# Patient Record
Sex: Female | Born: 1985 | Race: Black or African American | Hispanic: No | Smoking: Current every day smoker
Health system: Southern US, Community
[De-identification: ages and names within clinical notes are randomized; demographics above are authoritative.]

## PROBLEM LIST (undated history)

## (undated) ENCOUNTER — Inpatient Hospital Stay (HOSPITAL_COMMUNITY): Payer: Self-pay

## (undated) DIAGNOSIS — E282 Polycystic ovarian syndrome: Secondary | ICD-10-CM

## (undated) DIAGNOSIS — F32A Depression, unspecified: Secondary | ICD-10-CM

## (undated) DIAGNOSIS — G43909 Migraine, unspecified, not intractable, without status migrainosus: Secondary | ICD-10-CM

## (undated) DIAGNOSIS — F329 Major depressive disorder, single episode, unspecified: Secondary | ICD-10-CM

## (undated) DIAGNOSIS — A749 Chlamydial infection, unspecified: Secondary | ICD-10-CM

## (undated) DIAGNOSIS — R51 Headache: Secondary | ICD-10-CM

## (undated) DIAGNOSIS — Z720 Tobacco use: Secondary | ICD-10-CM

## (undated) DIAGNOSIS — N200 Calculus of kidney: Secondary | ICD-10-CM

## (undated) DIAGNOSIS — B999 Unspecified infectious disease: Secondary | ICD-10-CM

## (undated) DIAGNOSIS — G35 Multiple sclerosis: Secondary | ICD-10-CM

## (undated) DIAGNOSIS — M1990 Unspecified osteoarthritis, unspecified site: Secondary | ICD-10-CM

## (undated) DIAGNOSIS — B019 Varicella without complication: Secondary | ICD-10-CM

## (undated) DIAGNOSIS — R519 Headache, unspecified: Secondary | ICD-10-CM

## (undated) DIAGNOSIS — G629 Polyneuropathy, unspecified: Secondary | ICD-10-CM

## (undated) DIAGNOSIS — S8292XA Unspecified fracture of left lower leg, initial encounter for closed fracture: Secondary | ICD-10-CM

## (undated) HISTORY — DX: Polyneuropathy, unspecified: G62.9

## (undated) HISTORY — DX: Major depressive disorder, single episode, unspecified: F32.9

## (undated) HISTORY — DX: Migraine, unspecified, not intractable, without status migrainosus: G43.909

## (undated) HISTORY — PX: TIBIA FRACTURE SURGERY: SHX806

## (undated) HISTORY — DX: Chlamydial infection, unspecified: A74.9

## (undated) HISTORY — DX: Unspecified osteoarthritis, unspecified site: M19.90

## (undated) HISTORY — DX: Polycystic ovarian syndrome: E28.2

## (undated) HISTORY — DX: Multiple sclerosis: G35

## (undated) HISTORY — DX: Depression, unspecified: F32.A

## (undated) HISTORY — DX: Headache, unspecified: R51.9

## (undated) HISTORY — DX: Varicella without complication: B01.9

## (undated) HISTORY — DX: Headache: R51

---
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
## 2006-12-01 DIAGNOSIS — R519 Headache, unspecified: Secondary | ICD-10-CM | POA: Insufficient documentation

## 2006-12-01 DIAGNOSIS — J309 Allergic rhinitis, unspecified: Secondary | ICD-10-CM | POA: Insufficient documentation

## 2006-12-01 DIAGNOSIS — R51 Headache: Secondary | ICD-10-CM

## 2007-06-14 ENCOUNTER — Emergency Department (HOSPITAL_COMMUNITY): Admission: EM | Admit: 2007-06-14 | Discharge: 2007-06-15 | Payer: Self-pay | Admitting: Emergency Medicine

## 2007-10-02 ENCOUNTER — Emergency Department (HOSPITAL_COMMUNITY): Admission: EM | Admit: 2007-10-02 | Discharge: 2007-10-02 | Payer: Self-pay | Admitting: Emergency Medicine

## 2007-10-05 DIAGNOSIS — S8292XA Unspecified fracture of left lower leg, initial encounter for closed fracture: Secondary | ICD-10-CM

## 2007-10-05 HISTORY — DX: Unspecified fracture of left lower leg, initial encounter for closed fracture: S82.92XA

## 2008-06-11 ENCOUNTER — Emergency Department (HOSPITAL_COMMUNITY): Admission: EM | Admit: 2008-06-11 | Discharge: 2008-06-11 | Payer: Self-pay | Admitting: Emergency Medicine

## 2008-07-25 ENCOUNTER — Encounter: Payer: Self-pay | Admitting: Obstetrics and Gynecology

## 2008-07-25 ENCOUNTER — Ambulatory Visit: Payer: Self-pay | Admitting: Obstetrics and Gynecology

## 2008-07-25 LAB — CONVERTED CEMR LAB
Hgb A1c MFr Bld: 6.5 % — ABNORMAL HIGH (ref 4.6–6.1)
Prolactin: 3.9 ng/mL
Sex Hormone Binding: 16 nmol/L — ABNORMAL LOW (ref 18–114)
Testosterone Free: 18.4 pg/mL — ABNORMAL HIGH (ref 0.6–6.8)
WBC, Wet Prep HPF POC: NONE SEEN
hCG, Beta Chain, Quant, S: <2 milliintl units/mL

## 2008-07-29 ENCOUNTER — Ambulatory Visit (HOSPITAL_COMMUNITY): Admission: RE | Admit: 2008-07-29 | Discharge: 2008-07-29 | Payer: Self-pay | Admitting: Obstetrics & Gynecology

## 2008-08-05 ENCOUNTER — Emergency Department (HOSPITAL_COMMUNITY): Admission: EM | Admit: 2008-08-05 | Discharge: 2008-08-05 | Payer: Self-pay | Admitting: Family Medicine

## 2008-08-15 ENCOUNTER — Ambulatory Visit: Payer: Self-pay | Admitting: Obstetrics and Gynecology

## 2008-08-15 ENCOUNTER — Other Ambulatory Visit: Admission: RE | Admit: 2008-08-15 | Discharge: 2008-08-15 | Payer: Self-pay | Admitting: Obstetrics and Gynecology

## 2008-08-16 ENCOUNTER — Inpatient Hospital Stay (HOSPITAL_COMMUNITY): Admission: EM | Admit: 2008-08-16 | Discharge: 2008-08-19 | Payer: Self-pay | Admitting: Emergency Medicine

## 2008-08-28 ENCOUNTER — Ambulatory Visit: Payer: Self-pay | Admitting: Obstetrics & Gynecology

## 2008-09-24 ENCOUNTER — Encounter: Admission: RE | Admit: 2008-09-24 | Discharge: 2008-10-03 | Payer: Self-pay | Admitting: Orthopedic Surgery

## 2008-10-08 ENCOUNTER — Encounter (HOSPITAL_BASED_OUTPATIENT_CLINIC_OR_DEPARTMENT_OTHER): Admission: RE | Admit: 2008-10-08 | Discharge: 2008-12-05 | Payer: Self-pay | Admitting: Internal Medicine

## 2008-10-08 ENCOUNTER — Encounter: Admission: RE | Admit: 2008-10-08 | Discharge: 2009-01-06 | Payer: Self-pay | Admitting: Orthopedic Surgery

## 2009-01-03 ENCOUNTER — Emergency Department (HOSPITAL_COMMUNITY): Admission: EM | Admit: 2009-01-03 | Discharge: 2009-01-03 | Payer: Self-pay | Admitting: *Deleted

## 2009-03-05 ENCOUNTER — Ambulatory Visit: Payer: Self-pay | Admitting: Obstetrics and Gynecology

## 2009-03-30 ENCOUNTER — Emergency Department (HOSPITAL_COMMUNITY): Admission: EM | Admit: 2009-03-30 | Discharge: 2009-03-31 | Payer: Self-pay | Admitting: Emergency Medicine

## 2009-06-18 ENCOUNTER — Ambulatory Visit: Payer: Self-pay | Admitting: Obstetrics and Gynecology

## 2009-07-24 ENCOUNTER — Ambulatory Visit: Payer: Self-pay | Admitting: Obstetrics and Gynecology

## 2009-10-04 ENCOUNTER — Emergency Department (HOSPITAL_COMMUNITY): Admission: EM | Admit: 2009-10-04 | Discharge: 2009-10-04 | Payer: Self-pay | Admitting: Emergency Medicine

## 2010-11-03 ENCOUNTER — Emergency Department (HOSPITAL_COMMUNITY)
Admission: EM | Admit: 2010-11-03 | Discharge: 2010-11-03 | Payer: Self-pay | Source: Home / Self Care | Admitting: Emergency Medicine

## 2010-11-04 DIAGNOSIS — G44209 Tension-type headache, unspecified, not intractable: Secondary | ICD-10-CM

## 2010-12-19 LAB — GLUCOSE, CAPILLARY: Glucose-Capillary: 117 mg/dL — ABNORMAL HIGH (ref 70–99)

## 2011-01-12 ENCOUNTER — Emergency Department (HOSPITAL_COMMUNITY)
Admission: EM | Admit: 2011-01-12 | Discharge: 2011-01-12 | Disposition: A | Discharge status: Home / Self Care | Payer: Self-pay | Attending: Emergency Medicine | Admitting: Emergency Medicine

## 2011-01-12 DIAGNOSIS — J029 Acute pharyngitis, unspecified: Secondary | ICD-10-CM | POA: Insufficient documentation

## 2011-01-12 DIAGNOSIS — R079 Chest pain, unspecified: Secondary | ICD-10-CM | POA: Insufficient documentation

## 2011-01-12 DIAGNOSIS — E669 Obesity, unspecified: Secondary | ICD-10-CM | POA: Insufficient documentation

## 2011-01-12 DIAGNOSIS — J309 Allergic rhinitis, unspecified: Secondary | ICD-10-CM | POA: Insufficient documentation

## 2011-01-12 DIAGNOSIS — J3489 Other specified disorders of nose and nasal sinuses: Secondary | ICD-10-CM | POA: Insufficient documentation

## 2011-01-12 LAB — RAPID STREP SCREEN (MED CTR MEBANE ONLY): Streptococcus, Group A Screen (Direct): NEGATIVE

## 2011-02-16 NOTE — H&P (Signed)
Samantha [name], Samantha [name]             ACCOUNT NO.:  [id number]   MEDICAL RECORD NO.:  [id number]          PATIENT TYPE:  INP   LOCATION:  5029                         FACILITY:  [facility]   PHYSICIAN:  Alvy Beal, MD    DATE OF BIRTH:  07-Mar-1986   DATE OF ADMISSION:  08/16/2008  DATE OF DISCHARGE:                              HISTORY & PHYSICAL   ADMISSION DIAGNOSIS:  Left ankle fracture.   HISTORY:  This is a very pleasant 25 year old obese African American  woman who lost her footing and fell and presents with severe left ankle  pain.  The patient denied any loss of consciousness, blurry vision, or  headaches prior to or following the fall.  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  She now has pain at 7/10 and is quite  severe.  There is no obvious laceration, abrasion, or contusion about  the ankle region.  She states that about a week and half earlier she was  complaining of left knee pain and has not significantly changed.   PAST MEDICAL, SURGICAL, FAMILY, AND SOCIAL HISTORY:  No known drug  allergies.  She is currently taking metformin 100 mg p.o. 3 times a day.  She has a history of hypertension and diabetes.  She is a nondrinker and  nondrug abuser, but she smokes a few cigarettes daily.   She is not pregnant.  She indicates there is no way that she could be  pregnant.   CLINICAL PHYSICAL EXAM:  GENERAL:  She is a pleasant woman who appears  her stated age in no acute distress.  She is alert and oriented x3.  No  shortness of breath or chest pain.  LUNGS:  Fields are clear.  HEART:  Regular rate and rhythm.  ABDOMEN:  Soft and nontender.  No rebound or tenderness.  RIGHT LOWER EXTREMITY:  Asymptomatic.  Full range of motion.  No gross  crepitus, deformity, or pain.  No hip or knee pain with palpation or  range of motion.  She has significant tenderness and swelling about the  medial malleolus and midportion of the fibula.  NEUROVASCULAR:  Intact with  palpable and intact pulses bilaterally.  Cap  refill less than 2 seconds.  Moving all toes.   At this point in time, the patient's left lower extremity is placed into  a posterior splint, iced and elevated.  X-rays clearly show a medial  malleolar transverse fracture with displacement and oblique fracture the  medial malleolus displacement and mid to distal third fibula fracture.  There is no gross widening of the syndesmosis.   At this point in time, I discussed treatment options with the patient,  which include cast immobilization versus surgery.  I went over the  risks, benefits, and alternatives to both.  After discussing treatment  options, she elected to proceed with surgery.  As I explained to her and  her husband, the risks include infection, bleeding, nerve damage,  death, stroke, paralysis, failure to heal, need for further surgery,  ongoing or worse pain, and nonunion.  All their questions were  addressed.  We will plan on admitting the patient and then at 7:30  tomorrow morning doing an ORIF of the ankle.      Alvy Beal, MD  Electronically Signed     DDB/MEDQ  D:  08/16/2008  T:  08/17/2008  Job:  [id number]

## 2011-02-16 NOTE — Op Note (Signed)
NAMECOREY, CAULFIELD             ACCOUNT NO.:  [id number]   MEDICAL RECORD NO.:  [id number]          PATIENT TYPE:  INP   LOCATION:  5029                         FACILITY:  [facility]   PHYSICIAN:  Alvy Beal, MD    DATE OF BIRTH:  10/24/1985   DATE OF PROCEDURE:  08/17/2008  DATE OF DISCHARGE:                               OPERATIVE REPORT   PREOPERATIVE DIAGNOSIS:  Left bimalleolar ankle fracture.   POSTOPERATIVE DIAGNOSIS:  Left bimalleolar ankle fracture.   OPERATIVE PROCEDURE:  Open reduction and internal fixation of the ankle.   INSTRUMENTATION USED:  Two medial cannulated screws with a washer 48 and  42 mm in length and a 7-hole one-third tubular plate on the lateral side  with 6 cortical screws, and the most inferior screw was used as a  syndesmotic screw, 38 mm in length as there was disruption of  syndesmosis.   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  She was diagnosed with a bimalleolar ankle  fracture, Weber type C.  After discussing treatment options, she elected  to proceed with surgery.  All appropriate risks, benefits, and  alternatives were discussed, and consent was obtained.   OPERATIVE NOTE:  The patient was brought to the operating theater and  placed supine on the operating table.  After successful induction of  general anesthesia and endotracheal intubation, the tourniquet was  placed on the left proximal thigh and a bump was placed underneath the  left buttock.  The leg was appropriately identified.  Preoperative time-  out was done, and the leg was then prepped and draped in a standard  fashion.  A lateral incision was made in line with the fibula, and sharp  dissection was carried out down to the lateral aspect of the fibula.  The fracture was identified.  The enfolded periosteum was removed with a  15 blade, and the hematoma was evacuated with lavage.  I then reduced  the fracture and attempted to use  a bulky plate.  However, I could not  maintain the reduction of the fracture clear and placed a bulky locking  plate.  I then removed this and placed an one-third tubular plate, and I  was able to maintain the reduction and get the plate properly fixed.  I  then secured it to the fibula with 12 and 14-mm cortical screws, which  were all satisfactory.  I had excellent fixation of the lateral side.  However, under direct visualization and under live fluoro, I could move  the fibula superiorly, anteriorly, and posteriorly (in the sagittal  plane), and I could pull it, and she had a positive hook test.  This  collectively let me know that more than likely I would need to repair  the syndesmosis.  However, before doing this, I wanted to repair the  medial malleolus.   I then turned my attention to the medial side.   A reverse hockey stick incision was made, and sharp dissection was  carried out down to the medial malleolus.  I protected the neurovascular  structures and  then identified the fracture site.  I cleaned the  hematoma with a curette, resected the enfolded periosteum with a 15-  blade scalpel.  I then reduced the medial malleolar fragment with a  towel clip and placed 2 guide pins across the fracture site.  I was able  to visualize the anterior joint line and axial, and it had an anatomical  reduction.  I then drilled and placed a 40-mm lag screw with a washer  anteriorly and then a 42-mm screw with a washer posteriorly.  I removed  the clamp, and I had well maintained fixation.  The medial joint line  was anatomically reduced.  X-rays confirmed that the 2 screws on the  medial side were adequately positioned, and they were not  intraarticular.   With the medial fixation complete, I then retested the fibula, and  again, there was still some instability in the sagittal plane and a  positive hook test.  I then removed the most distal screw.  I then  measured from the joint line  proximally 2.5 cm and then placed a  syndesmotic screw across the fibula and into the tibia.  This was 3  cortices.  The port was placed into neutral, slight dorsiflexion during  the insertion, and I did not use a lag screw to prevent overtightening.  Once placed the syndesmotic screw, the fibula was stable.  At this  point, I irrigated both wounds, closed the deep fascia with interrupted  2-0 Vicryl sutures, and superficial with a running 3-0 vertical  mattress.  A dry dressing and bulky splint were applied.  She was  extubated and transferred to the PACU without incident.  At the end of  the case, all needle and sponge counts were correct.      Alvy Beal, MD  Electronically Signed     DDB/MEDQ  D:  08/17/2008  T:  08/17/2008  Job:  [id number]

## 2011-02-16 NOTE — Group Therapy Note (Signed)
Samantha [name], Samantha [name] NO.:  [id number]   MEDICAL RECORD NO.:  [id number]          PATIENT TYPE:  WOC   LOCATION:  [facility]                   FACILITY:  [facility]   PHYSICIAN:  Argentina Donovan, MD        DATE OF BIRTH:  22-Aug-1986   DATE OF SERVICE:                                  CLINIC NOTE   The patient is a 25 year old African American female, nulligravida who  has been attempting for more than a year to get pregnancy.  She weighs  254 pounds.  She is 5 feet 5 inches tall.  Recently, we did an  ultrasound, which revealed a thickened endometrial 1.4 cm.  The patient  had been amenorrheic for some time.  Both ovaries were normal with  exception of many small follicles indicating the possibility of  polycystic ovarian syndrome, so we did an endometrial biopsy sounded the  uterus to 6 cm anteriorly and biopsy was done without incident.  In  addition to this, I have explained the temperature chart to the patient.  She is going to start in about a month with that.  We are placing her on  metformin 500 t.i.d. with meals and after a couple of months on that if  she is not spontaneously ovulating, we will start her on Clomid.  We  have told her that following a pregnancy at the end when and if she gets  pregnant, we would want her to be on oral contraceptives probably for  life.  She smokes irregularly.  I have told her that is going to stop  and she is willing to try and stop smoking.   IMPRESSION:  Polycystic ovarian syndrome, i.e. amenorrhea, polycystic  ovaries and a free testosterone of 18.4, a testosterone of 70.87, a sex  hormone-binding globulin of 16, and hemoglobin A1c of 6.5.  All other  tests were normal.  Glucose intolerance.           ______________________________  Argentina Donovan, MD     PR/MEDQ  D:  08/15/2008  T:  08/16/2008  Job:  [id number]

## 2011-02-16 NOTE — Group Therapy Note (Signed)
NAMEEVONDA, ENGE NO.:  [id number]   MEDICAL RECORD NO.:  [id number]          PATIENT TYPE:  WOC   LOCATION:  [facility]                   FACILITY:  [facility]   PHYSICIAN:  Argentina Donovan, MD        DATE OF BIRTH:  Mar 16, 1986   DATE OF SERVICE:                                  CLINIC NOTE   HISTORY:  The patient is a 25 year old African American female  nulligravida with the same partner 3 years, been married 1 year and has  been attempting more than a year to get pregnant.  She weighs 254  pounds.  She is 5 feet 5 inches tall.  She has a normal blood pressure  of 126/87.  She had an abdominal hirsutism to some degree and she has  been amenorrheic she says for at least 3 years, although she has had  some intermittent spotting.   Today on examination, the abdomen was soft, nontender.  No masses or  organomegaly.  External genitalia was normal.  BUS within normal limits.  Vagina is clean and well rugated.  Cervix is clean, nulliparous and  posterior.  The uterus and adnexa could not be palpated because of the  habitus of the patient.   I have talked to this patient about her condition.  She probably has  polycystic ovarian syndrome.  We are going to get some labs on her and  an ultrasound of the pelvis to look at her endometrium as well as the  ovaries and have her come back in 2 weeks.  Meanwhile, we are going to  give her a progesterone challenge test.  When she comes back, I told her  we would get a temperature chart.  We discussed that after we went over  her laboratories.  Probably get her started on clomiphene citrate which  I described how that works and side effects that could be possible with  that.   IMPRESSION:  Polycystic ovarian syndrome with 3 years of amenorrhea and  hirsutism.           ______________________________  Argentina Donovan, MD     PR/MEDQ  D:  07/25/2008  T:  07/25/2008  Job:  [id number]

## 2011-02-16 NOTE — Assessment & Plan Note (Signed)
[facility]   NAME:  Samantha [name], Samantha [name]             ACCOUNT NO.:  [id number]   MEDICAL RECORD NO.:  [id number]      DATE OF BIRTH:  1986-07-09   PHYSICIAN:  Maxwell Caul, M.D.      VISIT DATE:                                   OFFICE VISIT   IDENTIFICATION:  [name] is a 25 year old woman who fell while doing  housework at home.  Unfortunately, she developed a left bimalleolar  fracture for which she underwent ORIF by Dr. Shon Baton on August 17, 2008.  She tells me that the surgical wound on the lateral aspect of her  ankle healed quite well.  However, the wound on the medial aspect of the  left ankle initially did well, but she has been left with a small  crescent-shaped open area on the medial aspect of the ankle.  This has  not closed.  She did have a course of antibiotics in late November early  December but has had none since.  She is not aware of any cultures.  The  x-rays of the area by Dr. Shon Baton on September 10, 2008, showed good  placement of the hardware, this has not moved or shifted.  At one point,  she was using Silvadene cream on these wounds.  I think more recently  she has simply been using saline gauze.   She does describe some greenish drainage that is episodically coming out  of this wound when she changes it.  She has not been running a fever.  She is not systemically unwell.  She is a diabetic, on metformin but has  no other medical issues that should preclude healing.   PAST MEDICAL HISTORY:  Type 2 diabetes mellitus, on metformin.  No other  issues.   SOCIAL HISTORY:  Lives at home with her husband.   On examination, there is a small crescent-shaped linear wound in the  surgical line on the medial aspect of her ankle.  She measures 1.1 x  0.3, the depth of this would be roughly 0.2.  I did do a light  debridement of the surface eschar of this wound.  I must say I did not  see any evidence of infection.  There was nothing that  looked like it  needed culturing, although I did do a blind culture of the base of this  wound.  Surrounding tissue is somewhat discolored, but I think this is  where the wound was initially.  There was no overt evidence of  infection.  The base of this wound looks fairly healthy to me.   IMPRESSION:  1. Surgical wound, nonhealing in a type 2 diabetic without evidence of      ischemia.  I did a culture of this wound and the debridement as      noted above.  As she is a young woman with really good healing      potential, I simply advised antibacterial soap washes, topical      bacitracin that she will change b.i.d..  We put a dry dressing over      this and an Ace wrap.  She will continue in a Cam walker for      protection of the wound and her ankle healing.  If this is      insufficient, I would consider a collagen-based silver dressing      and/or Oasis.  I am hopeful that this will not be a difficult wound      to close over.           ______________________________  Maxwell Caul, M.D.     MGR/MEDQ  D:  10/11/2008  T:  10/11/2008  Job:  [id number]

## 2011-02-16 NOTE — Assessment & Plan Note (Signed)
[facility]   NAMECALINDA, [name]             ACCOUNT NO.:  [id number]   MEDICAL RECORD NO.:  [id number]      DATE OF BIRTH:  [birth year]   PHYSICIAN:  Lenon Curt. Chilton Si, M.D.   VISIT DATE:  10/18/2008                                   OFFICE VISIT   HISTORY:  A 25 year old female has been [facility] for ulcer that was nonhealing following surgery on the  left malleolar fracture.  The patient has been applying bacitracin and  Ace wrap twice daily.  Wound is doing very well, and she has no  complaints other than mild residual tenderness.  No signs of infection.   PHYSICAL EXAMINATION:  Temp 98.0, pulse 75, respirations 20, blood  pressure 129/81.  Wound measurements are now 0.6 x 0.2 x 0.2 at the left  medial ankle.  Wound is shrinking and is closing up.  No surrounding  erythema.  Minimal tenderness with palpation of the wound.   TREATMENT:  Applied bacitracin and Ace wrap.  The patient will continue  do this daily until full wound healing occurs.  She is discharged from  clinic.  She is advised to continue current dressing changes daily until  fully healed with an estimation this will take about 7-10 days.   ICD-9 CODE:  990.83, nonhealing surgical wound.   CPT CODE:  78295.      Lenon Curt Chilton Si, M.D.  Electronically Signed     AGG/MEDQ  D:  10/18/2008  T:  10/18/2008  Job:  [id number]

## 2011-02-16 NOTE — Group Therapy Note (Signed)
Samantha [name], WOLLENBERG NO.:  [id number]   MEDICAL RECORD NO.:  [id number]          PATIENT TYPE:  WOC   LOCATION:  [facility]                   FACILITY:  [facility]   PHYSICIAN:  Argentina Donovan, MD        DATE OF BIRTH:  1985-12-09   DATE OF SERVICE:  03/05/2009                                  CLINIC NOTE   The patient is a 25 year old nulligravida African American female with  polycystic ovarian syndrome who has been on metformin for 6 months  without having a period.  She had an endometrial biopsy last November  which showed no sign of hyperplasia.  The ultrasound she had was normal.  She weighs 237, is 5 feet 5 inches tall.  I am going to start her on  Clomid and give a prescription for Provera 10 mg b.i.d. for 5 days  followed by 15 mg of Clomid for 5 days, day five through nine of cycle.  She is on a temperature chart which she will be starting up with the  onset of her period.  She will come in if she has not had a period 30  days after taking the Clomid and if her temperature stays up for more  than 20 days above 98 she will get a pregnancy test.   IMPRESSION:  Polycystic ovarian syndrome with amenorrhea, attempting  pregnancy.           ______________________________  Argentina Donovan, MD     PR/MEDQ  D:  03/05/2009  T:  03/05/2009  Job:  [id number]

## 2011-02-16 NOTE — Discharge Summary (Signed)
Samantha [name], Samantha [name]             ACCOUNT NO.:  [id number]   MEDICAL RECORD NO.:  [id number]           PATIENT TYPE:   LOCATION:                                 FACILITY:   PHYSICIAN:  Alvy Beal, MD    DATE OF BIRTH:  03-02-1986   DATE OF ADMISSION:  DATE OF DISCHARGE:                               DISCHARGE SUMMARY   ADMISSION DIAGNOSIS:  Bimalleolar fracture.   DISCHARGE DIAGNOSIS:  Bimalleolar fracture.   PROCEDURE:  Open reduction internal fixation of the tibia and fibula and  open reduction of the ankle dislocation.   BRIEF HISTORY:  Samantha [name] is a very pleasant 25 year old obese African  American female who lost her footing and fell on August 16, 2008, and  presented to the emergency room with a severe left ankle pain.  At the  time of the injury, the patient denied any loss of consciousness,  blurred vision, or headaches prior to or following the fall.  As she was  mopping at work, slipped on the floor, and fell.  She now has pain at  approximately 7/10 and it was quite severe.  There was no obvious  laceration, abrasion, or contusion about the ankle region.  In the  emergency room, radiographs were obtained that demonstrated a  bimalleolar fracture, and therefore, Dr. Shon Baton was consulted for  further orthopedic management.  After a long discussion with the patient  and the husband that they elected to proceed with surgery.   HOSPITAL COURSE:  The patient's hospital course was approximately 3 days  in length.  The patient was brought back to the operating room on  August 17, 2008, to undergo the left bimalleolar open reduction and  internal fixation of her ankle.  The patient tolerated the procedure  very well and was transferred from the OR to the PACU without incident  and furthermore up to the orthopedic floor.  The patient postoperatively  on day #1 was working well with physical therapy.  She was ambulating on  crutches.  Her pain was well controlled  throughout her stay.  She had no  calf tenderness throughout her stay.  She had no shortness of breath and  no chest pain throughout her stay.  Postoperatively on day #2, hospital  stay #3, the patient was doing very well and deemed stable to go home.  She is tolerating a regular diet and at that time was fit for a Cam  walker boot and instructed on nonweightbearing ambulation with the use  of crutches.   The patient's discharge condition is stable.   DISPOSITION:  The patient is discharged to home with a home health aide,  however, I felt from poor financial reasons, the patient was unable to  afford the home health aide.  The patient was instructed on the use of  crutches without putting weight on her left leg.  The patient's  discharge medications included Percocet 5/325 one tablet p.o. q.6 h. as  needed for pain, Robaxin 500 mg 1 tablet p.o. q.6-8 h. p.r.n. muscle  spasm, and the patient is to restart her home medications,  which  included metformin 500 mg t.i.d. and the patient was instructed not to  take her over-the-counter Motrin as needed until she follows up with Dr.  Shon Baton.   DISCHARGE INSTRUCTIONS:  Again, the patient is to remain  nonweightbearing on her left side.  She is to ambulate with the use of  crutches or a walker.  The patient is to maintain on regular diet, and  the patient is to call the office if her dressing gets wet at all.   FOLLOWUP:  The patient is to follow up with Dr. Shon Baton in the office in  approximately 2 weeks.  The patient is to call and schedule her  appointment at [phone].      Crissie Reese, PA      Alvy Beal, MD  Electronically Signed    AC/MEDQ  D:  10/08/2008  T:  10/09/2008  Job:  [id number]

## 2011-04-06 ENCOUNTER — Emergency Department (HOSPITAL_COMMUNITY)
Admission: EM | Admit: 2011-04-06 | Discharge: 2011-04-06 | Disposition: A | Discharge status: Home / Self Care | Payer: No Typology Code available for payment source | Attending: Emergency Medicine | Admitting: Emergency Medicine

## 2011-04-06 ENCOUNTER — Emergency Department (HOSPITAL_COMMUNITY): Payer: No Typology Code available for payment source

## 2011-04-06 DIAGNOSIS — F29 Unspecified psychosis not due to a substance or known physiological condition: Secondary | ICD-10-CM | POA: Insufficient documentation

## 2011-04-06 DIAGNOSIS — R112 Nausea with vomiting, unspecified: Secondary | ICD-10-CM | POA: Insufficient documentation

## 2011-04-06 DIAGNOSIS — N200 Calculus of kidney: Secondary | ICD-10-CM | POA: Insufficient documentation

## 2011-04-06 DIAGNOSIS — Z79899 Other long term (current) drug therapy: Secondary | ICD-10-CM | POA: Insufficient documentation

## 2011-04-06 DIAGNOSIS — R109 Unspecified abdominal pain: Secondary | ICD-10-CM | POA: Insufficient documentation

## 2011-04-06 LAB — URINALYSIS, ROUTINE W REFLEX MICROSCOPIC
Glucose, UA: NEGATIVE mg/dL
Leukocytes, UA: NEGATIVE
Specific Gravity, Urine: 1.031 — ABNORMAL HIGH (ref 1.005–1.030)
pH: 5 (ref 5.0–8.0)

## 2011-04-06 LAB — CBC
HCT: 37.4 % (ref 36.0–46.0)
Hemoglobin: 12.8 g/dL (ref 12.0–15.0)
MCHC: 34.2 g/dL (ref 30.0–36.0)
RBC: 4.58 MIL/uL (ref 3.87–5.11)
WBC: 9.9 10*3/uL (ref 4.0–10.5)

## 2011-04-06 LAB — BASIC METABOLIC PANEL
CO2: 25 mEq/L (ref 19–32)
Chloride: 101 mEq/L (ref 96–112)
Glucose, Bld: 145 mg/dL — ABNORMAL HIGH (ref 70–99)
Potassium: 3.6 mEq/L (ref 3.5–5.1)
Sodium: 137 mEq/L (ref 135–145)

## 2011-04-06 LAB — HEPATIC FUNCTION PANEL
Bilirubin, Direct: <0.1 mg/dL (ref 0.0–0.3)
Total Protein: 8.4 g/dL — ABNORMAL HIGH (ref 6.0–8.3)

## 2011-04-06 LAB — URINE MICROSCOPIC-ADD ON

## 2011-04-06 LAB — LIPASE, BLOOD: Lipase: 19 U/L (ref 11–59)

## 2011-04-06 LAB — DIFFERENTIAL
Basophils Absolute: 0.1 10*3/uL (ref 0.0–0.1)
Lymphocytes Relative: 18 % (ref 12–46)
Monocytes Absolute: 0.5 10*3/uL (ref 0.1–1.0)
Neutro Abs: 7.4 10*3/uL (ref 1.7–7.7)

## 2011-05-08 ENCOUNTER — Inpatient Hospital Stay (INDEPENDENT_AMBULATORY_CARE_PROVIDER_SITE_OTHER)
Admission: RE | Admit: 2011-05-08 | Discharge: 2011-05-08 | Disposition: A | Discharge status: Home / Self Care | Payer: No Typology Code available for payment source | Source: Ambulatory Visit | Attending: Emergency Medicine | Admitting: Emergency Medicine

## 2011-05-08 DIAGNOSIS — M79609 Pain in unspecified limb: Secondary | ICD-10-CM

## 2011-06-08 ENCOUNTER — Inpatient Hospital Stay (INDEPENDENT_AMBULATORY_CARE_PROVIDER_SITE_OTHER)
Admission: RE | Admit: 2011-06-08 | Discharge: 2011-06-08 | Disposition: A | Discharge status: Home / Self Care | Payer: No Typology Code available for payment source | Source: Ambulatory Visit | Attending: Emergency Medicine | Admitting: Emergency Medicine

## 2011-06-08 DIAGNOSIS — J018 Other acute sinusitis: Secondary | ICD-10-CM

## 2011-07-06 LAB — POCT CARDIAC MARKERS
CKMB, poc: 1.2
Myoglobin, poc: 154
Troponin i, poc: <0.05

## 2011-07-06 LAB — POCT I-STAT, CHEM 8
Hemoglobin: 12.6
Potassium: 3.8
Sodium: 140
TCO2: 25

## 2011-07-06 LAB — POCT PREGNANCY, URINE: Preg Test, Ur: NEGATIVE

## 2011-07-06 LAB — DIFFERENTIAL
Basophils Absolute: 0
Lymphocytes Relative: 10 — ABNORMAL LOW
Lymphs Abs: 1.1
Monocytes Absolute: 0.5
Neutro Abs: 8.7 — ABNORMAL HIGH

## 2011-07-06 LAB — GLUCOSE, CAPILLARY
Glucose-Capillary: 116 — ABNORMAL HIGH
Glucose-Capillary: 123 — ABNORMAL HIGH
Glucose-Capillary: 124 — ABNORMAL HIGH
Glucose-Capillary: 139 — ABNORMAL HIGH

## 2011-07-06 LAB — CBC
Hemoglobin: 11.9 — ABNORMAL LOW
RBC: 4.23
RDW: 14.6

## 2011-07-06 LAB — PROTIME-INR: Prothrombin Time: 14

## 2011-07-06 LAB — APTT: aPTT: 27

## 2011-07-07 LAB — POCT URINALYSIS DIP (DEVICE)
Bilirubin Urine: NEGATIVE
Glucose, UA: NEGATIVE
Nitrite: NEGATIVE
Operator id: 235561
pH: 6.5

## 2011-07-07 LAB — POCT PREGNANCY, URINE: Preg Test, Ur: NEGATIVE

## 2011-07-16 LAB — CBC
Hemoglobin: 14
RDW: 14.7 — ABNORMAL HIGH

## 2011-07-16 LAB — DIFFERENTIAL
Basophils Absolute: 0
Lymphocytes Relative: 29
Monocytes Absolute: 0.4
Neutro Abs: 5.1

## 2011-07-16 LAB — URINALYSIS, ROUTINE W REFLEX MICROSCOPIC
Nitrite: NEGATIVE
Specific Gravity, Urine: 1.031 — ABNORMAL HIGH
pH: 6.5

## 2011-07-16 LAB — I-STAT 8, (EC8 V) (CONVERTED LAB)
Acid-Base Excess: 4 — ABNORMAL HIGH
Bicarbonate: 29.6 — ABNORMAL HIGH
HCT: 48 — ABNORMAL HIGH
Operator id: 277751
pCO2, Ven: 47.9

## 2011-07-16 LAB — POCT PREGNANCY, URINE
Operator id: 277751
Preg Test, Ur: NEGATIVE

## 2011-09-14 ENCOUNTER — Encounter: Payer: Self-pay | Admitting: Emergency Medicine

## 2011-09-14 ENCOUNTER — Emergency Department (HOSPITAL_COMMUNITY)
Admission: EM | Admit: 2011-09-14 | Discharge: 2011-09-14 | Disposition: A | Discharge status: Home / Self Care | Payer: No Typology Code available for payment source | Attending: Emergency Medicine | Admitting: Emergency Medicine

## 2011-09-14 DIAGNOSIS — S139XXA Sprain of joints and ligaments of unspecified parts of neck, initial encounter: Secondary | ICD-10-CM | POA: Insufficient documentation

## 2011-09-14 DIAGNOSIS — M542 Cervicalgia: Secondary | ICD-10-CM | POA: Insufficient documentation

## 2011-09-14 DIAGNOSIS — S161XXA Strain of muscle, fascia and tendon at neck level, initial encounter: Secondary | ICD-10-CM

## 2011-09-14 DIAGNOSIS — X500XXA Overexertion from strenuous movement or load, initial encounter: Secondary | ICD-10-CM | POA: Insufficient documentation

## 2011-09-14 MED ORDER — DIAZEPAM 5 MG PO TABS
5.0000 mg | ORAL_TABLET | Freq: Once | ORAL | Status: AC
  Administered 2011-09-14: 5 mg via ORAL
  Filled 2011-09-14: qty 1

## 2011-09-14 MED ORDER — IBUPROFEN 400 MG PO TABS: 400.0000 mg | ORAL_TABLET | Freq: Three times a day (TID) | ORAL | Status: AC | PRN

## 2011-09-14 MED ORDER — IBUPROFEN 800 MG PO TABS
800.0000 mg | ORAL_TABLET | Freq: Once | ORAL | Status: AC
  Administered 2011-09-14: 800 mg via ORAL
  Filled 2011-09-14: qty 1

## 2011-09-14 MED ORDER — HYDROCODONE-ACETAMINOPHEN 5-325 MG PO TABS
1.0000 | ORAL_TABLET | Freq: Once | ORAL | Status: AC
  Administered 2011-09-14: 1 via ORAL
  Filled 2011-09-14: qty 1

## 2011-09-14 MED ORDER — DIAZEPAM 5 MG PO TABS: 5.0000 mg | ORAL_TABLET | Freq: Three times a day (TID) | ORAL | Status: AC | PRN

## 2011-09-14 MED ORDER — HYDROCODONE-ACETAMINOPHEN 5-325 MG PO TABS: 1.0000 | ORAL_TABLET | Freq: Four times a day (QID) | ORAL | Status: AC | PRN

## 2011-09-14 NOTE — ED Provider Notes (Signed)
History     CSN: [id number] Arrival date & time: 09/14/2011  8:06 PM   First MD Initiated Contact with Patient 09/14/11 2035      Chief Complaint  Patient presents with  . Neck Pain     Patient is a 25 y.o. female presenting with neck pain. The history is provided by the patient.  Neck Pain  This is a new problem. The current episode started 6 to 12 hours ago. The problem occurs constantly. The problem has been gradually worsening. The pain is associated with twisting. There has been no fever. The pain is present in the left side. The pain radiates to the left shoulder.  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 States she had her head turned to the (R) while drying hair and had the sudden sharp pain. States since that time (L) neck is very TTP and it hurts to turn her neck to the (L). Denies any neurological symptoms.    History reviewed. No pertinent past medical history.  Past Surgical History  Procedure Date  . Tibia fracture surgery     Family History  Problem Relation Age of Onset  . Hypertension Other   . Diabetes Other     History  Substance Use Topics  . Smoking status: Former Games developer  . Smokeless tobacco: Not on file  . Alcohol Use: No    OB History    Grav Para Term Preterm Abortions TAB SAB Ect Mult Living                  Review of Systems  Constitutional: Negative.   HENT: Positive for neck pain.   Eyes: Negative.   Respiratory: Negative.   Cardiovascular: Negative.   Gastrointestinal: Negative.   Genitourinary: Negative.   Skin: Negative.   Neurological: Negative.   Hematological: Negative.   Psychiatric/Behavioral: Negative.     Allergies  Review of patient's allergies indicates no known allergies.  Home Medications  No current outpatient prescriptions on file.  BP 124/83  Pulse 91  Temp(Src) 98.2 F (36.8 C) (Oral)  Resp 16  SpO2 98%  LMP 09/07/2011  Physical Exam   Constitutional: She is oriented to person, place, and time. She appears well-developed and well-nourished.  HENT:  Head: Normocephalic and atraumatic.  Eyes: Conjunctivae are normal.  Neck: Neck supple. Muscular tenderness present. No spinous process tenderness present.    Cardiovascular: Normal rate and regular rhythm.   Pulmonary/Chest: Effort normal and breath sounds normal.  Abdominal: Soft. Bowel sounds are normal.  Musculoskeletal: Normal range of motion.  Neurological: She is alert and oriented to person, place, and time. She has normal strength. No cranial nerve deficit.  Skin: Skin is warm and dry.  Psychiatric: She has a normal mood and affect.    ED Course  Procedures Impression discussed w/ pt. Will d/c home w/ tx for cervical strain and encourage f/u w/ PCP if pain persist. Pt is agreeable w/ plan.  Labs Reviewed - No data to display No results found.   No diagnosis found.    MDM  HPI and PE c/w cervical muscle strain.        Leanne Chang, NP 09/14/11 2125

## 2011-09-14 NOTE — ED Notes (Signed)
Pt states she was drying her hair and got a sharp pain in her neck  Pt states pain was worse this morning but has gotten better throughout the day  Pt states when she uses her right hand it makes the pain worse  Pt states the pain is on the left side of her neck

## 2011-09-15 NOTE — ED Provider Notes (Signed)
Medical screening examination/treatment/procedure(s) were performed by non-physician practitioner and as supervising physician I was immediately available for consultation/collaboration.   Dallas Torok, MD 09/15/11 0054 

## 2011-10-13 ENCOUNTER — Ambulatory Visit (INDEPENDENT_AMBULATORY_CARE_PROVIDER_SITE_OTHER): Payer: No Typology Code available for payment source | Admitting: Obstetrics & Gynecology

## 2011-10-13 ENCOUNTER — Encounter: Payer: Self-pay | Admitting: Obstetrics & Gynecology

## 2011-10-13 VITALS — BP 149/103 | HR 101 | Temp 96.6°F | Resp 20 | Ht 65.0 in | Wt 255.0 lb

## 2011-10-13 DIAGNOSIS — Z0000 Encounter for general adult medical examination without abnormal findings: Secondary | ICD-10-CM

## 2011-10-13 DIAGNOSIS — E282 Polycystic ovarian syndrome: Secondary | ICD-10-CM

## 2011-10-13 DIAGNOSIS — Z23 Encounter for immunization: Secondary | ICD-10-CM

## 2011-10-13 DIAGNOSIS — N938 Other specified abnormal uterine and vaginal bleeding: Secondary | ICD-10-CM

## 2011-10-13 DIAGNOSIS — N949 Unspecified condition associated with female genital organs and menstrual cycle: Secondary | ICD-10-CM

## 2011-10-13 MED ORDER — METFORMIN HCL 500 MG PO TABS: ORAL_TABLET | ORAL | Status: DC

## 2011-10-13 NOTE — Progress Notes (Signed)
Addended by: Allie Bossier on: 10/13/2011 04:22 PM   Modules accepted: Orders

## 2011-10-13 NOTE — Progress Notes (Signed)
Subjective:    Samantha [name] is a 26 y.o. female who presents for an annual exam. The patient has complaints consistent with PCOS- unwanted hair growth, DUB, difficulty conceiving. The patient is sexually active. GYN screening history: normal pap in 2009. The patient wears seatbelts: yes. The patient participates in regular exercise: no. Has the patient ever been transfused or tattooed?: not asked. The patient reports that there is not domestic violence in her life.   Menstrual History: OB History    Grav Para Term Preterm Abortions TAB SAB Ect Mult Living   0               Menarche age: Patient's last menstrual period was 09/07/2011.    The following portions of the patient's history were reviewed and updated as appropriate: allergies, current medications, past family history, past medical history, past social history, past surgical history and problem list.  Review of Systems A comprehensive review of systems was negative.  She works part time in childcare. She complains of dysparunia. She has been having unprotected intercourse for 5 years with her husband.   Objective:    BP 149/103  Pulse 101  Temp(Src) 96.6 F (35.9 C) (Oral)  Resp 20  Ht 5\' 5"  (1.651 m)  Wt 255 lb (115.667 kg)  BMI 42.43 kg/m2  LMP 09/07/2011  General Appearance:    Alert, cooperative, no distress, appears stated age  Head:    Normocephalic, without obvious abnormality, atraumatic  Eyes:    PERRL, conjunctiva/corneas clear, EOM's intact, fundi    benign, both eyes  Ears:    Normal TM's and external ear canals, both ears  Nose:   Nares normal, septum midline, mucosa normal, no drainage    or sinus tenderness  Throat:   Lips, mucosa, and tongue normal; teeth and gums normal  Neck:   Supple, symmetrical, trachea midline, no adenopathy;    thyroid:  no enlargement/tenderness/nodules; no carotid   bruit or JVD  Back:     Symmetric, no curvature, ROM normal, no CVA tenderness  Lungs:     Clear to  auscultation bilaterally, respirations unlabored  Chest Wall:    No tenderness or deformity   Heart:    Regular rate and rhythm, S1 and S2 normal, no murmur, rub   or gallop  Breast Exam:    No tenderness, masses, or nipple abnormality  Abdomen:     Soft, non-tender, bowel sounds active all four quadrants,    no masses, no organomegaly  Genitalia:    Normal female without lesion, discharge or tenderness, normal exam     Extremities:   Extremities normal, atraumatic, no cyanosis or edema  Pulses:   2+ and symmetric all extremities  Skin:   Skin color, texture, turgor normal, no rashes or lesions  Lymph nodes:   Cervical, supraclavicular, and axillary nodes normal  Neurologic:   CNII-XII intact, normal strength, sensation and reflexes    throughout  .    Assessment:    Healthy female exam.  Probable DM Probable PCOS   Plan:     Pap smear.  I'm also ordering labs for eval of PCOS, and cervical cultures I will start glucophage and get to 500 mg TID She will also come back fasting in 2 months for fasting lipids

## 2011-10-26 ENCOUNTER — Telehealth: Payer: Self-pay | Admitting: *Deleted

## 2011-10-26 ENCOUNTER — Ambulatory Visit: Payer: Self-pay

## 2011-10-26 DIAGNOSIS — A749 Chlamydial infection, unspecified: Secondary | ICD-10-CM

## 2011-10-26 NOTE — Telephone Encounter (Signed)
Called pt to inform her of test results and need for treatment. She stated that she is here at the clinic. No further instructions given.

## 2011-10-26 NOTE — Telephone Encounter (Signed)
Message copied by Jill Side on Tue Oct 26, 2011 10:56 AM ------      Message from: Allie Bossier      Created: Tue Oct 26, 2011  9:09 AM       She needs zithromax 1 gram po once and her partner(s) need to go to the health dept for their treatment. She will need a test of cure in 1-2 months.

## 2011-12-02 ENCOUNTER — Ambulatory Visit (INDEPENDENT_AMBULATORY_CARE_PROVIDER_SITE_OTHER): Payer: Self-pay | Admitting: Physician Assistant

## 2011-12-02 ENCOUNTER — Encounter: Payer: Self-pay | Admitting: Physician Assistant

## 2011-12-02 DIAGNOSIS — Z0000 Encounter for general adult medical examination without abnormal findings: Secondary | ICD-10-CM

## 2011-12-02 DIAGNOSIS — A749 Chlamydial infection, unspecified: Secondary | ICD-10-CM

## 2011-12-02 DIAGNOSIS — Z01419 Encounter for gynecological examination (general) (routine) without abnormal findings: Secondary | ICD-10-CM

## 2011-12-02 DIAGNOSIS — A7489 Other chlamydial diseases: Secondary | ICD-10-CM

## 2011-12-02 DIAGNOSIS — Z202 Contact with and (suspected) exposure to infections with a predominantly sexual mode of transmission: Secondary | ICD-10-CM

## 2011-12-02 DIAGNOSIS — Z23 Encounter for immunization: Secondary | ICD-10-CM

## 2011-12-02 HISTORY — DX: Chlamydial infection, unspecified: A74.9

## 2011-12-02 LAB — POCT PREGNANCY, URINE: Preg Test, Ur: NEGATIVE

## 2011-12-02 MED ORDER — INFLUENZA VIRUS VACC SPLIT PF IM SUSP
0.5000 mL | INTRAMUSCULAR | Status: AC
  Administered 2011-12-02: 0.5 mL via INTRAMUSCULAR

## 2011-12-02 MED ORDER — HPV QUADRIVALENT VACCINE IM SUSP
0.5000 mL | Freq: Once | INTRAMUSCULAR | Status: AC
  Administered 2011-12-02: 0.5 mL via INTRAMUSCULAR

## 2011-12-02 NOTE — Patient Instructions (Signed)
Human Papillomavirus (HPV) Gardasil Vaccine What You Need to Know WHAT IS HPV?  Genital human papillomavirus (HPV) is the most common sexually transmitted virus in the [place]. More than half of sexually active men and women are infected with HPV at some time in their lives.   About 20 million Americans are currently infected, and about 6 million more get infected each year. HPV is usually spread through sexual contact.   Most HPV infections do not cause any symptoms and go away on their own. But HPV can cause cervical cancer in women. Cervical cancer is the 2nd leading cause of cancer deaths among women around the world. In the [place], about 12,000 women get cervical cancer every year and about 4,000 are expected to die from it.   HPV is also associated with several less common cancers, such as vaginal and vulvar cancers in women, and anal and oropharyngeal (back of the throat, including base of tongue and tonsils) cancers in both men and women. HPV can also cause genital warts and warts in the throat.   There is no cure for HPV infection, but some of the problems it causes can be treated.  HPV VACCINE: WHY GET VACCINATED?  The HPV vaccine you are getting is 1 of 2 vaccines that can be given to prevent HPV. It may be given to both males and females.   This vaccine can prevent most cases of cervical cancer in females, if it is given before exposure to the virus. In addition, it can prevent vaginal and vulvar cancer in females, and genital warts and anal cancer in both males and females.   Protection from HPV vaccine is expected to be long-lasting. But vaccination is not a substitute for cervical cancer screening. Women should still get regular Pap tests.  WHO SHOULD GET THIS HPV VACCINE AND WHEN? HPV vaccine is given as a 3-dose series.  1st Dose: Now.   2nd Dose: 1 to 2 months after Dose 1.   3rd Dose: 6 months after Dose 1.  Additional (booster) doses are not  recommended Routine Vaccination This HPV vaccine is recommended for girls and boys 25 or 25 years of age. It may be given starting at age 25. Why is HPV vaccine recommended at 5 or 25 years of age?  HPV infection is easily acquired, even with only one sex partner. That is why it is important to get HPV vaccine before any sexual conact takes place. Also, response to the vaccine is better at this age than at older ages. Catch-Up Vaccination This vaccine is recommended for the following people who have not completed the 3-dose series:   Females 25 through 25 years of age.   Males 13 through 25 years of age.  This vaccine may be given to men 25 through 25 years of age who have not completed the 3-dose series. It is recommended for men through age 25 who have sex with men or whose immune system is weakened because of HIV infection, other illness, or medications.  HPV vaccine may be given at the same time as other vaccines. SOME PEOPLE SHOULD NOT GET HPV VACCINE OR SHOULD WAIT  Anyone who has ever had a life-threatening allergic reaction to any other component of HPV vaccine, or to a previous dose of HPV vaccine, should not get the vaccine. Tell your doctor if the person getting vaccinated has any severe allergies, including an allergy to yeast.   HPV vaccine is not recommended for pregnant women. However,  receiving HPV vaccine when pregnant is not a reason to consider terminating the pregnancy. Women who are breastfeeding may get the vaccine.   Any woman who learns she was pregnant when she got this HPV vaccine is encouraged to contact the manufacturer's HPV-in-pregnancy registry at [phone]. This will help [place] learn more about how pregnant women respond to the vaccine.   People who are mildly ill when a dose of HPV is planned can still be vaccinated. People with a moderate or severe illness should wait until they are better.  WHAT ARE THE RISKS FROM THIS VACCINE?  This HPV vaccine has been  used in the [place] and around the world for about 6 years and has been very safe.   However, any medicine could possibly cause a serious problem, such as a severe allergic reaction. The risk of any vaccine causing a serious injury, or death, is extremely small.   Life-threatening allergic reactions from vaccines are very rare. If they do occur, it would be within a few minutes to a few hours after the vaccination.  Several mild to moderate problems are known to occur with HPV vaccine. These do not last long and go away on their own.  Reactions in the arm where the shot was given:   Pain (about 8 people in 10).   Redness or swelling (about 1 person in 4).   Fever:   Mild (100 F or 37.8 C) (about 1 person in 10).   Moderate (102 F or 38.9 C) (about 1 person in 59).   Other problems:   Headache (about 1 person in 3).   Fainting: Brief fainting spells and related symptoms (such as jerking movements) can happen after any medical procedure, including vaccination. Sitting or lying down for about 15 minutes after a vaccination can help prevent fainting and injuries caused by falls. Tell your doctor if the patient feels dizzy or lightheaded, or has vision changes or ringing in the ears.   Like all vaccines, HPV vaccine will continue to be monitored for unusual or severe problems.  WHAT IF THERE IS A MODERATE OR SEVERE REACTION? What should I look for?  Any unusual condition, such as a high fever or unusual behavior. Signs of a serious allergic reaction can include difficulty breathing, hoarseness or wheezing, hives, paleness, weakness, a fast heartbeat, or dizziness.  What should I do?  Call a doctor, or get the person to a doctor right away.   Tell your doctor what happened, the date and time it happened, and when the vaccination was given.   Ask your doctor, nurse, or health department to report the reaction by filing a Vaccine Adverse Event Reporting System (VAERS) form. Or, you can  file this report through the VAERS website at www.vaers.LAgents.no or by calling 1-628 412 6170.  VAERS does not provide medical advice. THE NATIONAL VACCINE INJURY COMPENSATION PROGRAM  The National Vaccine Injury Compensation Program (VICP) was created in 1986.   Persons who believe they may have been injured by a vaccine can learn about the program and about filing a claim by calling 1-8781425381 or visiting the VICP website at SpiritualWord.at  HOW CAN I LEARN MORE?  Ask your doctor. They can give you the vaccine package insert or suggest other sources of information.   Call your local or state health department.   Contact the Centers for Disease Control and Prevention (CDC):   Call 705-199-0064 (1-800-CDC-INFO)  or   Visit CDC's website at PicCapture.uy  CDC Human Papillomavirus (HPV) Gardasil (  Interim) 11/25/10 Document Released: 07/18/2006 Document Revised: 06/08/2011 Document Reviewed: 01/25/2009 [facility] Patient Information 2012 [place], [place].Influenza Facts Flu (influenza) is a contagious respiratory illness caused by the influenza viruses. It can cause mild to severe illness. While most healthy people recover from the flu without specific treatment and without complications, older people, young children, and people with certain health conditions are at higher risk for serious complications from the flu, including death. CAUSES   The flu virus is spread from person to person by respiratory droplets from coughing and sneezing.   A person can also become infected by touching an object or surface with a virus on it and then touching their mouth, eye or nose.   Adults may be able to infect others from 1 day before symptoms occur and up to 7 days after getting sick. So it is possible to give someone the flu even before you know you are sick and continue to infect others while you are sick.  SYMPTOMS   Fever (usually high).   Headache.   Tiredness (can  be extreme).   Cough.   Sore throat.   Runny or stuffy nose.   Body aches.   Diarrhea and vomiting may also occur, particularly in children.   These symptoms are referred to as "flu-like symptoms". A lot of different illnesses, including the common cold, can have similar symptoms.  DIAGNOSIS   There are tests that can determine if you have the flu as long you are tested within the first 2 or 3 days of illness.   A doctor's exam and additional tests may be needed to identify if you have a disease that is a complicating the flu.  RISKS AND COMPLICATIONS  Some of the complications caused by the flu include:  Bacterial pneumonia or progressive pneumonia caused by the flu virus.   Loss of body fluids (dehydration).   Worsening of chronic medical conditions, such as heart failure, asthma, or diabetes.   Sinus problems and ear infections.  HOME CARE INSTRUCTIONS   Seek medical care early on.   If you are at high risk from complications of the flu, consult your health-care provider as soon as you develop flu-like symptoms. Those at high risk for complications include:   People 65 years or older.   People with chronic medical conditions, including diabetes.   Pregnant women.   Young children.   Your caregiver may recommend use of an antiviral medication to help treat the flu.   If you get the flu, get plenty of rest, drink a lot of liquids, and avoid using alcohol and tobacco.   You can take over-the-counter medications to relieve the symptoms of the flu if your caregiver approves. (Never give aspirin to children or teenagers who have flu-like symptoms, particularly fever).  PREVENTION  The single best way to prevent the flu is to get a flu vaccine each fall. Other measures that can help protect against the flu are:  Antiviral Medications   A number of antiviral drugs are approved for use in preventing the flu. These are prescription medications, and a doctor should be  consulted before they are used.   Habits for Good Health   Cover your nose and mouth with a tissue when you cough or sneeze, throw the tissue away after you use it.   Wash your hands often with soap and water, especially after you cough or sneeze. If you are not near water, use an alcohol-based hand cleaner.   Avoid people who are  sick.   If you get the flu, stay home from work or school. Avoid contact with other people so that you do not make them sick, too.   Try not to touch your eyes, nose, or mouth as germs ore often spread this way.  IN CHILDREN, EMERGENCY WARNING SIGNS THAT NEED URGENT MEDICAL ATTENTION:  Fast breathing or trouble breathing.   Bluish skin color.   Not drinking enough fluids.   Not waking up or not interacting.   Being so irritable that the child does not want to be held.   Flu-like symptoms improve but then return with fever and worse cough.   Fever with a rash.  IN ADULTS, EMERGENCY WARNING SIGNS THAT NEED URGENT MEDICAL ATTENTION:  Difficulty breathing or shortness of breath.   Pain or pressure in the chest or abdomen.   Sudden dizziness.   Confusion.   Severe or persistent vomiting.  SEEK IMMEDIATE MEDICAL CARE IF:  You or someone you know is experiencing any of the symptoms above. When you arrive at the emergency center,report that you think you have the flu. You may be asked to wear a mask and/or sit in a secluded area to protect others from getting sick. MAKE SURE YOU:   Understand these instructions.   Monitor your condition.   Seek medical care if you are getting worse, or not improving.  Document Released: 09/23/2003 Document Revised: 06/02/2011 Document Reviewed: 06/19/2009 [facility] Patient Information 2012 [place], [place].Safer Sex Your caregiver wants you to have this information about the infections that can be transmitted from sexual contact and how to prevent them. The idea behind safer sex is that you can be sexually active,  and at the same time reduce the risk of giving or getting a sexually transmitted disease (STD). Every person should be aware of how to prevent him or herself and his or her sex partner from getting an STD. CAUSES OF STDS STDs are transmitted by sharing body fluids, which contain viruses and bacteria. The following fluids all transmit infections during sexual intercourse and sex acts:  Semen.   Saliva.   Urine.   Blood.   Vaginal mucus.  Examples of STDs include:  Chlamydia.   Gonorrhea.   Genital herpes.   Hepatitis B.   Human immunodeficiency virus or acquired immunodeficiency syndrome (HIV or AIDS).   Syphilis.   Trichomonas.   Pubic lice.   Human papillomavirus (HPV), which may include:   Genital warts.   Cervical dysplasia.   Cervical cancer (can develop with certain types of HPV).  SYMPTOMS  Sexual diseases often cause few or no symptoms until they are advanced, so a person can be infected and spread the infection without knowing it. Some STDs respond to treatment very well. Others, like HIV and herpes, cannot be cured, but are treated to reduce their effects. Specific symptoms include:  Abnormal vaginal discharge.   Irritation or itching in and around the vagina, and in the pubic hair.   Pain during sexual intercourse.   Bleeding during sexual intercourse.   Pelvic or abdominal pain.   Fever.   Growths in and around the vagina.   An ulcer in or around the vagina.   Swollen glands in the groin area.  DIAGNOSIS   Blood tests.   Pap test.   Culture test of abnormal vaginal discharge.   A test that applies a solution and examines the cervix with a lighted magnifying scope (colposcopy).   A test that examines the pelvis with a  lighted tube, through a small incision (laparoscopy).  TREATMENT  The treatment will depend on the cause of the STD.  Antibiotic treatment by injection, oral, creams, or suppositories in the vagina.   Over-the-counter  medicated shampoo, to get rid of pubic lice.   Removing or treating growths with medicine, freezing, burning (electrocautery), or surgery.   Surgery treatment for HPV of the cervix.   Supportive medicines for herpes, HIV, AIDS, and hepatitis.  Being careful cannot eliminate all risk of infection, but sex can be made much safer. Safe sexual practices include body massage and gentle touching. Masturbation is safe, as long as body fluids do not contact skin that has sores or cuts. Dry kissing and oral sex on a man wearing a latex condom or on a woman wearing a female condom is also safe. Slightly less safe is intercourse while the man wears a latex condom or wet kissing. It is also safer to have one sex partner that you know is not having sex with anyone else. LENGTH OF ILLNESS An STD might be treated and cured in a week, sometimes a month, or more. And it can linger with symptoms for many years. STDs can also cause damage to the female organs. This can cause chronic pain, infertility, and recurrence of the STD, especially herpes, hepatitis, HIV, and HPV. HOME CARE INSTRUCTIONS AND PREVENTION  Alcohol and recreational drugs are often the reason given for not practicing safer sex. These substances affect your judgment. Alcohol and recreational drugs can also impair your immune system, making you more vulnerable to disease.   Do not engage in risky and dangerous sexual practices, including:   Vaginal or anal sex without a condom.   Oral sex on a man without a condom.   Oral sex on a woman without a female condom.   Using saliva to lubricate a condom.   Any other sexual contact in which body fluids or blood from one partner contact the other partner.   You should use only latex condoms for men and water soluble lubricants. Petroleum based lubricants or oils used to lubricate a condom will weaken the condom and increase the chance that it will break.   Think very carefully before having sex  with anyone who is high risk for STDs and HIV. This includes IV drug users, people with multiple sexual partners, or people who have had an STD, or a positive hepatitis or HIV blood test.   Remember that even if your partner has had only one previous partner, their previous partner might have had multiple partners. If so, you are at high risk of being exposed to an STD. You and your sex partner should be the only sex partners with each other, with no one else involved.   A vaccine is available for hepatitis B and HPV through your caregiver or the Public Health Department. Everyone should be vaccinated with these vaccines.   Avoid risky sex practices. Sex acts that can break the skin make you more likely to get an STD.  SEEK MEDICAL CARE IF:   If you think you have an STD, even if you do not have any symptoms. Contact your caregiver for evaluation and treatment, if needed.   You think or know your sex partner has acquired an STD.   You have any of the symptoms mentioned above.  Document Released: 10/28/2004 Document Revised: 06/02/2011 Document Reviewed: 08/20/2009 [facility] Patient Information 2012 [place], [place].

## 2011-12-02 NOTE — Progress Notes (Signed)
Patient here for test of cure for positive chlamydia in January and states she thinks she is due 2nd Gardisil injection. Interested in getting flu shot today

## 2011-12-02 NOTE — Progress Notes (Signed)
Chief Complaint:  Test of cure and Injections   Samantha [name] is  26 y.o. G0P0.  Patient's last menstrual period was 09/14/2011.Marland Kitchen  Her pregnancy status is negative.  She presents complaining of Test of cure and Injections  History of + chlamydia on pap in January. Reports she and partner completed treatment. RTC today for test of cure and to continue Guardisil vaccine and to receive Flu shot.  No complaints  Obstetrical/Gynecological History: OB History    Grav Para Term Preterm Abortions TAB SAB Ect Mult Living   0               Past Medical History: Past Medical History  Diagnosis Date  . PCOS (polycystic ovarian syndrome)     Past Surgical History: Past Surgical History  Procedure Date  . Tibia fracture surgery     Family History: Family History  Problem Relation Age of Onset  . Hypertension Other   . Diabetes Other   . Ulcers Mother   . GER disease Mother   . GER disease Sister   . Asthma Brother     Social History: History  Substance Use Topics  . Smoking status: Current Some Day Smoker -- 0.2 packs/day for 2 years    Types: Cigarettes  . Smokeless tobacco: Never Used  . Alcohol Use: Yes     socially    Allergies: No Known Allergies  Review of Systems - Negative except what has been reviewed in HPI  Physical Exam   Blood pressure 136/92, pulse 92, temperature 98.4 F (36.9 C), temperature source Oral, height 5' 4.5" (1.638 m), weight 252 lb 9.6 oz (114.579 kg), last menstrual period 09/14/2011.  General: General appearance - alert, well appearing, and in no distress, oriented to person, place, and time and obese Focused Gynecological Exam: VULVA: normal appearing vulva with no masses, tenderness or lesions, VAGINA: normal appearing vagina with normal color and discharge, no lesions, CERVIX: scant pink discharge noted at os, no CMT, UTERUS: uterus is normal size, shape, consistency and nontender, ADNEXA: normal adnexa in size, nontender and no  masses    Assessment/Plan:  1. Routine health maintenance  influenza  inactive virus vaccine (FLUZONE/FLUARIX) injection 0.5 mL, HPV vaccine quadrivalent 3 dose IM, hpv vaccine (GARDASIL) injection 0.5 mL  2. Chlamydia contact, treated  GC/chlamydia probe amp, genital      Barb Shear E. 12/02/2011,4:03 PM

## 2011-12-03 LAB — GC/CHLAMYDIA PROBE AMP, GENITAL: GC Probe Amp, Genital: NEGATIVE

## 2011-12-10 ENCOUNTER — Ambulatory Visit: Payer: No Typology Code available for payment source

## 2011-12-10 ENCOUNTER — Ambulatory Visit (INDEPENDENT_AMBULATORY_CARE_PROVIDER_SITE_OTHER): Payer: Self-pay | Admitting: *Deleted

## 2011-12-10 DIAGNOSIS — Z0000 Encounter for general adult medical examination without abnormal findings: Secondary | ICD-10-CM

## 2011-12-10 LAB — LIPID PANEL
Cholesterol: 207 mg/dL — ABNORMAL HIGH (ref 0–200)
HDL: 29 mg/dL — ABNORMAL LOW (ref >39)
Triglycerides: 118 mg/dL (ref <150)

## 2012-02-18 ENCOUNTER — Emergency Department (INDEPENDENT_AMBULATORY_CARE_PROVIDER_SITE_OTHER): Payer: BC Managed Care – PPO

## 2012-02-18 ENCOUNTER — Emergency Department (INDEPENDENT_AMBULATORY_CARE_PROVIDER_SITE_OTHER)
Admission: EM | Admit: 2012-02-18 | Discharge: 2012-02-18 | Disposition: A | Discharge status: Home / Self Care | Payer: BC Managed Care – PPO | Source: Home / Self Care | Attending: Emergency Medicine | Admitting: Emergency Medicine

## 2012-02-18 ENCOUNTER — Encounter (HOSPITAL_COMMUNITY): Payer: Self-pay | Admitting: *Deleted

## 2012-02-18 DIAGNOSIS — S76319A Strain of muscle, fascia and tendon of the posterior muscle group at thigh level, unspecified thigh, initial encounter: Secondary | ICD-10-CM

## 2012-02-18 DIAGNOSIS — M19072 Primary osteoarthritis, left ankle and foot: Secondary | ICD-10-CM

## 2012-02-18 DIAGNOSIS — M19079 Primary osteoarthritis, unspecified ankle and foot: Secondary | ICD-10-CM

## 2012-02-18 DIAGNOSIS — IMO0002 Reserved for concepts with insufficient information to code with codable children: Secondary | ICD-10-CM

## 2012-02-18 MED ORDER — MELOXICAM 15 MG PO TABS: 15.0000 mg | ORAL_TABLET | Freq: Every day | ORAL | Status: DC

## 2012-02-18 MED ORDER — PREDNISONE 5 MG PO KIT: 1.0000 | PACK | Freq: Every day | ORAL | Status: DC
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
## 2012-02-18 NOTE — Discharge Instructions (Signed)
Do stretches--10 repetitions twice daily followed by heat or ice.

## 2012-02-18 NOTE — ED Provider Notes (Signed)
Chief Complaint  Patient presents with  . Leg Pain    History of Present Illness:   The patient is a 26 year old female one-week history of right hamstring pain and left ankle pain. She fractured her left ankle in 2009 and had open reduction and internal fixation done by Dr. Venita Lick. It hasn't bothered her much up until the past week. She denies any injury to the ankle. There's been no swelling, no numbness, tingling, or weakness. She describes pain over both medial and lateral malleolus which is worse when she ambulates or bears weight and better if she rests. The pain in the hamstring occurred at the same time and she thinks this might have been due to the fact that she was favoring her right leg because of the pain the left leg. There is no swelling of the right leg. She does have some pain on straight leg raise but no numbness, tingling, or weakness.  Review of Systems:  Other than noted above, the patient denies any of the following symptoms: Systemic:  No fevers, chills, sweats, or aches.  No fatigue or tiredness. Musculoskeletal:  No joint pain, arthritis, bursitis, swelling, back pain, or neck pain. Neurological:  No muscular weakness, paresthesias, headache, or trouble with speech or coordination.  No dizziness.   PMFSH:  Past medical history, family history, social history, meds, and allergies were reviewed.  Physical Exam:   Vital signs:  BP 137/88  Pulse 102  Temp(Src) 98.4 F (36.9 C) (Oral)  Resp 18  SpO2 100% Gen:  Alert and oriented times 3.  In no distress. Musculoskeletal: There is pain to palpation in the left ankle over both medial and lateral malleolar. There is no swelling. She has limited range of motion of the ankle with pain. There's no pain to palpation over the tibia or over the foot. Pulses are full. There is no edema. Muscle strength and sensation are normal. Exam of the right leg reveals no swelling. No calf tenderness, Homans sign was negative. Pedal pulse  was full. There is no pain to palpation of the ankle the knee or the hip. All joints have a full range of motion, but she does have some pain referred to the hamstring area on straight leg raise. Otherwise, all joints had a full a ROM with no swelling, bruising or deformity.  No edema, pulses full. Extremities were warm and pink.  Capillary refill was brisk.  Skin:  Clear, warm and dry.  No rash. Neuro:  Alert and oriented times 3.  Muscle strength was normal.  Sensation was intact to light touch.   Radiology:  Dg Ankle Complete Left  02/18/2012  *RADIOLOGY REPORT*  Clinical Data: Left ankle pain, fracture 2009 with ORIF  LEFT ANKLE COMPLETE - 3+ VIEW  Comparison: 03/30/2009  Findings: Side plate and screws fixing previously seen distal fibular fracture noted as well as medial malleolar screw.  No evidence for hardware failure.  Fracture line no longer visible. No acute finding.  The mortise is symmetric.  No soft tissue abnormality.  Minimal plantar calcaneal spurring again noted.  IMPRESSION: No acute finding or evidence for hardware failure.  Original Report Authenticated By: Harrel Lemon, M.D.    Assessment:  The primary encounter diagnosis was Hamstring strain. 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  Plan:   1.  The following meds were prescribed:   New Prescriptions   MELOXICAM (MOBIC) 15 MG TABLET    Take 1 tablet (  15 mg total) by mouth daily.   PREDNISONE 5 MG KIT    Take 1 kit (5 mg total) by mouth daily after breakfast. Prednisone 5 mg 6 day dosepack.  Take as directed.   2.  The patient was instructed in symptomatic care, including rest and activity, elevation, application of ice and compression.  Appropriate handouts were given. 3.  The patient was told to return if becoming worse in any way, if no better in 3 or 4 days, and given some red flag symptoms that would indicate earlier return.   4.  The patient was told to follow up with Dr.  Shon Baton. She was given some stretching exercises in the meantime an ASO brace was applied to the left ankle.   Reuben Likes, MD 02/18/12 [phone]

## 2012-02-25 ENCOUNTER — Ambulatory Visit (INDEPENDENT_AMBULATORY_CARE_PROVIDER_SITE_OTHER): Payer: BC Managed Care – PPO

## 2012-03-29 ENCOUNTER — Ambulatory Visit: Payer: BC Managed Care – PPO

## 2012-03-29 ENCOUNTER — Ambulatory Visit (INDEPENDENT_AMBULATORY_CARE_PROVIDER_SITE_OTHER): Payer: BC Managed Care – PPO | Admitting: General Practice

## 2012-03-29 VITALS — BP 126/89 | HR 90 | Temp 98.8°F | Ht 65.0 in | Wt 256.9 lb

## 2012-03-29 DIAGNOSIS — Z23 Encounter for immunization: Secondary | ICD-10-CM

## 2012-10-18 ENCOUNTER — Other Ambulatory Visit: Payer: Self-pay

## 2013-01-26 ENCOUNTER — Encounter (HOSPITAL_COMMUNITY): Payer: Self-pay

## 2013-01-26 ENCOUNTER — Inpatient Hospital Stay (HOSPITAL_COMMUNITY)
Admission: AD | Admit: 2013-01-26 | Discharge: 2013-01-26 | Disposition: A | Discharge status: Home / Self Care | Payer: Commercial Managed Care - PPO | Source: Ambulatory Visit | Attending: Obstetrics and Gynecology | Admitting: Obstetrics and Gynecology

## 2013-01-26 DIAGNOSIS — R109 Unspecified abdominal pain: Secondary | ICD-10-CM

## 2013-01-26 DIAGNOSIS — O9989 Other specified diseases and conditions complicating pregnancy, childbirth and the puerperium: Secondary | ICD-10-CM

## 2013-01-26 DIAGNOSIS — R1011 Right upper quadrant pain: Secondary | ICD-10-CM | POA: Insufficient documentation

## 2013-01-26 DIAGNOSIS — O99891 Other specified diseases and conditions complicating pregnancy: Secondary | ICD-10-CM | POA: Insufficient documentation

## 2013-01-26 LAB — COMPREHENSIVE METABOLIC PANEL
ALT: 22 U/L (ref 0–35)
Alkaline Phosphatase: 78 U/L (ref 39–117)
CO2: 22 mEq/L (ref 19–32)
Calcium: 9.4 mg/dL (ref 8.4–10.5)
GFR calc Af Amer: >90 mL/min (ref >90)
GFR calc non Af Amer: >90 mL/min (ref >90)
Glucose, Bld: 79 mg/dL (ref 70–99)
Sodium: 136 mEq/L (ref 135–145)
Total Bilirubin: 0.3 mg/dL (ref 0.3–1.2)

## 2013-01-26 LAB — URINALYSIS, ROUTINE W REFLEX MICROSCOPIC
Bilirubin Urine: NEGATIVE
Hgb urine dipstick: NEGATIVE
Nitrite: NEGATIVE
Specific Gravity, Urine: 1.02 (ref 1.005–1.030)
pH: 6 (ref 5.0–8.0)

## 2013-01-26 LAB — CBC
Hemoglobin: 11.1 g/dL — ABNORMAL LOW (ref 12.0–15.0)
MCH: 25.6 pg — ABNORMAL LOW (ref 26.0–34.0)
RBC: 4.34 MIL/uL (ref 3.87–5.11)

## 2013-01-26 NOTE — MAU Provider Note (Signed)
History     CSN: [id number]  Arrival date and time: 01/26/13 1444   First Provider Initiated Contact with Patient 01/26/13 1515      Chief Complaint  Patient presents with  . Abdominal Pain   HPI 27 y.o. G1P0 at [redacted]w[redacted]d with sudden onset of RUQ pain about 30 minutes prior to arrival in MAU. Lasted less than 30 minutes. No pain at all now. No fever, bleeding, contractions, nausea/vomiting, constipation or diarrhea.    Past Medical History  Diagnosis Date  . PCOS (polycystic ovarian syndrome)   . Chlamydia 12/02/2011  . Diabetes mellitus     borderline    Past Surgical History  Procedure Laterality Date  . Tibia fracture surgery      Family History  Problem Relation Age of Onset  . Hypertension Other   . Diabetes Other   . Ulcers Mother   . GER disease Mother   . GER disease Sister   . Asthma Brother     History  Substance Use Topics  . Smoking status: Current Some Day Smoker -- 0.50 packs/day for 2 years    Types: Cigarettes  . Smokeless tobacco: Never Used  . Alcohol Use: Yes     Comment: socially none since pregnancy    Allergies: No Known Allergies  No prescriptions prior to admission    Review of Systems  Constitutional: Negative.  Negative for fever, chills and malaise/fatigue.  Respiratory: Negative.   Cardiovascular: Negative.   Gastrointestinal: Positive for abdominal pain. Negative for nausea, vomiting, diarrhea and constipation.  Genitourinary: Negative for dysuria, urgency, frequency, hematuria and flank pain.       Negative for vaginal bleeding, cramping/contractions  Musculoskeletal: Negative.   Neurological: Negative.   Psychiatric/Behavioral: Negative.    Physical Exam   Blood pressure 132/80, pulse 92, temperature 98 F (36.7 C), temperature source Oral, resp. rate 18.  Physical Exam  Nursing note and vitals reviewed. Constitutional: She is oriented to person, place, and time. She appears well-developed and well-nourished. No  distress.  Cardiovascular: Normal rate.   Respiratory: Effort normal.  GI: Soft. She exhibits no distension and no mass. There is no tenderness. There is no rebound and no guarding.  Musculoskeletal: Normal range of motion.  Neurological: She is alert and oriented to person, place, and time.  Skin: Skin is warm and dry.  Psychiatric: She has a normal mood and affect.   TOCO quiet, reassuring FHR at 23 weeks  MAU Course  Procedures Results for orders placed during the hospital encounter of 01/26/13 (from the past 24 hour(s))  URINALYSIS, ROUTINE W REFLEX MICROSCOPIC     Status: Abnormal   Collection Time    01/26/13  3:00 PM      Result Value Range   Color, Urine YELLOW  YELLOW   APPearance CLEAR  CLEAR   Specific Gravity, Urine 1.020  1.005 - 1.030   pH 6.0  5.0 - 8.0   Glucose, UA NEGATIVE  NEGATIVE mg/dL   Hgb urine dipstick NEGATIVE  NEGATIVE   Bilirubin Urine NEGATIVE  NEGATIVE   Ketones, ur 15 (*) NEGATIVE mg/dL   Protein, ur NEGATIVE  NEGATIVE mg/dL   Urobilinogen, UA 0.2  0.0 - 1.0 mg/dL   Nitrite NEGATIVE  NEGATIVE   Leukocytes, UA NEGATIVE  NEGATIVE  CBC     Status: Abnormal   Collection Time    01/26/13  3:16 PM      Result Value Range   WBC 6.8  4.0 - 10.5 K/uL  RBC 4.34  3.87 - 5.11 MIL/uL   Hemoglobin 11.1 (*) 12.0 - 15.0 g/dL   HCT 16.1 (*) 09.6 - 04.5 %   MCV 77.6 (*) 78.0 - 100.0 fL   MCH 25.6 (*) 26.0 - 34.0 pg   MCHC 32.9  30.0 - 36.0 g/dL   RDW 40.9 (*) 81.1 - 91.4 %   Platelets 381  150 - 400 K/uL  COMPREHENSIVE METABOLIC PANEL     Status: Abnormal   Collection Time    01/26/13  3:46 PM      Result Value Range   Sodium 136  135 - 145 mEq/L   Potassium 3.6  3.5 - 5.1 mEq/L   Chloride 102  96 - 112 mEq/L   CO2 22  19 - 32 mEq/L   Glucose, Bld 79  70 - 99 mg/dL   BUN 4 (*) 6 - 23 mg/dL   Creatinine, Ser 7.82  0.50 - 1.10 mg/dL   Calcium 9.4  8.4 - 95.6 mg/dL   Total Protein 6.6  6.0 - 8.3 g/dL   Albumin 2.5 (*) 3.5 - 5.2 g/dL   AST 22  0 - 37  U/L   ALT 22  0 - 35 U/L   Alkaline Phosphatase 78  39 - 117 U/L   Total Bilirubin 0.3  0.3 - 1.2 mg/dL   GFR calc non Af Amer >90  >90 mL/min   GFR calc Af Amer >90  >90 mL/min     Assessment and Plan  27 y.o. G1P0 at [redacted]w[redacted]d with single episode of RUQ pain which has now resolved, no recurrence of pain while in MAU CBC and CMP normal, no other symptoms Call or return with recurrence of pain, otherwise follow up as scheduled, precautions rev'd  Shaylee Stanislawski 01/26/2013, 6:57 PM

## 2013-01-26 NOTE — MAU Note (Signed)
Patient given apple jiuce.

## 2013-01-26 NOTE — MAU Note (Signed)
Onset of sharp on right side of upper abdomen about 30 minutes ago, denies bleeding, no constipation, no dysuria.

## 2013-03-02 LAB — OB RESULTS CONSOLE HIV ANTIBODY (ROUTINE TESTING): HIV: NONREACTIVE

## 2013-03-02 LAB — OB RESULTS CONSOLE RPR: RPR: NONREACTIVE

## 2013-03-02 LAB — OB RESULTS CONSOLE GC/CHLAMYDIA: Gonorrhea: NEGATIVE

## 2013-03-02 LAB — OB RESULTS CONSOLE ABO/RH: RH Type: POSITIVE

## 2013-03-12 ENCOUNTER — Inpatient Hospital Stay (HOSPITAL_COMMUNITY)
Admission: AD | Admit: 2013-03-12 | Discharge: 2013-03-12 | Disposition: A | Discharge status: Home / Self Care | Payer: Commercial Managed Care - PPO | Source: Ambulatory Visit | Attending: Obstetrics and Gynecology | Admitting: Obstetrics and Gynecology

## 2013-03-12 ENCOUNTER — Encounter (HOSPITAL_COMMUNITY): Payer: Self-pay | Admitting: *Deleted

## 2013-03-12 DIAGNOSIS — R42 Dizziness and giddiness: Secondary | ICD-10-CM | POA: Insufficient documentation

## 2013-03-12 HISTORY — DX: Unspecified infectious disease: B99.9

## 2013-03-12 HISTORY — DX: Unspecified fracture of left lower leg, initial encounter for closed fracture: S82.92XA

## 2013-03-12 HISTORY — DX: Calculus of kidney: N20.0

## 2013-03-12 LAB — COMPREHENSIVE METABOLIC PANEL
CO2: 22 mEq/L (ref 19–32)
Calcium: 9.7 mg/dL (ref 8.4–10.5)
Creatinine, Ser: 0.59 mg/dL (ref 0.50–1.10)
GFR calc Af Amer: >90 mL/min (ref >90)
GFR calc non Af Amer: >90 mL/min (ref >90)
Glucose, Bld: 75 mg/dL (ref 70–99)
Total Protein: 7.2 g/dL (ref 6.0–8.3)

## 2013-03-12 LAB — CBC
Hemoglobin: 11.2 g/dL — ABNORMAL LOW (ref 12.0–15.0)
MCH: 25.9 pg — ABNORMAL LOW (ref 26.0–34.0)
MCHC: 32.7 g/dL (ref 30.0–36.0)
MCV: 79.4 fL (ref 78.0–100.0)
RBC: 4.32 MIL/uL (ref 3.87–5.11)

## 2013-03-12 LAB — URINE MICROSCOPIC-ADD ON

## 2013-03-12 LAB — URINALYSIS, ROUTINE W REFLEX MICROSCOPIC
Bilirubin Urine: NEGATIVE
Ketones, ur: >80 mg/dL — AB
Nitrite: NEGATIVE
Protein, ur: NEGATIVE mg/dL
Specific Gravity, Urine: 1.025 (ref 1.005–1.030)
Urobilinogen, UA: 0.2 mg/dL (ref 0.0–1.0)

## 2013-03-12 MED ORDER — DEXTROSE 5 % IN LACTATED RINGERS IV BOLUS
1000.0000 mL | Freq: Once | INTRAVENOUS | Status: AC
  Administered 2013-03-12: 1000 mL via INTRAVENOUS

## 2013-03-12 NOTE — MAU Provider Note (Signed)
History     CSN: [id number]  Arrival date and time: 03/12/13 1628   None     Chief Complaint  Patient presents with  . Dizziness   HPI 27 y.o. G1P0 at [redacted]w[redacted]d with lightheadedness intermittently since last night. Felt like she was going to pass out, but hasn't actually lost consciousness. + fetal movement, no contractions, LOF, bleeding. States she doesn't drink much water, only eats about twice daily.   Past Medical History  Diagnosis Date  . PCOS (polycystic ovarian syndrome)   . Chlamydia 12/02/2011  . Diabetes mellitus     borderline  . Headache(784.0)   . Infection     UTI  . Kidney stone   . Fracture of left leg     Past Surgical History  Procedure Laterality Date  . Tibia fracture surgery      Family History  Problem Relation Age of Onset  . Hypertension Other   . Diabetes Other   . Ulcers Mother   . GER disease Mother   . GER disease Sister   . Asthma Brother     History  Substance Use Topics  . Smoking status: Former Smoker -- 0.50 packs/day for 2 years    Types: Cigarettes  . Smokeless tobacco: Never Used     Comment: quit with preg  . Alcohol Use: No     Comment: socially none since pregnancy    Allergies: No Known Allergies  Prescriptions prior to admission  Medication Sig Dispense Refill  . acetaminophen (TYLENOL) 500 MG tablet Take 1,000 mg by mouth every 6 (six) hours as needed for pain (For headaches.).      Marland Kitchen Prenatal Vit-Fe Fumarate-FA (PRENATAL MULTIVITAMIN) TABS Take 1 tablet by mouth at bedtime.        Review of Systems  Constitutional: Positive for malaise/fatigue. Negative for fever and chills.  Eyes: Negative for blurred vision.  Respiratory: Negative.  Negative for shortness of breath.   Cardiovascular: Negative.  Negative for chest pain.  Gastrointestinal: Negative for nausea, vomiting, abdominal pain, diarrhea and constipation.  Genitourinary: Negative for dysuria, urgency, frequency, hematuria and flank pain.       Negative  for vaginal bleeding, cramping/contractions  Musculoskeletal: Negative.   Neurological: Positive for dizziness. Negative for sensory change, speech change, focal weakness, seizures, loss of consciousness and headaches.  Psychiatric/Behavioral: Negative.    Physical Exam   Blood pressure 119/72, pulse 95, temperature 98.3 F (36.8 C), temperature source Oral, resp. rate 16, SpO2 99.00%.  Physical Exam  Nursing note and vitals reviewed. Constitutional: She is oriented to person, place, and time. She appears well-developed and well-nourished. No distress.  Cardiovascular: Normal rate and regular rhythm.   Respiratory: Effort normal. No respiratory distress.  GI: Soft. There is no tenderness.  Musculoskeletal: Normal range of motion.  Neurological: She is alert and oriented to person, place, and time. No cranial nerve deficit.  Skin: Skin is warm.  Psychiatric: She has a normal mood and affect.    MAU Course  Procedures Results for orders placed during the hospital encounter of 03/12/13 (from the past 72 hour(s))  CBC     Status: Abnormal   Collection Time    03/12/13  4:33 PM      Result Value Range   WBC 7.7  4.0 - 10.5 K/uL   RBC 4.32  3.87 - 5.11 MIL/uL   Hemoglobin 11.2 (*) 12.0 - 15.0 g/dL   HCT 13.0 (*) 86.5 - 78.4 %   MCV 79.4  78.0 - 100.0 fL   MCH 25.9 (*) 26.0 - 34.0 pg   MCHC 32.7  30.0 - 36.0 g/dL   RDW 78.2 (*) 95.6 - 21.3 %   Platelets 335  150 - 400 K/uL  COMPREHENSIVE METABOLIC PANEL     Status: Abnormal   Collection Time    03/12/13  4:33 PM      Result Value Range   Sodium 136  135 - 145 mEq/L   Potassium 3.4 (*) 3.5 - 5.1 mEq/L   Chloride 103  96 - 112 mEq/L   CO2 22  19 - 32 mEq/L   Glucose, Bld 75  70 - 99 mg/dL   BUN 4 (*) 6 - 23 mg/dL   Creatinine, Ser 0.86  0.50 - 1.10 mg/dL   Calcium 9.7  8.4 - 57.8 mg/dL   Total Protein 7.2  6.0 - 8.3 g/dL   Albumin 2.7 (*) 3.5 - 5.2 g/dL   AST 23  0 - 37 U/L   ALT 19  0 - 35 U/L   Alkaline Phosphatase 103   39 - 117 U/L   Total Bilirubin 0.3  0.3 - 1.2 mg/dL   GFR calc non Af Amer >90  >90 mL/min   GFR calc Af Amer >90  >90 mL/min   Comment:            The eGFR has been calculated     using the CKD EPI equation.     This calculation has not been     validated in all clinical     situations.     eGFR's persistently     <90 mL/min signify     possible Chronic Kidney Disease.  URINALYSIS, ROUTINE W REFLEX MICROSCOPIC     Status: Abnormal   Collection Time    03/12/13  5:00 PM      Result Value Range   Color, Urine YELLOW  YELLOW   APPearance CLEAR  CLEAR   Specific Gravity, Urine 1.025  1.005 - 1.030   pH 6.0  5.0 - 8.0   Glucose, UA NEGATIVE  NEGATIVE mg/dL   Hgb urine dipstick TRACE (*) NEGATIVE   Bilirubin Urine NEGATIVE  NEGATIVE   Ketones, ur >80 (*) NEGATIVE mg/dL   Protein, ur NEGATIVE  NEGATIVE mg/dL   Urobilinogen, UA 0.2  0.0 - 1.0 mg/dL   Nitrite NEGATIVE  NEGATIVE   Leukocytes, UA SMALL (*) NEGATIVE  URINE MICROSCOPIC-ADD ON     Status: Abnormal   Collection Time    03/12/13  5:00 PM      Result Value Range   Squamous Epithelial / LPF FEW (*) RARE   WBC, UA 11-20  <3 WBC/hpf   RBC / HPF 3-6  <3 RBC/hpf   Bacteria, UA MANY (*) RARE   Urine-Other MUCOUS PRESENT     Normal orthostatic VS  IV hydration with 1 L bolus D5LR  Assessment and Plan  27 y.o. G1P0 at [redacted]w[redacted]d 1. Episodic lightheadedness   Likely related to not eating and drinking enough in pregnancy - recommended 8-10 8 oz glasses of water daily, eating small frequent meals and snacks Precautions rev'd F/U as scheduled    Medication List    TAKE these medications       acetaminophen 500 MG tablet  Commonly known as:  TYLENOL  Take 1,000 mg by mouth every 6 (six) hours as needed for pain (For headaches.).     prenatal multivitamin Tabs  Take 1 tablet by mouth  at bedtime.            Follow-up Information   Follow up with [facility] (as scheduled)     Contact information:   [address] [place] [address] [phone]        Georges Mouse 03/12/2013, 5:37 PM

## 2013-03-12 NOTE — MAU Note (Signed)
Patient states that she started having dizzy spells yesterday. Today it happened again and she felt like she was going to pass out, but did not. Denies pain, bleeding and leaking and reports good fetal movement.

## 2013-03-12 NOTE — MAU Note (Signed)
Patient is not in the lobby when called to triage.  

## 2013-03-12 NOTE — MAU Note (Signed)
Has been dizzy past few days, felt like she was going to pass out.  Admitted is probably not drinking enough.

## 2013-03-15 LAB — URINE CULTURE

## 2013-03-18 ENCOUNTER — Other Ambulatory Visit: Payer: Self-pay | Admitting: Advanced Practice Midwife

## 2013-03-18 DIAGNOSIS — N390 Urinary tract infection, site not specified: Secondary | ICD-10-CM | POA: Insufficient documentation

## 2013-03-18 MED ORDER — CEPHALEXIN 500 MG PO CAPS: 500.0000 mg | ORAL_CAPSULE | Freq: Four times a day (QID) | ORAL | Status: AC

## 2013-03-20 ENCOUNTER — Telehealth: Payer: Self-pay | Admitting: *Deleted

## 2013-03-20 NOTE — Telephone Encounter (Addendum)
Message copied by Jill Side on Tue Mar 20, 2013  2:27 PM ------      Message from: Aviva Signs      Created: Sun Mar 18, 2013  7:42 PM      Regarding: UTI tx       [facility]us UTI diagnosed 6/9.            I put in an order for Keflex            Can u notify her?            Thanks ------ I called pt and left message that I am calling with test results. Please call us back and indicate whether we can leave details of her test results on her voice mail.

## 2013-03-21 NOTE — Telephone Encounter (Signed)
Patient called and left message stating it is okay to leave results on her voicemail if she doesn't answer. Called patient back and informed her of results and she stated she already got treated for a UTI because she got the Rx from Dr Dareen Piano. Told patient that the results from her MAU must have went to one of our providers who saw her and wasn't aware that she had already been treated. Patient verbalized understanding and had no  Further questions

## 2013-05-03 ENCOUNTER — Encounter (HOSPITAL_COMMUNITY): Payer: Self-pay | Admitting: *Deleted

## 2013-05-03 ENCOUNTER — Other Ambulatory Visit: Payer: Self-pay

## 2013-05-03 ENCOUNTER — Inpatient Hospital Stay (HOSPITAL_COMMUNITY)
Admission: AD | Admit: 2013-05-03 | Discharge: 2013-05-03 | Disposition: A | Discharge status: Home / Self Care | Payer: Commercial Managed Care - PPO | Source: Ambulatory Visit | Attending: Obstetrics and Gynecology | Admitting: Obstetrics and Gynecology

## 2013-05-03 DIAGNOSIS — O479 False labor, unspecified: Secondary | ICD-10-CM | POA: Insufficient documentation

## 2013-05-03 DIAGNOSIS — O26893 Other specified pregnancy related conditions, third trimester: Secondary | ICD-10-CM

## 2013-05-03 DIAGNOSIS — R0602 Shortness of breath: Secondary | ICD-10-CM | POA: Insufficient documentation

## 2013-05-03 DIAGNOSIS — O99891 Other specified diseases and conditions complicating pregnancy: Secondary | ICD-10-CM | POA: Insufficient documentation

## 2013-05-03 MED ORDER — OMEPRAZOLE 20 MG PO CPDR: 20.0000 mg | DELAYED_RELEASE_CAPSULE | Freq: Every day | ORAL | Status: DC

## 2013-05-03 NOTE — MAU Note (Signed)
Patient states she has had shortness of breath when lying down for about one week. States it sometimes happens when being up. Denies pain,bleeding or leaking and reports good fetal movement. Respirations regular and even in triage.

## 2013-05-03 NOTE — MAU Note (Addendum)
Pt states she has been having shortness of breath x 1week. Pt states she feels it when she is laying down

## 2013-05-03 NOTE — MAU Provider Note (Signed)
Chief Complaint:  Shortness of Breath   First Provider Initiated Contact with Patient 05/03/13 1624      HPI: Samantha [name] is a 27 y.o. G1P0 at 62w4dwho presents to maternity admissions reporting SOB when lying down on her side. She describes the SOB as severe enough to cause her to have to get on her hands and knees and try to catch her breath and this happens several times/day.  She currently denies feeling SOB while sitting up in hospital bed.  She reports good fetal movement, denies LOF, vaginal bleeding, vaginal itching/burning, urinary symptoms, h/a, dizziness, n/v, or fever/chills.     Past Medical History: Past Medical History  Diagnosis Date  . PCOS (polycystic ovarian syndrome)   . Chlamydia 12/02/2011  . Diabetes mellitus     borderline  . Headache(784.0)   . Infection     UTI  . Kidney stone   . Fracture of left leg     Past obstetric history: OB History   Grav Para Term Preterm Abortions TAB SAB Ect Mult Living   1              # Outc Date GA Lbr Len/2nd Wgt Sex Del Anes PTL Lv   1 CUR               Past Surgical History: Past Surgical History  Procedure Laterality Date  . Tibia fracture surgery      Family History: Family History  Problem Relation Age of Onset  . Hypertension Other   . Diabetes Other   . Ulcers Mother   . GER disease Mother   . GER disease Sister   . Asthma Brother     Social History: History  Substance Use Topics  . Smoking status: Former Smoker -- 0.50 packs/day for 2 years    Types: Cigarettes  . Smokeless tobacco: Never Used     Comment: quit with preg  . Alcohol Use: No     Comment: socially none since pregnancy    Allergies: No Known Allergies  Meds:  Prescriptions prior to admission  Medication Sig Dispense Refill  . acetaminophen (TYLENOL) 500 MG tablet Take 1,000 mg by mouth every 6 (six) hours as needed for pain (For headaches.).      Marland Kitchen Prenatal Vit-Fe Fumarate-FA (PRENATAL MULTIVITAMIN) TABS Take 1 tablet  by mouth at bedtime.        ROS: Pertinent findings in history of present illness.  Physical Exam  Blood pressure 120/77, pulse 78, temperature 98.3 F (36.8 C), temperature source Oral, resp. rate 16, height 5\' 6"  (1.676 m), weight 110.768 kg (244 lb 3.2 oz), SpO2 100.00%. GENERAL: Well-developed, well-nourished female in no acute distress.  HEENT: normocephalic HEART: normal rate, rhythm, heart sounds RESP: normal effort, lung sounds clear and equal bilaterally in all lobes.   ABDOMEN: Soft, non-tender, gravid appropriate for gestational age EXTREMITIES: Nontender, trace edema generalized NEURO: alert and oriented     FHT:  Baseline 135 , moderate variability, accelerations present, no decelerations Contractions: rare   Assessment: SOB in pregnancy  Plan: Dr Tenny Craw to see pt in MAU D/C home   Sharen Counter Certified Nurse-Midwife 05/03/2013 4:37 PM

## 2013-05-05 ENCOUNTER — Inpatient Hospital Stay (HOSPITAL_COMMUNITY): Payer: Commercial Managed Care - PPO

## 2013-05-05 ENCOUNTER — Inpatient Hospital Stay (HOSPITAL_COMMUNITY)
Admission: AD | Admit: 2013-05-05 | Discharge: 2013-05-05 | Disposition: A | Discharge status: Home / Self Care | Payer: Commercial Managed Care - PPO | Source: Ambulatory Visit | Attending: Obstetrics and Gynecology | Admitting: Obstetrics and Gynecology

## 2013-05-05 ENCOUNTER — Encounter (HOSPITAL_COMMUNITY): Payer: Self-pay | Admitting: *Deleted

## 2013-05-05 DIAGNOSIS — O368190 Decreased fetal movements, unspecified trimester, not applicable or unspecified: Secondary | ICD-10-CM | POA: Insufficient documentation

## 2013-05-05 DIAGNOSIS — O479 False labor, unspecified: Secondary | ICD-10-CM | POA: Insufficient documentation

## 2013-05-05 NOTE — MAU Note (Signed)
Pt presents with complaints of a decrease in fetal movement. She states she has not felt the baby move since late last night. Denies any bleeding or LOF.

## 2013-05-05 NOTE — Progress Notes (Signed)
Dr Henderson Cloud notified of pt's BPP results of 6/8, NST, orders received to discharge home.

## 2013-05-05 NOTE — MAU Provider Note (Signed)
  History     CSN: [id number]  Arrival date and time: 05/05/13 4098   None     Chief Complaint  Patient presents with  . Decreased Fetal Movement   HPI This is a 27 y.o. female at [redacted]w[redacted]d who presents with report of decreased fetal movement today. Now feels movement. Denies leaking, bleeding or pain.   RN Note: Pt presents with complaints of a decrease in fetal movement. She states she has not felt the baby move since late last night. Denies any bleeding or LOF.      OB History   Grav Para Term Preterm Abortions TAB SAB Ect Mult Living   1               Past Medical History  Diagnosis Date  . PCOS (polycystic ovarian syndrome)   . Chlamydia 12/02/2011  . Diabetes mellitus     borderline  . Headache(784.0)   . Infection     UTI  . Kidney stone   . Fracture of left leg     Past Surgical History  Procedure Laterality Date  . Tibia fracture surgery      Family History  Problem Relation Age of Onset  . Hypertension Other   . Diabetes Other   . Ulcers Mother   . GER disease Mother   . GER disease Sister   . Asthma Brother     History  Substance Use Topics  . Smoking status: Former Smoker -- 0.50 packs/day for 2 years    Types: Cigarettes  . Smokeless tobacco: Never Used     Comment: quit with preg  . Alcohol Use: No     Comment: socially none since pregnancy    Allergies: No Known Allergies  Prescriptions prior to admission  Medication Sig Dispense Refill  . acetaminophen (TYLENOL) 500 MG tablet Take 1,000 mg by mouth every 6 (six) hours as needed for pain (For headaches.).      Marland Kitchen omeprazole (PRILOSEC) 20 MG capsule Take 1 capsule (20 mg total) by mouth daily.  30 capsule  1  . Prenatal Vit-Fe Fumarate-FA (PRENATAL MULTIVITAMIN) TABS Take 1 tablet by mouth at bedtime.        Review of Systems  Constitutional: Negative for fever and malaise/fatigue.  Gastrointestinal: Negative for nausea, vomiting, abdominal pain and diarrhea.  Neurological: Negative  for dizziness.   Physical Exam   Blood pressure 124/87, pulse 80, temperature 97.6 F (36.4 C), temperature source Oral, resp. rate 16, height 5\' 6"  (1.676 m), weight 111.131 kg (245 lb).  Physical Exam  Constitutional: She is oriented to person, place, and time. She appears well-developed and well-nourished. No distress.  HENT:  Head: Normocephalic.  Cardiovascular: Normal rate.   Respiratory: Effort normal.  GI: Soft. There is no tenderness.  Musculoskeletal: Normal range of motion.  Neurological: She is alert and oriented to person, place, and time.  Skin: Skin is warm and dry.  Psychiatric: She has a normal mood and affect.   NST reactive with accels and average variability Rare contractions.     MAU Course  Procedures  MDM Discussed with Dr Henderson Cloud. Wants to add BPP/AFI to NST.    Assessment and Plan  S:  SIUP at [redacted]w[redacted]d         O:  RN to call Dr Henderson Cloud for disposition  >> discharged home      Follow up in office  [facility] 05/05/2013, 9:06 AM

## 2013-05-10 ENCOUNTER — Encounter (HOSPITAL_COMMUNITY): Payer: Self-pay | Admitting: *Deleted

## 2013-05-10 ENCOUNTER — Inpatient Hospital Stay (HOSPITAL_COMMUNITY)
Admission: AD | Admit: 2013-05-10 | Discharge: 2013-05-15 | DRG: 766 | Disposition: A | Discharge status: Home / Self Care | Payer: Commercial Managed Care - PPO | Source: Ambulatory Visit | Attending: Obstetrics and Gynecology | Admitting: Obstetrics and Gynecology

## 2013-05-10 DIAGNOSIS — O99334 Smoking (tobacco) complicating childbirth: Secondary | ICD-10-CM | POA: Diagnosis present

## 2013-05-10 DIAGNOSIS — O34219 Maternal care for unspecified type scar from previous cesarean delivery: Secondary | ICD-10-CM

## 2013-05-10 LAB — CBC
Hemoglobin: 12.9 g/dL (ref 12.0–15.0)
MCV: 79.1 fL (ref 78.0–100.0)
RBC: 4.88 MIL/uL (ref 3.87–5.11)
WBC: 6.9 10*3/uL (ref 4.0–10.5)

## 2013-05-10 LAB — OB RESULTS CONSOLE GBS: GBS: NEGATIVE

## 2013-05-10 LAB — ABO/RH: ABO/RH(D): B POS

## 2013-05-10 LAB — TYPE AND SCREEN: ABO/RH(D): B POS

## 2013-05-10 LAB — POCT FERN TEST: POCT Fern Test: POSITIVE

## 2013-05-10 MED ORDER — LACTATED RINGERS IV SOLN: 500.0000 mL | Freq: Once | INTRAVENOUS | Status: DC

## 2013-05-10 MED ORDER — BUTORPHANOL TARTRATE 1 MG/ML IJ SOLN: 1.0000 mg | INTRAMUSCULAR | Status: DC | PRN

## 2013-05-10 MED ORDER — LIDOCAINE HCL (PF) 1 % IJ SOLN: 30.0000 mL | INTRAMUSCULAR | Status: DC | PRN

## 2013-05-10 MED ORDER — FLEET ENEMA 7-19 GM/118ML RE ENEM: 1.0000 | ENEMA | RECTAL | Status: DC | PRN

## 2013-05-10 MED ORDER — PHENYLEPHRINE 40 MCG/ML (10ML) SYRINGE FOR IV PUSH (FOR BLOOD PRESSURE SUPPORT)
80.0000 ug | PREFILLED_SYRINGE | INTRAVENOUS | Status: DC | PRN
  Filled 2013-05-10: qty 5

## 2013-05-10 MED ORDER — PHENYLEPHRINE 40 MCG/ML (10ML) SYRINGE FOR IV PUSH (FOR BLOOD PRESSURE SUPPORT): 80.0000 ug | PREFILLED_SYRINGE | INTRAVENOUS | Status: DC | PRN

## 2013-05-10 MED ORDER — EPHEDRINE 5 MG/ML INJ: 10.0000 mg | INTRAVENOUS | Status: DC | PRN

## 2013-05-10 MED ORDER — ACETAMINOPHEN 325 MG PO TABS
650.0000 mg | ORAL_TABLET | ORAL | Status: DC | PRN
  Administered 2013-05-11 (×2): 650 mg via ORAL
  Filled 2013-05-10 (×2): qty 2

## 2013-05-10 MED ORDER — OXYTOCIN 40 UNITS IN LACTATED RINGERS INFUSION - SIMPLE MED: 62.5000 mL/h | INTRAVENOUS | Status: DC

## 2013-05-10 MED ORDER — DIPHENHYDRAMINE HCL 50 MG/ML IJ SOLN
12.5000 mg | INTRAMUSCULAR | Status: DC | PRN
  Administered 2013-05-12 (×2): 12.5 mg via INTRAVENOUS
  Filled 2013-05-10 (×2): qty 1

## 2013-05-10 MED ORDER — EPHEDRINE 5 MG/ML INJ
10.0000 mg | INTRAVENOUS | Status: DC | PRN
  Filled 2013-05-10: qty 4

## 2013-05-10 MED ORDER — TERBUTALINE SULFATE 1 MG/ML IJ SOLN: 0.2500 mg | Freq: Once | INTRAMUSCULAR | Status: AC | PRN

## 2013-05-10 MED ORDER — LACTATED RINGERS IV SOLN
INTRAVENOUS | Status: DC
  Administered 2013-05-10: 21:00:00 via INTRAVENOUS
  Administered 2013-05-11: 125 mL/h via INTRAVENOUS
  Administered 2013-05-11: 05:00:00 via INTRAVENOUS

## 2013-05-10 MED ORDER — OXYTOCIN BOLUS FROM INFUSION: 500.0000 mL | INTRAVENOUS | Status: DC

## 2013-05-10 MED ORDER — ONDANSETRON HCL 4 MG/2ML IJ SOLN: 4.0000 mg | Freq: Four times a day (QID) | INTRAMUSCULAR | Status: DC | PRN

## 2013-05-10 MED ORDER — CITRIC ACID-SODIUM CITRATE 334-500 MG/5ML PO SOLN
30.0000 mL | ORAL | Status: DC | PRN
  Administered 2013-05-12: 30 mL via ORAL
  Filled 2013-05-10: qty 15

## 2013-05-10 MED ORDER — LACTATED RINGERS IV SOLN
500.0000 mL | INTRAVENOUS | Status: DC | PRN
  Administered 2013-05-11: 1000 mL via INTRAVENOUS

## 2013-05-10 MED ORDER — OXYCODONE-ACETAMINOPHEN 5-325 MG PO TABS: 1.0000 | ORAL_TABLET | ORAL | Status: DC | PRN

## 2013-05-10 MED ORDER — OXYTOCIN 40 UNITS IN LACTATED RINGERS INFUSION - SIMPLE MED
1.0000 m[IU]/min | INTRAVENOUS | Status: DC
  Administered 2013-05-11 (×2): 1 m[IU]/min via INTRAVENOUS
  Filled 2013-05-10: qty 1000

## 2013-05-10 MED ORDER — IBUPROFEN 600 MG PO TABS: 600.0000 mg | ORAL_TABLET | Freq: Four times a day (QID) | ORAL | Status: DC | PRN

## 2013-05-10 MED ORDER — FENTANYL 2.5 MCG/ML BUPIVACAINE 1/10 % EPIDURAL INFUSION (WH - ANES)
14.0000 mL/h | INTRAMUSCULAR | Status: DC | PRN
  Administered 2013-05-11 – 2013-05-12 (×2): 14 mL/h via EPIDURAL
  Filled 2013-05-10 (×2): qty 125

## 2013-05-10 NOTE — MAU Note (Signed)
Possible ROM, started leaking around 1730, clear fluid- still coming. No bleeding. Had just been checked at dr's office was one cm.   Contracting about every .

## 2013-05-11 ENCOUNTER — Encounter (HOSPITAL_COMMUNITY): Payer: Self-pay | Admitting: *Deleted

## 2013-05-11 ENCOUNTER — Encounter (HOSPITAL_COMMUNITY): Payer: Self-pay | Admitting: Anesthesiology

## 2013-05-11 ENCOUNTER — Inpatient Hospital Stay (HOSPITAL_COMMUNITY): Payer: Commercial Managed Care - PPO | Admitting: Anesthesiology

## 2013-05-11 MED ORDER — LIDOCAINE HCL (PF) 1 % IJ SOLN
INTRAMUSCULAR | Status: DC | PRN
  Administered 2013-05-11 (×4): 4 mL

## 2013-05-11 MED ORDER — OXYTOCIN 40 UNITS IN LACTATED RINGERS INFUSION - SIMPLE MED
1.0000 m[IU]/min | INTRAVENOUS | Status: DC
  Administered 2013-05-11: 15 m[IU]/min via INTRAVENOUS

## 2013-05-11 MED ORDER — TERBUTALINE SULFATE 1 MG/ML IJ SOLN
INTRAMUSCULAR | Status: AC
  Administered 2013-05-11: 0.25 mg
  Filled 2013-05-11: qty 1

## 2013-05-11 NOTE — Progress Notes (Signed)
Patient ID: Samantha [name], female   DOB: Jan 19, 1986, 27 y.o.   MRN: [id number]  S: Pt feeling contractions more now O: Filed Vitals:   05/11/13 1706 05/11/13 1727 05/11/13 1803 05/11/13 1854  BP: 146/86 115/60 132/82   Pulse: 67 66 71 75  Temp:   97.7 F (36.5 C)   TempSrc:   Oral   Resp:  20 16   Height:      Weight:      SpO2:    100%   Cvx1-2/80/-2, soft toco Q5-9 FHT 130  IUPC placed without difficulty. Bloody fluid noted on exam greater than had previously been appreciated. Blood return in IUPC  FWB reassuring  A/P 1) Continue pitocin 2) FWB reassuring

## 2013-05-11 NOTE — Progress Notes (Signed)
Dr Dareen Piano updated on FHR, UC pattern, pitocin discontinuation, and nursing interventions.  Orders given to give pt terbutaline.  RN to give terbutaline and continue assessing.

## 2013-05-11 NOTE — H&P (Signed)
Samantha [name] is a 27 y.o. female presenting for leaking fluid  Pt confirmed SROM in MAU and admitted last night. On admission her cervic was called 2/70/-2. There has been no appreciable cervical change overnight History OB History   Grav Para Term Preterm Abortions TAB SAB Ect Mult Living   1              Past Medical History  Diagnosis Date  . PCOS (polycystic ovarian syndrome)   . Chlamydia 12/02/2011  . Diabetes mellitus     borderline  . Headache(784.0)   . Infection     UTI  . Kidney stone   . Fracture of left leg    Past Surgical History  Procedure Laterality Date  . Tibia fracture surgery     Family History: family history includes Asthma in her brother; Diabetes in her other; GER disease in her mother and sister; Hypertension in her other; and Ulcers in her mother. Social History:  reports that she has quit smoking. Her smoking use included Cigarettes. She has a 1 pack-year smoking history. She has never used smokeless tobacco. She reports that she does not drink alcohol or use illicit drugs.   Prenatal Transfer Tool  Maternal Diabetes: No Genetic Screening: Declined Maternal Ultrasounds/Referrals: Normal Fetal Ultrasounds or other Referrals:  None Maternal Substance Abuse:  Yes:  Type: Smoker Significant Maternal Medications:  None Significant Maternal Lab Results:  None Other Comments:  None  ROS  Dilation: 1 Effacement (%): 70 Station: -2 Exam by:: Dr Tenny Craw Blood pressure 134/86, pulse 78, temperature 97.7 F (36.5 C), temperature source Tympanic, resp. rate 20, height 5\' 5"  (1.651 m), weight 111.131 kg (245 lb), SpO2 100.00%. Exam Physical Exam  Prenatal labs: ABO, Rh: --/--/B POS, B POS (08/07 2120) Antibody: NEG (08/07 2120) Rubella: Immune (05/30 0000) RPR: NON REACTIVE (08/07 2120)  HBsAg: Negative (05/30 0000)  HIV: Non-reactive (05/30 0000)  GBS: Negative (08/07 0000)   Assessment/Plan: 1) Admit 2) Pitocin augmentation   Samantha [name]  H. 05/11/2013, 12:29 PM

## 2013-05-11 NOTE — Progress Notes (Signed)
Samantha [name] appliled

## 2013-05-11 NOTE — Progress Notes (Signed)
Attempted to place [place] on pt for monitoring without success

## 2013-05-11 NOTE — Anesthesia Procedure Notes (Signed)
Epidural Patient location during procedure: OB Start time: 05/11/2013 6:53 PM  Staffing Performed by: anesthesiologist   Preanesthetic Checklist Completed: patient identified, site marked, surgical consent, pre-op evaluation, timeout performed, IV checked, risks and benefits discussed and monitors and equipment checked  Epidural Patient position: sitting Prep: site prepped and draped and DuraPrep Patient monitoring: continuous pulse ox and blood pressure Approach: midline Injection technique: LOR air  Needle:  Needle type: Tuohy  Needle gauge: 17 G Needle length: 9 cm and 9 Needle insertion depth: 7 cm Catheter type: closed end flexible Catheter size: 19 Gauge Catheter at skin depth: 12 cm Test dose: negative  Assessment Events: blood not aspirated, injection not painful, no injection resistance, negative IV test and no paresthesia  Additional Notes Discussed risk of headache, infection, bleeding, nerve injury and failed or incomplete block.  Patient voices understanding and wishes to proceed.  Epidural placed easily on first attempt.  No paresthesia. Patient tolerated procedure well with no apparent complications.  Jasmine December, MDReason for block:procedure for pain

## 2013-05-11 NOTE — Progress Notes (Signed)
This RN in for another delivery from 1400-1700

## 2013-05-11 NOTE — Anesthesia Preprocedure Evaluation (Addendum)
Anesthesia Evaluation  Patient identified by MRN, date of birth, ID band Patient awake    Reviewed: Allergy & Precautions, H&P , NPO status , Patient's Chart, lab work & pertinent test results, reviewed documented beta blocker date and time   History of Anesthesia Complications Negative for: history of anesthetic complications  Airway Mallampati: II TM Distance: >3 FB Neck ROM: full    Dental  (+) Teeth Intact   Pulmonary former smoker,  breath sounds clear to auscultation        Cardiovascular negative cardio ROS  Rhythm:regular Rate:Normal     Neuro/Psych  Headaches, negative psych ROS   GI/Hepatic negative GI ROS, Neg liver ROS,   Endo/Other  diabetes (borderline)Morbid obesityPCOS  Renal/GU negative Renal ROS     Musculoskeletal   Abdominal   Peds  Hematology negative hematology ROS (+)   Anesthesia Other Findings   Reproductive/Obstetrics (+) Pregnancy                           Anesthesia Physical Anesthesia Plan  ASA: III  Anesthesia Plan: Epidural   Post-op Pain Management:    Induction:   Airway Management Planned:   Additional Equipment:   Intra-op Plan:   Post-operative Plan:   Informed Consent: I have reviewed the patients History and Physical, chart, labs and discussed the procedure including the risks, benefits and alternatives for the proposed anesthesia with the patient or authorized representative who has indicated his/her understanding and acceptance.     Plan Discussed with: CRNA  Anesthesia Plan Comments:        Anesthesia Quick Evaluation

## 2013-05-11 NOTE — Progress Notes (Signed)
  S: Pt not feeling contractions but continuing to leak fluid O: Filed Vitals:   05/11/13 1058 05/11/13 1129 05/11/13 1214 05/11/13 1224  BP: 123/77 129/89 134/86   Pulse: 71 92 78   Temp:   97.6 F (36.4 C) 97.7 F (36.5 C)  TempSrc:    Tympanic  Resp: 20 16 20    Height:      Weight:      SpO2:      Cv1/80/-2 FHT 130 reactive toco quiet  A/P Attempted to place IUPC but IUPC felt to be curling around vertex in LUS. Removed.  Cervix only 1 cm dilated. Patient wasn't  Started on pitocin until  5:30 this morning. Pt had prolonged decel at 6:30 and pit stopped until 8:30 when restarted.  Will increase 2x2 FWB reassuring

## 2013-05-12 ENCOUNTER — Encounter (HOSPITAL_COMMUNITY)
Admission: AD | Disposition: A | Discharge status: Home / Self Care | Payer: Self-pay | Source: Ambulatory Visit | Attending: Obstetrics and Gynecology

## 2013-05-12 ENCOUNTER — Encounter (HOSPITAL_COMMUNITY): Payer: Self-pay

## 2013-05-12 LAB — GLUCOSE, CAPILLARY

## 2013-05-12 SURGERY — Surgical Case
Anesthesia: Epidural | Site: Abdomen | Wound class: Clean Contaminated

## 2013-05-12 MED ORDER — METHYLERGONOVINE MALEATE 0.2 MG PO TABS: 0.2000 mg | ORAL_TABLET | ORAL | Status: DC | PRN

## 2013-05-12 MED ORDER — PHENYLEPHRINE HCL 10 MG/ML IJ SOLN
INTRAMUSCULAR | Status: DC | PRN
  Administered 2013-05-12 (×2): 40 ug via INTRAVENOUS

## 2013-05-12 MED ORDER — CHLOROPROCAINE HCL 3 % IJ SOLN
INTRAMUSCULAR | Status: AC
  Filled 2013-05-12: qty 20

## 2013-05-12 MED ORDER — LIDOCAINE-EPINEPHRINE (PF) 2 %-1:200000 IJ SOLN
INTRAMUSCULAR | Status: AC
  Filled 2013-05-12: qty 20

## 2013-05-12 MED ORDER — SODIUM CHLORIDE 0.9 % IJ SOLN: 3.0000 mL | INTRAMUSCULAR | Status: DC | PRN

## 2013-05-12 MED ORDER — SCOPOLAMINE 1 MG/3DAYS TD PT72
MEDICATED_PATCH | TRANSDERMAL | Status: AC
  Administered 2013-05-12: 1.5 mg via TRANSDERMAL
  Filled 2013-05-12: qty 1

## 2013-05-12 MED ORDER — SIMETHICONE 80 MG PO CHEW
80.0000 mg | CHEWABLE_TABLET | Freq: Three times a day (TID) | ORAL | Status: DC
  Administered 2013-05-13 – 2013-05-15 (×9): 80 mg via ORAL

## 2013-05-12 MED ORDER — LACTATED RINGERS IV SOLN
INTRAVENOUS | Status: DC | PRN
  Administered 2013-05-12: 07:00:00 via INTRAVENOUS

## 2013-05-12 MED ORDER — WITCH HAZEL-GLYCERIN EX PADS: 1.0000 "application " | MEDICATED_PAD | CUTANEOUS | Status: DC | PRN

## 2013-05-12 MED ORDER — 0.9 % SODIUM CHLORIDE (POUR BTL) OPTIME
TOPICAL | Status: DC | PRN
  Administered 2013-05-12: 1000 mL

## 2013-05-12 MED ORDER — MORPHINE SULFATE 0.5 MG/ML IJ SOLN
INTRAMUSCULAR | Status: AC
  Filled 2013-05-12: qty 10

## 2013-05-12 MED ORDER — METOCLOPRAMIDE HCL 5 MG/ML IJ SOLN: 10.0000 mg | Freq: Three times a day (TID) | INTRAMUSCULAR | Status: DC | PRN

## 2013-05-12 MED ORDER — OXYTOCIN 40 UNITS IN LACTATED RINGERS INFUSION - SIMPLE MED: 62.5000 mL/h | INTRAVENOUS | Status: AC

## 2013-05-12 MED ORDER — OXYTOCIN 10 UNIT/ML IJ SOLN
INTRAMUSCULAR | Status: AC
  Filled 2013-05-12: qty 4

## 2013-05-12 MED ORDER — HYDROMORPHONE HCL PF 1 MG/ML IJ SOLN
0.2500 mg | INTRAMUSCULAR | Status: DC | PRN
  Administered 2013-05-12: 0.5 mg via INTRAVENOUS

## 2013-05-12 MED ORDER — KETOROLAC TROMETHAMINE 30 MG/ML IJ SOLN: 30.0000 mg | Freq: Four times a day (QID) | INTRAMUSCULAR | Status: AC | PRN

## 2013-05-12 MED ORDER — ONDANSETRON HCL 4 MG/2ML IJ SOLN: 4.0000 mg | Freq: Three times a day (TID) | INTRAMUSCULAR | Status: DC | PRN

## 2013-05-12 MED ORDER — TETANUS-DIPHTH-ACELL PERTUSSIS 5-2.5-18.5 LF-MCG/0.5 IM SUSP
0.5000 mL | Freq: Once | INTRAMUSCULAR | Status: AC
  Administered 2013-05-13: 0.5 mL via INTRAMUSCULAR
  Filled 2013-05-12: qty 0.5

## 2013-05-12 MED ORDER — DIPHENHYDRAMINE HCL 25 MG PO CAPS: 25.0000 mg | ORAL_CAPSULE | ORAL | Status: DC | PRN

## 2013-05-12 MED ORDER — PROMETHAZINE HCL 25 MG/ML IJ SOLN: 6.2500 mg | INTRAMUSCULAR | Status: DC | PRN

## 2013-05-12 MED ORDER — NALOXONE HCL 1 MG/ML IJ SOLN
1.0000 ug/kg/h | INTRAVENOUS | Status: DC | PRN
  Filled 2013-05-12: qty 2

## 2013-05-12 MED ORDER — KETOROLAC TROMETHAMINE 60 MG/2ML IM SOLN
INTRAMUSCULAR | Status: AC
  Administered 2013-05-12: 60 mg via INTRAMUSCULAR
  Filled 2013-05-12: qty 2

## 2013-05-12 MED ORDER — SODIUM BICARBONATE 8.4 % IV SOLN
INTRAVENOUS | Status: AC
  Filled 2013-05-12: qty 50

## 2013-05-12 MED ORDER — MORPHINE SULFATE (PF) 0.5 MG/ML IJ SOLN
INTRAMUSCULAR | Status: DC | PRN
  Administered 2013-05-12: 4 mg via EPIDURAL

## 2013-05-12 MED ORDER — FENTANYL CITRATE 0.05 MG/ML IJ SOLN
INTRAMUSCULAR | Status: DC | PRN
  Administered 2013-05-12 (×2): 50 ug via INTRAVENOUS

## 2013-05-12 MED ORDER — DEXTROSE 5 % IV SOLN
INTRAVENOUS | Status: AC
  Filled 2013-05-12: qty 3000

## 2013-05-12 MED ORDER — SODIUM BICARBONATE 8.4 % IV SOLN
INTRAVENOUS | Status: DC | PRN
  Administered 2013-05-12: 5 mL via EPIDURAL

## 2013-05-12 MED ORDER — KETOROLAC TROMETHAMINE 60 MG/2ML IM SOLN: 60.0000 mg | Freq: Once | INTRAMUSCULAR | Status: AC | PRN

## 2013-05-12 MED ORDER — DIPHENHYDRAMINE HCL 50 MG/ML IJ SOLN: 25.0000 mg | INTRAMUSCULAR | Status: DC | PRN

## 2013-05-12 MED ORDER — CHLOROPROCAINE HCL 3 % IJ SOLN
INTRAMUSCULAR | Status: DC | PRN
  Administered 2013-05-12: 30 mL

## 2013-05-12 MED ORDER — MENTHOL 3 MG MT LOZG: 1.0000 | LOZENGE | OROMUCOSAL | Status: DC | PRN

## 2013-05-12 MED ORDER — HYDROMORPHONE HCL PF 1 MG/ML IJ SOLN
INTRAMUSCULAR | Status: AC
  Administered 2013-05-12: 0.5 mg via INTRAVENOUS
  Filled 2013-05-12: qty 1

## 2013-05-12 MED ORDER — ZOLPIDEM TARTRATE 5 MG PO TABS: 5.0000 mg | ORAL_TABLET | Freq: Every evening | ORAL | Status: DC | PRN

## 2013-05-12 MED ORDER — PHENYLEPHRINE 40 MCG/ML (10ML) SYRINGE FOR IV PUSH (FOR BLOOD PRESSURE SUPPORT)
PREFILLED_SYRINGE | INTRAVENOUS | Status: AC
  Filled 2013-05-12: qty 5

## 2013-05-12 MED ORDER — NALOXONE HCL 0.4 MG/ML IJ SOLN: 0.4000 mg | INTRAMUSCULAR | Status: DC | PRN

## 2013-05-12 MED ORDER — METHYLERGONOVINE MALEATE 0.2 MG/ML IJ SOLN: 0.2000 mg | INTRAMUSCULAR | Status: DC | PRN

## 2013-05-12 MED ORDER — ONDANSETRON HCL 4 MG/2ML IJ SOLN: 4.0000 mg | INTRAMUSCULAR | Status: DC | PRN

## 2013-05-12 MED ORDER — KETOROLAC TROMETHAMINE 30 MG/ML IJ SOLN
30.0000 mg | Freq: Four times a day (QID) | INTRAMUSCULAR | Status: AC | PRN
  Administered 2013-05-12: 30 mg via INTRAVENOUS
  Filled 2013-05-12: qty 1

## 2013-05-12 MED ORDER — PRENATAL MULTIVITAMIN CH
1.0000 | ORAL_TABLET | Freq: Every day | ORAL | Status: DC
  Administered 2013-05-13 – 2013-05-14 (×2): 1 via ORAL
  Filled 2013-05-12 (×2): qty 1

## 2013-05-12 MED ORDER — SENNOSIDES-DOCUSATE SODIUM 8.6-50 MG PO TABS
2.0000 | ORAL_TABLET | Freq: Every day | ORAL | Status: DC
  Administered 2013-05-12 – 2013-05-14 (×3): 2 via ORAL

## 2013-05-12 MED ORDER — LANOLIN HYDROUS EX OINT: 1.0000 "application " | TOPICAL_OINTMENT | CUTANEOUS | Status: DC | PRN

## 2013-05-12 MED ORDER — LACTATED RINGERS IV SOLN
INTRAVENOUS | Status: DC
  Administered 2013-05-12: 19:00:00 via INTRAVENOUS

## 2013-05-12 MED ORDER — OXYCODONE-ACETAMINOPHEN 5-325 MG PO TABS
1.0000 | ORAL_TABLET | ORAL | Status: DC | PRN
  Administered 2013-05-13 – 2013-05-14 (×4): 2 via ORAL
  Administered 2013-05-14 (×2): 1 via ORAL
  Administered 2013-05-14 (×3): 2 via ORAL
  Administered 2013-05-14 – 2013-05-15 (×3): 1 via ORAL
  Filled 2013-05-12 (×2): qty 1
  Filled 2013-05-12 (×7): qty 2
  Filled 2013-05-12: qty 1
  Filled 2013-05-12: qty 2
  Filled 2013-05-12: qty 1

## 2013-05-12 MED ORDER — DEXTROSE 5 % IV SOLN
3.0000 g | INTRAVENOUS | Status: DC | PRN
  Administered 2013-05-12: 3 g via INTRAVENOUS

## 2013-05-12 MED ORDER — DIPHENHYDRAMINE HCL 50 MG/ML IJ SOLN: 12.5000 mg | INTRAMUSCULAR | Status: DC | PRN

## 2013-05-12 MED ORDER — DIPHENHYDRAMINE HCL 25 MG PO CAPS: 25.0000 mg | ORAL_CAPSULE | Freq: Four times a day (QID) | ORAL | Status: DC | PRN

## 2013-05-12 MED ORDER — NALBUPHINE HCL 10 MG/ML IJ SOLN
5.0000 mg | INTRAMUSCULAR | Status: DC | PRN
  Filled 2013-05-12 (×5): qty 1

## 2013-05-12 MED ORDER — PNEUMOCOCCAL VAC POLYVALENT 25 MCG/0.5ML IJ INJ
0.5000 mL | INJECTION | INTRAMUSCULAR | Status: AC
  Filled 2013-05-12: qty 0.5

## 2013-05-12 MED ORDER — SIMETHICONE 80 MG PO CHEW: 80.0000 mg | CHEWABLE_TABLET | ORAL | Status: DC | PRN

## 2013-05-12 MED ORDER — DIBUCAINE 1 % RE OINT: 1.0000 "application " | TOPICAL_OINTMENT | RECTAL | Status: DC | PRN

## 2013-05-12 MED ORDER — ONDANSETRON HCL 4 MG/2ML IJ SOLN
INTRAMUSCULAR | Status: DC | PRN
  Administered 2013-05-12: 4 mg via INTRAVENOUS

## 2013-05-12 MED ORDER — ONDANSETRON HCL 4 MG PO TABS: 4.0000 mg | ORAL_TABLET | ORAL | Status: DC | PRN

## 2013-05-12 MED ORDER — MORPHINE SULFATE (PF) 0.5 MG/ML IJ SOLN
INTRAMUSCULAR | Status: DC | PRN
  Administered 2013-05-12: 1 mg via INTRAVENOUS

## 2013-05-12 MED ORDER — MEPERIDINE HCL 25 MG/ML IJ SOLN: 6.2500 mg | INTRAMUSCULAR | Status: DC | PRN

## 2013-05-12 MED ORDER — IBUPROFEN 600 MG PO TABS
600.0000 mg | ORAL_TABLET | Freq: Four times a day (QID) | ORAL | Status: DC
  Administered 2013-05-13 – 2013-05-15 (×10): 600 mg via ORAL
  Filled 2013-05-12 (×10): qty 1

## 2013-05-12 MED ORDER — SCOPOLAMINE 1 MG/3DAYS TD PT72: 1.0000 | MEDICATED_PATCH | Freq: Once | TRANSDERMAL | Status: AC

## 2013-05-12 MED ORDER — NALBUPHINE HCL 10 MG/ML IJ SOLN
5.0000 mg | INTRAMUSCULAR | Status: DC | PRN
  Administered 2013-05-12: 10 mg via INTRAVENOUS
  Administered 2013-05-12 – 2013-05-13 (×2): 5 mg via INTRAVENOUS
  Filled 2013-05-12: qty 1

## 2013-05-12 MED ORDER — ONDANSETRON HCL 4 MG/2ML IJ SOLN
INTRAMUSCULAR | Status: AC
  Filled 2013-05-12: qty 2

## 2013-05-12 MED ORDER — FENTANYL CITRATE 0.05 MG/ML IJ SOLN
INTRAMUSCULAR | Status: AC
  Filled 2013-05-12: qty 2

## 2013-05-12 SURGICAL SUPPLY — 34 items
ADH SKN CLS APL DERMABOND .7 (GAUZE/BANDAGES/DRESSINGS)
ADH SKN CLS LQ APL DERMABOND (GAUZE/BANDAGES/DRESSINGS) ×1
CLAMP CORD UMBIL (MISCELLANEOUS) IMPLANT
CLOTH BEACON ORANGE TIMEOUT ST (SAFETY) ×2 IMPLANT
DERMABOND ADHESIVE PROPEN (GAUZE/BANDAGES/DRESSINGS) ×1
DERMABOND ADVANCED (GAUZE/BANDAGES/DRESSINGS)
DERMABOND ADVANCED .7 DNX12 (GAUZE/BANDAGES/DRESSINGS) IMPLANT
DERMABOND ADVANCED .7 DNX6 (GAUZE/BANDAGES/DRESSINGS) IMPLANT
DRAPE LG THREE QUARTER DISP (DRAPES) ×2 IMPLANT
DRSG OPSITE POSTOP 4X10 (GAUZE/BANDAGES/DRESSINGS) ×2 IMPLANT
DURAPREP 26ML APPLICATOR (WOUND CARE) ×2 IMPLANT
ELECT REM PT RETURN 9FT ADLT (ELECTROSURGICAL) ×2
ELECTRODE REM PT RTRN 9FT ADLT (ELECTROSURGICAL) ×1 IMPLANT
EXTRACTOR VACUUM M CUP 4 TUBE (SUCTIONS) IMPLANT
GLOVE BIO SURGEON STRL SZ7 (GLOVE) ×2 IMPLANT
GOWN STRL REIN XL XLG (GOWN DISPOSABLE) ×5 IMPLANT
KIT ABG SYR 3ML LUER SLIP (SYRINGE) IMPLANT
NDL HYPO 25X5/8 SAFETYGLIDE (NEEDLE) IMPLANT
NEEDLE HYPO 25X5/8 SAFETYGLIDE (NEEDLE) IMPLANT
NS IRRIG 1000ML POUR BTL (IV SOLUTION) ×2 IMPLANT
PACK C SECTION WH (CUSTOM PROCEDURE TRAY) ×2 IMPLANT
PAD OB MATERNITY 4.3X12.25 (PERSONAL CARE ITEMS) ×2 IMPLANT
RTRCTR C-SECT PINK 25CM LRG (MISCELLANEOUS) ×2 IMPLANT
STAPLER VISISTAT 35W (STAPLE) IMPLANT
SUT CHROMIC 1 CTX 36 (SUTURE) ×5 IMPLANT
SUT PDS AB 0 CTX 60 (SUTURE) ×2 IMPLANT
SUT VIC AB 0 CT1 27 (SUTURE) ×2
SUT VIC AB 0 CT1 27XBRD ANBCTR (SUTURE) IMPLANT
SUT VIC AB 2-0 CT1 27 (SUTURE) ×2
SUT VIC AB 2-0 CT1 TAPERPNT 27 (SUTURE) ×1 IMPLANT
SUT VIC AB 4-0 KS 27 (SUTURE) IMPLANT
TOWEL OR 17X24 6PK STRL BLUE (TOWEL DISPOSABLE) ×2 IMPLANT
TRAY FOLEY CATH 14FR (SET/KITS/TRAYS/PACK) ×1 IMPLANT
WATER STERILE IRR 1000ML POUR (IV SOLUTION) ×2 IMPLANT

## 2013-05-12 NOTE — Progress Notes (Signed)
Spoke with Dr. Tenny Craw and notified her of SVE and tracing.  Pt now states that she is ready for c/s and MD was notified.  Pitocin to be turned off x 30-60 min then restart @ 15mu/min and go up by 2 mu/min

## 2013-05-12 NOTE — OR Nursing (Signed)
Foley in upon arrival to OR. Urine color-yellow.

## 2013-05-12 NOTE — OR Nursing (Addendum)
Uterus massaged by S. Benna Arno RN.   25 cc of blood evacuated from uterus during uterine massage.  Two tubes  of cord blood sent to lab. 

## 2013-05-12 NOTE — Progress Notes (Signed)
Patient ID: Samantha [name], female   DOB: 1985-12-04, 27 y.o.   MRN: [id number]  S: Pt has had a protracted latent stage of labor. Upon admission she was felt to be 2cm dilated. 14 hours later she was examined and found to be 1 cm dilated. AN IUPC was placed and the patient made it up to 33 mun of pitocin.  Pattern became more regular. She has Now been in adequate labor and her cervix is only 3 cm dilated. Caput has developed and the vertex remains high in the pelvis. FHT 140-150 reactive cvx 3/80/-2 toco Q3-4  A/P 1) Will proceed with CS for failure to progress. R/B/A have been discussed and patient wishes to proceed

## 2013-05-12 NOTE — Anesthesia Postprocedure Evaluation (Signed)
Anesthesia Post Note  Patient: Samantha [name]  Procedure(s) Performed: Procedure(s) (LRB): Primary cesarean section with delivery of baby boy at 15. (N/A)  Anesthesia type: Epidural  Patient location: PACU  Post pain: Pain level controlled  Post assessment: Post-op Vital signs reviewed  Last Vitals: BP 137/95  Pulse 67  Temp(Src) 36.9 C (Oral)  Resp 18  Ht 5\' 5"  (1.651 m)  Wt 245 lb (111.131 kg)  BMI 40.77 kg/m2  SpO2 99%  Post vital signs: Reviewed  Level of consciousness: sedated  Complications: No apparent anesthesia complications

## 2013-05-12 NOTE — Anesthesia Postprocedure Evaluation (Signed)
2 Anesthesia Post-op Note  Patient: Samantha [name]  Procedure(s) Performed: Procedure(s): Primary cesarean section with delivery of baby boy at 75. (N/A)  Patient Location: [facility]  Anesthesia Type:Epidural  Level of Consciousness: awake, alert  and oriented  Airway and Oxygen Therapy: Patient Spontanous Breathing  Post-op Pain: mild  Post-op Assessment: Patient's Cardiovascular Status Stable, Respiratory Function Stable, Patent Airway, No signs of Nausea or vomiting, Pain level controlled, No headache, No backache, No residual numbness and No residual motor weakness  Post-op Vital Signs: stable  Complications: No apparent anesthesia complications

## 2013-05-12 NOTE — Transfer of Care (Signed)
Immediate Anesthesia Transfer of Care Note  Patient: Samantha [name]  Procedure(s) Performed: Procedure(s): Primary cesarean section with delivery of baby boy at 39. (N/A)  Patient Location: PACU  Anesthesia Type:Epidural  Level of Consciousness: awake, alert  and oriented  Airway & Oxygen Therapy: Patient Spontanous Breathing  Post-op Assessment: Report given to PACU RN and Post -op Vital signs reviewed and stable  Post vital signs: Reviewed and stable  Complications: No apparent anesthesia complications

## 2013-05-12 NOTE — Op Note (Signed)
Pre-Operative Diagnosis: 1) 38+6 week intrauterine pregnancy 2) Failure to preogress Postoperative Diagnosis: Same and thick meconium stained fluid Procedure: Primary Low Transverse Cesarean Section Surgeon: Dr. Waynard Reeds Assistant: None Operative Findings: Female infant in the vertex presentation with thick meconium stained fluid. Normal ovaries and tubes Specimen: Placenta to pathology EBL: Total I/O In: 1000 [I.V.:1000] Out: 950 [Urine:300; Blood:650]   Procedure:Samantha [name] is an 27 year old gravida 1 para 0 at 79 weeks and 6 days estimated gestational age who presents for cesarean section. Patient was admitted on 8/7 for spontaneous rupture of membranes. She never advanced in cervical dilation past 3 cm despite adequate labor by IUPC. Following the appropriate informed consent the patient was brought to the operating room where spinal anesthesia was administered and found to be adequate. She was placed in the dorsal supine position with a leftward tilt. She was prepped and draped in the normal sterile fashion. Scalpel was then used to make a Pfannenstiel skin incision which was carried down to the underlying layers of soft tissue to the fascia. The fascia was incised in the midline and the fascial incision was extended laterally with Mayo scissors. The superior aspect of the fascial incision was grasped with Coker clamps x2, tented up and the rectus muscles dissected off sharply with the electrocautery unit area and the same procedure was repeated on the inferior aspect of the fascial incision. The rectus muscles were separated in the midline. The abdominal peritoneum was identified, tented up, entered sharply, and the incision was extended superiorly and inferiorly with good visualization of the bladder. The Alexis retractor was then deployed. The vesicouterine peritoneum was identified, tented up, entered sharply, and the bladder flap was created digitally. Scalpel was then used to make a low  transverse incision on the uterus which was extended laterally with blunt dissection. Thick Meconium fluid was noted. The fetal vertex was identified, delivered easily through the uterine incision followed by the body. The infant was bulb suctioned on the operative field, cord was clamped and cut and the infant was passed to the waiting neonatologist. Placenta was then delivered spontaneously, the uterus was cleared of all clot and debris. The uterine incision was repaired with #1 chromic in running locked fashion followed by a second imbricating layer. Ovaries and tubes were inspected and normal. The Alexis retractor was removed. The uterus was returned to the abdominal cavity the abdominal cavity was cleared of all clot and debris. The abdominal peritoneum was reapproximated with 2-0 Vicryl in a running fashion, the rectus muscles was reapproximated with #1 chromic in a running fashion. The fascia was closed with a looped PDS in a running fashion. The skin was closed with 4-0 vicryl in a subcuticular fashion and Dermabon. All sponge lap and needle counts were correct x2. Patient tolerated the procedure well and recovered in stable condition following the procedure.

## 2013-05-13 LAB — CBC
HCT: 32.8 % — ABNORMAL LOW (ref 36.0–46.0)
Platelets: 285 10*3/uL (ref 150–400)
RDW: 17.2 % — ABNORMAL HIGH (ref 11.5–15.5)
WBC: 10.3 10*3/uL (ref 4.0–10.5)

## 2013-05-13 NOTE — Progress Notes (Signed)
Subjective: Postpartum Day 1: Cesarean Delivery Patient reports feeling well. Pain controlled, bleeding appropriate. Pt Seen in NICU  Objective: Vital signs in last 24 hours: Temp:  [97.5 F (36.4 C)-98.6 F (37 C)] 97.8 F (36.6 C) (08/10 0438) Pulse Rate:  [66-92] 78 (08/10 0438) Resp:  [18-20] 20 (08/10 0438) BP: (106-139)/(66-95) 115/66 mmHg (08/10 0438) SpO2:  [98 %-100 %] 99 % (08/10 0438)  Physical Exam:  General: alert, cooperative and appears stated age Lochia: appropriate Uterine Fundus: firm Incision: healing well DVT Evaluation: No evidence of DVT seen on physical exam.   Recent Labs  05/10/13 2120 05/13/13 0502  HGB 12.9 10.7*  HCT 38.6 32.8*    Assessment/Plan: Status post Cesarean section. Doing well postoperatively.  Continue current care. Baby still in NICU but doing well, now extubated and breathing on room air  Lorrain Rivers H. 05/13/2013, 12:06 PM

## 2013-05-13 NOTE — Clinical Social Work Maternal (Signed)
     Clinical Social Work Department PSYCHOSOCIAL ASSESSMENT - MATERNAL/CHILD 05/13/2013  Patient:  Marling,Kalandra I  Account Number:  [id number]  Admit Date:  05/10/2013  Marjo Bicker Name:   Angola Pfefferle    Clinical Social Worker:  Truman [place], [place]   Date/Time:  05/13/2013 10:00 AM  Date Referred:  05/13/2013   Referral source  Physician     Referred reason  NICU   Other referral source:    I:  FAMILY / HOME ENVIRONMENT Marjo Bicker legal guardian:  PARENT  Guardian - Name Guardian - Age Guardian - Address  Narda Fundora [address] [place], [place] [address]  Mr. Poteet     Other household support members/support persons Name Relationship DOB  no other children     Other support:   MOB reports good family support    II  PSYCHOSOCIAL DATA Information Source:  Patient Interview  Insurance claims handler Resources Employment:   MOB: [employer]  FOB: Writer resources:  Media planner If [employer] - County:    School / Grade:   Maternity Care Coordinator / Child Services Coordination / Early Interventions:  Cultural issues impacting care:    III  STRENGTHS Strengths  Adequate Resources  Home prepared for Child (including basic supplies)  Supportive family/friends  Compliance with medical plan  Understanding of illness   Strength comment:    IV  RISK FACTORS AND CURRENT PROBLEMS Current Problem:  None   Risk Factor & Current Problem Patient Issue Family Issue Risk Factor / Current Problem Comment   N N     V  SOCIAL WORK ASSESSMENT CSW met with MOB in room. CSW discussed infant admission to NICU. MOB reports appropriate emotions around admission and feels she has good communication with doctors and nurses. CSW discussed with MOB any emotional concerns. MOB reports no significant emotional concerns at this time. CSW discussed PPD. MOB reports knowing what symptoms to look out for.  CSW discussed supplies and family support.  MOB reports good family support. MOB reports no concerns with supplies at this time.  . No other social concerns at this time. MOB was instructed to let RN or CSW know if any further concerns arise. CSW continues to follow while infant in NICU.      VI SOCIAL WORK PLAN Social Work Plan  Psychosocial Support/Ongoing Assessment of Needs   Type of pt/family education:   If child protective services report - county:   If child protective services report - date:   Information/referral to community resources comment:   Other social work plan:

## 2013-05-14 ENCOUNTER — Encounter (HOSPITAL_COMMUNITY): Payer: Self-pay | Admitting: Obstetrics and Gynecology

## 2013-05-14 MED ORDER — PNEUMOCOCCAL VAC POLYVALENT 25 MCG/0.5ML IJ INJ
0.5000 mL | INJECTION | INTRAMUSCULAR | Status: AC
  Administered 2013-05-15: 0.5 mL via INTRAMUSCULAR
  Filled 2013-05-14: qty 0.5

## 2013-05-14 NOTE — Progress Notes (Signed)
Called by NICU nurse:  Pecola Leisure will be ready for circ tomorrow.

## 2013-05-14 NOTE — Progress Notes (Signed)
Post Op Day 2 Subjective: no complaints  Objective: Blood pressure 130/76, pulse 84, temperature 97.7 F (36.5 C), temperature source Oral, resp. rate 18, height 5\' 5"  (1.651 m), weight 111.131 kg (245 lb), SpO2 100.00%, unknown if currently breastfeeding.  Physical Exam:  General: alert Lochia: appropriate Uterine Fundus: firm Incision: no significant drainage   Recent Labs  05/13/13 0502  HGB 10.7*  HCT 32.8*    Assessment/Plan: Plan for discharge tomorrow.   Baby doing well in NICU.   LOS: 4 days   Authur Cubit D 05/14/2013, 1:07 PM

## 2013-05-15 MED ORDER — OXYCODONE-ACETAMINOPHEN 5-325 MG PO TABS: 1.0000 | ORAL_TABLET | ORAL | Status: DC | PRN

## 2013-05-15 NOTE — Discharge Summary (Signed)
Obstetric Discharge Summary Reason for Admission: rupture of membranes Prenatal Procedures: none Intrapartum Procedures: cesarean: low cervical, transverse - FTP Postpartum Procedures: none Complications-Operative and Postpartum: none Hemoglobin  Date Value Range Status  05/13/2013 10.7* 12.0 - 15.0 g/dL Final     HCT  Date Value Range Status  05/13/2013 32.8* 36.0 - 46.0 % Final    Physical Exam:  General: alert and cooperative Lochia: appropriate Uterine Fundus: firm Incision: healing well, no significant drainage DVT Evaluation: No evidence of DVT seen on physical exam.  Discharge Diagnoses: Term Pregnancy-delivered  Discharge Information: Date: 05/15/2013 Activity: pelvic rest Diet: routine Medications: PNV, Ibuprofen, Colace and Percocet Condition: stable Instructions: refer to practice specific booklet Discharge to: home Follow-up Information   Follow up with Almon Hercules., MD In 2 weeks.   Contact information:   [address] [place] [address] [phone]       Newborn Data: Live born female  Birth Weight: 6 lb 5.6 oz (2880 g) APGAR: 1, 6  Home with mother.  Philip Aspen 05/15/2013, 9:03 AM

## 2013-05-15 NOTE — Progress Notes (Signed)
Pt ambulated out without any problems.  Teaching complete, all questions answered. 

## 2013-05-15 NOTE — Progress Notes (Signed)
Ur chart review completed.  

## 2013-05-18 ENCOUNTER — Ambulatory Visit: Payer: Self-pay

## 2013-05-18 NOTE — Lactation Note (Signed)
This note was copied from the chart of Samantha [name]. Infant Lactation Consultation Outpatient Visit Note  Patient Name: Samantha [name] Date of Birth: 05/12/2012 Birth Weight:  6 lb 5.6 oz (2880 g)                  todays weight 6 - 14.6 Gestational Age at Delivery: Gestational Age: [redacted]w[redacted]d Type of Delivery:   Breastfeeding History Frequency of Breastfeeding: 4-5 times a day Length of Feeding: 3-5 minutes Voids: wnl Stools: wnl  Supplementing / Method: Pumping:  Type of Pump:Ameda DEP Ameda   Frequency:4-5 times a day  Volume:   30-60 mls at a time  Comments:    Consultation Evaluation:  Follow up visit with this mom and baby, now 93 days old, and 39 5/[redacted] weeks gestation. Mom has been trying to latch baby about 5 times a day, but he was fussy and would not maintain a latch. I fitted mom for a 24 nipple shield, and than a 20 nipple shield. Mom's breast tissue has softened greatly, and her nipples are now erect, and she has been wearing the inverted nipple shells  faithfully. Samantha is capable of latching to mom's breast without the shield, but for now needs the shield to maintain a latch and suckle.  Samantha latched well with shield, and transferred with audible swallows, but got sleepy. I then had mom pump with DEP, and she expressed 7 additional ounces of milk, and was still dripping at the time we stopped. I suggested mom breast feed on cue with 20  or 24 nipple shield (mom will try both and decide which he does best with), and then supplement with EBM. Mom will post pump  after breast feeding, and use this milk for next feed. With the amount of milk mom pumped today, she may not need to supplement, if Samantha begins to transfer better. Mom is coming in on 05/21/13 at 4pm for a weight check.  Mom will avoid formula, and supplement as needed with EBM.  Initial Feeding Assessment: Pre-feed [id number] Post-feed [id number] Amount Transferred:28 Comments: 24 nipple shield used with good  results  Additional Feeding Assessment: Pre-feed [id number] Post-feed [id number] Amount Transferred:10 Comments:  Additional Feeding Assessment: Pre-feed Weight: Post-feed Weight: Amount Transferred: Comments:  Total Breast milk Transferred this Visit: 38 mls Total Supplement Given: 20 mls EBM  Additional Interventions:   Follow-Up  Monday, 8/18 at 4 pm for a weight check      Alfred Levins 05/18/2013, 4:11 PM

## 2014-01-23 ENCOUNTER — Ambulatory Visit
Admission: RE | Admit: 2014-01-23 | Discharge: 2014-01-23 | Disposition: A | Discharge status: Home / Self Care | Payer: Commercial Managed Care - PPO | Source: Ambulatory Visit | Attending: General Practice | Admitting: General Practice

## 2014-01-23 ENCOUNTER — Encounter (INDEPENDENT_AMBULATORY_CARE_PROVIDER_SITE_OTHER): Payer: Self-pay

## 2014-01-23 ENCOUNTER — Other Ambulatory Visit: Payer: Self-pay | Admitting: General Practice

## 2014-01-23 DIAGNOSIS — M79661 Pain in right lower leg: Secondary | ICD-10-CM

## 2014-04-29 ENCOUNTER — Encounter (HOSPITAL_COMMUNITY): Payer: Self-pay | Admitting: Emergency Medicine

## 2014-04-29 ENCOUNTER — Emergency Department (HOSPITAL_COMMUNITY)
Admission: EM | Admit: 2014-04-29 | Discharge: 2014-04-29 | Disposition: A | Discharge status: Home / Self Care | Payer: Commercial Managed Care - PPO | Attending: Emergency Medicine | Admitting: Emergency Medicine

## 2014-04-29 DIAGNOSIS — M25579 Pain in unspecified ankle and joints of unspecified foot: Secondary | ICD-10-CM | POA: Insufficient documentation

## 2014-04-29 DIAGNOSIS — M25561 Pain in right knee: Secondary | ICD-10-CM

## 2014-04-29 DIAGNOSIS — M25569 Pain in unspecified knee: Secondary | ICD-10-CM | POA: Diagnosis not present

## 2014-04-29 DIAGNOSIS — F17200 Nicotine dependence, unspecified, uncomplicated: Secondary | ICD-10-CM | POA: Diagnosis not present

## 2014-04-29 DIAGNOSIS — Z8619 Personal history of other infectious and parasitic diseases: Secondary | ICD-10-CM | POA: Insufficient documentation

## 2014-04-29 DIAGNOSIS — Z8639 Personal history of other endocrine, nutritional and metabolic disease: Secondary | ICD-10-CM | POA: Insufficient documentation

## 2014-04-29 DIAGNOSIS — Z8612 Personal history of poliomyelitis: Secondary | ICD-10-CM | POA: Diagnosis not present

## 2014-04-29 DIAGNOSIS — M79671 Pain in right foot: Secondary | ICD-10-CM

## 2014-04-29 DIAGNOSIS — M25469 Effusion, unspecified knee: Secondary | ICD-10-CM | POA: Diagnosis not present

## 2014-04-29 DIAGNOSIS — Z8781 Personal history of (healed) traumatic fracture: Secondary | ICD-10-CM | POA: Insufficient documentation

## 2014-04-29 DIAGNOSIS — Z87442 Personal history of urinary calculi: Secondary | ICD-10-CM | POA: Insufficient documentation

## 2014-04-29 DIAGNOSIS — Z862 Personal history of diseases of the blood and blood-forming organs and certain disorders involving the immune mechanism: Secondary | ICD-10-CM | POA: Diagnosis not present

## 2014-04-29 MED ORDER — HYDROCODONE-ACETAMINOPHEN 5-325 MG PO TABS: 2.0000 | ORAL_TABLET | Freq: Four times a day (QID) | ORAL | Status: DC | PRN

## 2014-04-29 NOTE — ED Notes (Signed)
Patient c/o Right leg pain and swelling. States onset @1  week ago with knee swelling. Denies injury

## 2014-04-29 NOTE — ED Provider Notes (Signed)
CSN: [id number]     Arrival date & time 04/29/14  1854 History  This chart was scribed for non-physician practitioner working with Audree CamelScott T Goldston, MD, by Modena JanskyAlbert Thayil, ED Scribe. This patient was seen in room WTR7/WTR7 and the patient's care was started at 9:17 PM   Chief Complaint  Patient presents with  . Leg Pain    right   HPI HPI Comments: Samantha [name] is a 28 y.o. female who presents to the Emergency Department complaining of constant moderate right leg pain that started a week ago. She reports associated knee swelling that started a week ago. She denies any injury. She took ibuprofen, tylenol, and iced affected area with no relief. She states that she has had a hx injury to her left lower extremity in the past and feels that she may have put more weight on her RLE to compensate for the injury. She denies any recent surgeries or prolonged travel in the past 4 weeks. She denies any hx of DVT/PE. She also denies any numbness fever, chills, SOB, or chest pain.  Past Medical History  Diagnosis Date  . PCOS (polycystic ovarian syndrome)   . Chlamydia 12/02/2011  . Headache(784.0)   . Infection     UTI  . Fracture of left leg   . Kidney stone     Kidney stone  . Diabetes mellitus     borderline no meds cbg's blood suger testing normal with pregnancy    Past Surgical History  Procedure Laterality Date  . Tibia fracture surgery    . Cesarean section N/A 05/12/2013    Procedure: Primary cesarean section with delivery of baby boy at 0754.;  Surgeon: Freddrick MarchKendra H. Tenny Crawoss, MD;  Location: [facility];  Service: Obstetrics;  Laterality: N/A;   Family History  Problem Relation Age of Onset  . Hypertension Other   . Diabetes Other   . Ulcers Mother   . GER disease Mother   . GER disease Sister   . Asthma Brother    History  Substance Use Topics  . Smoking status: Current Every Day Smoker -- 0.25 packs/day for 2 years    Types: Cigarettes  . Smokeless tobacco: Never Used     Comment: quit  with preg  . Alcohol Use: No     Comment: socially none since pregnancy   OB History   Grav Para Term Preterm Abortions TAB SAB Ect Mult Living   1 1 1       1      Review of Systems  Constitutional: Negative for fever and chills.  Respiratory: Negative for shortness of breath.   Cardiovascular: Negative for chest pain.       Right knee swelling  Musculoskeletal: Positive for arthralgias.    Allergies  Review of patient's allergies indicates no known allergies.  Home Medications   Prior to Admission medications   Not on File   BP 123/82  Pulse 97  Temp(Src) 98.9 F (37.2 C) (Oral)  Resp 16  SpO2 99%  LMP 08/30/2013 Physical Exam  Nursing note and vitals reviewed. Constitutional: She is oriented to person, place, and time. She appears well-developed and well-nourished. No distress.  HENT:  Head: Normocephalic and atraumatic.  Mouth/Throat: Oropharynx is clear and moist.  Neck: Neck supple. No tracheal deviation present.  Cardiovascular: Normal rate, regular rhythm and normal heart sounds.   Pulmonary/Chest: Effort normal and breath sounds normal. No respiratory distress.  Musculoskeletal: Normal range of motion. She exhibits tenderness. She exhibits no  edema.  No TTP to right knee. No edema, erythema or warmth. Full ROM. No TTP to right calf. No erythema, edema, or warmth. Negative Homans test. +2 distal dorsal pedis pulse. Sensations in all right toes are intact. Some TTP Dorsal aspect of the right foot with no erythema, edema or warmth.   Neurological: She is alert and oriented to person, place, and time.  Skin: Skin is warm and dry.  Psychiatric: She has a normal mood and affect. Her behavior is normal.    ED Course  Procedures (including critical care time) DIAGNOSTIC STUDIES: Oxygen Saturation is 99% on RA, normal by my interpretation.    COORDINATION OF CARE: 9:21 PM- Pt advised of plan for treatment and pt agrees.  Labs Review Labs Reviewed - No data  to display  Imaging Review No results found.   EKG Interpretation None      MDM   Final diagnoses:  None  Patient presenting with pain of the right knee and right foot.  No injury.  No signs of infection on exam.  Patient afebrile.  Suspect arthritis.  No calf pain.  Negative Homan's sign.  Feel that the patient is stable for discharge.  Instructed to follow up with PCP.  Return precautions given.      Santiago Glad, PA-C 05/01/14 1429

## 2014-04-29 NOTE — Discharge Instructions (Signed)
Be sure to read and understand instructions below prior to leaving the hospital. If your symptoms persist without any improvement in 1 week it is reccommended that you follow up with orthopedics listed above. Use your pain medication as prescribed and do not operate heavy machinery while on pain medication. Note that your pain medication contains acetaminophen (Tylenol) & its is not recommended that you use additional acetaminophen (Tylenol) while taking this medication. ° °Knee Effusion  °The medical term for having fluid in your knee is effusion.This means something is wrong inside the knee. Some of the causes of fluid in the knee may be torn cartilage, a torn ligament, or bleeding into the joint from an injury. Small tears may heal on their own with conservative treatment. Conservative means rest, limited weight bearing activity and muscle strengthening exercises. Your recovery may take up to 6 weeks. Larger tears may require surgery.  ° °TREATMENT  °Rest, ice, elevation, and compression are the basic modes of treatment.   °Apply ice to the sore area for 15 to 20 minutes, 3 to 4 times per day. Do this while you are awake for the first 2 days, or as directed. This can be stopped when the swelling goes away. Put the ice in a plastic bag and place a towel between the bag of ice and your skin.  °Keep your leg elevated when possible to lessen swelling.  °If your caregiver recommends crutches, use them as instructed for 1 week. Then, you may walk as tolerated.  °Do not drive a vehicle on pain medication. °ACTIVITY: °           - Weight bearing as tolerated °           - Exercises should be limited to pain free range of motion ° °Knee Immobilization:: This is used to support and protect an injured or painful knee. Knee immobilizers keep your knee from being used while it is healing.  °Use powder to control irritation from sweat and friction.  °Adjust the immobilizer to be firm but not tight. Signs of an immobilizer that  is too tight include:  ° Swelling.  ° Numbness.  ° Color change in your foot or ankle.  ° Increased pain.  °While resting, raise your leg above the level of your heart. This reduces throbbing and helps healing. Prop it up with pillows.  °Remove the immobilizer to bathe and sleep. Wear it other times until you see your doctor again.  °             °SEEK MEDICAL CARE IF:  °You have an increase in bruising, swelling, or pain.  °Your toes feel cold.  °Pain relief is not achieved with medications.  °EMERGENCY:: Your toes are numb or blue or you have severe pain.  °You notice redness, swelling, warmth or increasing pain in your knee.  °An unexplained oral temperature above 102° F (38.9° C) develops. ° °COLD THERAPY DIRECTIONS:  °Ice or gel packs can be used to reduce both pain and swelling. Ice is the most helpful within the first 24 to 48 hours after an injury or flareup from overusing a muscle or joint.  Ice is effective, has very few side effects, and is safe for most people to use.  ° °If you expose your skin to cold temperatures for too long or without the proper protection, you can damage your skin or nerves. Watch for signs of skin damage due to cold.  ° °HOME CARE INSTRUCTIONS  °Follow these   tips to use ice and cold packs safely.  °Place a dry or damp towel between the ice and skin. A damp towel will cool the skin more quickly, so you may need to shorten the time that the ice is used.  °For a more rapid response, add gentle compression to the ice.  °Ice for no more than 10 to 20 minutes at a time. The bonier the area you are icing, the less time it will take to get the benefits of ice.  °Check your skin after 5 minutes to make sure there are no signs of a poor response to cold or skin damage.  °Rest 20 minutes or more in between uses.  °Once your skin is numb, you can end your treatment. You can test numbness by very lightly touching your skin. The touch should be so light that you do not see the skin dimple from  the pressure of your fingertip. When using ice, most people will feel these normal sensations in this order: cold, burning, aching, and numbness.  °Do not use ice on someone who cannot communicate their responses to pain, such as small children or people with dementia.  ° °HOW TO MAKE AN ICE PACK  °To make an ice pack, do one of the following:  °Place crushed ice or a bag of frozen vegetables in a sealable plastic bag. Squeeze out the excess air. Place this bag inside another plastic bag. Slide the bag into a pillowcase or place a damp towel between your skin and the bag.  °Mix 3 parts water with 1 part rubbing alcohol. Freeze the mixture in a sealable plastic bag. When you remove the mixture from the freezer, it will be slushy. Squeeze out the excess air. Place this bag inside another plastic bag. Slide the bag into a pillowcase or place a damp towel between your s ° ° ° ° ° °

## 2014-05-02 NOTE — ED Provider Notes (Signed)
Medical screening examination/treatment/procedure(s) were performed by non-physician practitioner and as supervising physician I was immediately available for consultation/collaboration.   EKG Interpretation None        Audree Camel, MD 05/02/14 [phone]

## 2014-08-05 ENCOUNTER — Encounter (HOSPITAL_COMMUNITY): Payer: Self-pay | Admitting: Emergency Medicine

## 2014-08-06 ENCOUNTER — Emergency Department (HOSPITAL_COMMUNITY): Payer: Commercial Managed Care - PPO

## 2014-08-06 ENCOUNTER — Emergency Department (HOSPITAL_COMMUNITY)
Admission: EM | Admit: 2014-08-06 | Discharge: 2014-08-06 | Disposition: A | Discharge status: Home / Self Care | Payer: Commercial Managed Care - PPO | Attending: Emergency Medicine | Admitting: Emergency Medicine

## 2014-08-06 ENCOUNTER — Encounter (HOSPITAL_COMMUNITY): Payer: Self-pay | Admitting: Emergency Medicine

## 2014-08-06 DIAGNOSIS — D7589 Other specified diseases of blood and blood-forming organs: Secondary | ICD-10-CM | POA: Diagnosis not present

## 2014-08-06 DIAGNOSIS — R7989 Other specified abnormal findings of blood chemistry: Secondary | ICD-10-CM

## 2014-08-06 DIAGNOSIS — Z8781 Personal history of (healed) traumatic fracture: Secondary | ICD-10-CM | POA: Diagnosis not present

## 2014-08-06 DIAGNOSIS — R079 Chest pain, unspecified: Secondary | ICD-10-CM | POA: Diagnosis present

## 2014-08-06 DIAGNOSIS — Z720 Tobacco use: Secondary | ICD-10-CM | POA: Diagnosis not present

## 2014-08-06 DIAGNOSIS — E119 Type 2 diabetes mellitus without complications: Secondary | ICD-10-CM | POA: Insufficient documentation

## 2014-08-06 DIAGNOSIS — M940 Chondrocostal junction syndrome [Tietze]: Secondary | ICD-10-CM | POA: Insufficient documentation

## 2014-08-06 DIAGNOSIS — Z8619 Personal history of other infectious and parasitic diseases: Secondary | ICD-10-CM | POA: Diagnosis not present

## 2014-08-06 DIAGNOSIS — Z87442 Personal history of urinary calculi: Secondary | ICD-10-CM | POA: Diagnosis not present

## 2014-08-06 DIAGNOSIS — R0789 Other chest pain: Secondary | ICD-10-CM | POA: Insufficient documentation

## 2014-08-06 LAB — I-STAT TROPONIN, ED: TROPONIN I, POC: 0 ng/mL (ref 0.00–0.08)

## 2014-08-06 LAB — CBC
HCT: 40.4 % (ref 36.0–46.0)
Hemoglobin: 13.3 g/dL (ref 12.0–15.0)
MCH: 27.4 pg (ref 26.0–34.0)
MCHC: 32.9 g/dL (ref 30.0–36.0)
MCV: 83.1 fL (ref 78.0–100.0)
PLATELETS: 413 10*3/uL — AB (ref 150–400)
RBC: 4.86 MIL/uL (ref 3.87–5.11)
RDW: 14.7 % (ref 11.5–15.5)
WBC: 7.8 10*3/uL (ref 4.0–10.5)

## 2014-08-06 LAB — BASIC METABOLIC PANEL
ANION GAP: 14 (ref 5–15)
BUN: 9 mg/dL (ref 6–23)
CO2: 24 mEq/L (ref 19–32)
Calcium: 9.2 mg/dL (ref 8.4–10.5)
Chloride: 101 mEq/L (ref 96–112)
Creatinine, Ser: 0.78 mg/dL (ref 0.50–1.10)
GFR calc Af Amer: >90 mL/min (ref >90)
GLUCOSE: 114 mg/dL — AB (ref 70–99)
Potassium: 3.7 mEq/L (ref 3.7–5.3)
SODIUM: 139 meq/L (ref 137–147)

## 2014-08-06 MED ORDER — KETOROLAC TROMETHAMINE 30 MG/ML IJ SOLN
30.0000 mg | Freq: Once | INTRAMUSCULAR | Status: AC
  Administered 2014-08-06: 30 mg via INTRAMUSCULAR
  Filled 2014-08-06: qty 1

## 2014-08-06 MED ORDER — GI COCKTAIL ~~LOC~~
30.0000 mL | Freq: Once | ORAL | Status: AC
  Administered 2014-08-06: 30 mL via ORAL
  Filled 2014-08-06: qty 30

## 2014-08-06 MED ORDER — BISMUTH SUBSALICYLATE 262 MG/15ML PO SUSP: 30.0000 mL | Freq: Four times a day (QID) | ORAL | Status: DC | PRN

## 2014-08-06 MED ORDER — NAPROXEN 500 MG PO TABS: 500.0000 mg | ORAL_TABLET | Freq: Two times a day (BID) | ORAL | Status: DC | PRN

## 2014-08-06 MED ORDER — HYDROCODONE-ACETAMINOPHEN 5-325 MG PO TABS: 1.0000 | ORAL_TABLET | Freq: Four times a day (QID) | ORAL | Status: DC | PRN

## 2014-08-06 NOTE — Discharge Instructions (Signed)
Your chest pain seems to be rib cage/chest wall pain, likely from a muscle strain. Avoid heavy lifting or movements that aggravate this pain. Use heat for every hour to help with the pain. Use naprosyn or norco as directed as needed for pain, but don't drive while taking norco. Use pepto bismol for indigestion. Follow up with your regular doctor in 1 week for re-check. Return to the ER for changes or worsening symptoms.   Chest Wall Pain Chest wall pain is pain in or around the bones and muscles of your chest. It may take up to 6 weeks to get better. It may take longer if you must stay physically active in your work and activities.  CAUSES  Chest wall pain may happen on its own. However, it may be caused by:  A viral illness like the flu.  Injury.  Coughing.  Exercise.  Arthritis.  Fibromyalgia.  Shingles. HOME CARE INSTRUCTIONS   Avoid overtiring physical activity. Try not to strain or perform activities that cause pain. This includes any activities using your chest or your abdominal and side muscles, especially if heavy weights are used.  Put ice on the sore area.  Put ice in a plastic bag.  Place a towel between your skin and the bag.  Leave the ice on for 15-20 minutes per hour while awake for the first 2 days.  Only take over-the-counter or prescription medicines for pain, discomfort, or fever as directed by your caregiver. SEEK IMMEDIATE MEDICAL CARE IF:   Your pain increases, or you are very uncomfortable.  You have a fever.  Your chest pain becomes worse.  You have new, unexplained symptoms.  You have nausea or vomiting.  You feel sweaty or lightheaded.  You have a cough with phlegm (sputum), or you cough up blood. MAKE SURE YOU:   Understand these instructions.  Will watch your condition.  Will get help right away if you are not doing well or get worse. Document Released: 09/20/2005 Document Revised: 12/13/2011 Document Reviewed:  05/17/2011 [facility] Patient Information 2015 [place], [place]. This information is not intended to replace advice given to you by your health care provider. Make sure you discuss any questions you have with your health care provider.  Costochondritis Costochondritis, sometimes called Tietze syndrome, is a swelling and irritation (inflammation) of the tissue (cartilage) that connects your ribs with your breastbone (sternum). It causes pain in the chest and rib area. Costochondritis usually goes away on its own over time. It can take up to 6 weeks or longer to get better, especially if you are unable to limit your activities. CAUSES  Some cases of costochondritis have no known cause. Possible causes include:  Injury (trauma).  Exercise or activity such as lifting.  Severe coughing. SIGNS AND SYMPTOMS  Pain and tenderness in the chest and rib area.  Pain that gets worse when coughing or taking deep breaths.  Pain that gets worse with specific movements. DIAGNOSIS  Your health care provider will do a physical exam and ask about your symptoms. Chest X-rays or other tests may be done to rule out other problems. TREATMENT  Costochondritis usually goes away on its own over time. Your health care provider may prescribe medicine to help relieve pain. HOME CARE INSTRUCTIONS   Avoid exhausting physical activity. Try not to strain your ribs during normal activity. This would include any activities using chest, abdominal, and side muscles, especially if heavy weights are used.  Apply ice to the affected area for the  first 2 days after the pain begins.  Put ice in a plastic bag.  Place a towel between your skin and the bag.  Leave the ice on for 20 minutes, 2-3 times a day.  Only take over-the-counter or prescription medicines as directed by your health care provider. SEEK MEDICAL CARE IF:  You have redness or swelling at the rib joints. These are signs of infection.  Your pain does not go  away despite rest or medicine. SEEK IMMEDIATE MEDICAL CARE IF:   Your pain increases or you are very uncomfortable.  You have shortness of breath or difficulty breathing.  You cough up blood.  You have worse chest pains, sweating, or vomiting.  You have a fever or persistent symptoms for more than 2-3 days.  You have a fever and your symptoms suddenly get worse. MAKE SURE YOU:   Understand these instructions.  Will watch your condition.  Will get help right away if you are not doing well or get worse. Document Released: 06/30/2005 Document Revised: 07/11/2013 Document Reviewed: 04/24/2013 [facility] Patient Information 2015 [place], [place]. This information is not intended to replace advice given to you by your health care provider. Make sure you discuss any questions you have with your health care provider. Emergency Department Resource Guide 1) Find a Doctor and Pay Out of Pocket Although you won't have to find out who is covered by your insurance plan, it is a good idea to ask around and get recommendations. You will then need to call the office and see if the doctor you have chosen will accept you as a new patient and what types of options they offer for patients who are self-pay. Some doctors offer discounts or will set up payment plans for their patients who do not have insurance, but you will need to ask so you aren't surprised when you get to your appointment.  2) Contact Your Local Health Department Not all health departments have doctors that can see patients for sick visits, but many do, so it is worth a call to see if yours does. If you don't know where your local health department is, you can check in your phone book. The CDC also has a tool to help you locate your state's health department, and many state websites also have listings of all of their local health departments.  3) Find a Walk-in Clinic If your illness is not likely to be very severe or complicated, you may  want to try a walk in clinic. These are popping up all over the country in pharmacies, drugstores, and shopping centers. They're usually staffed by nurse practitioners or physician assistants that have been trained to treat common illnesses and complaints. They're usually fairly quick and inexpensive. However, if you have serious medical issues or chronic medical problems, these are probably not your best option.  No Primary Care Doctor: - Call Health Connect at  [phone] - they can help you locate a primary care doctor that  accepts your insurance, provides certain services, etc. - Physician Referral Service- [phone]  Chronic Pain Problems: Organization         Address  Phone   Notes  [facility]  [phone] Patients need to be referred by their primary care doctor.   Medication Assistance: Organization         Address  Phone   Notes  [facility] Medication [facility] [address] [place], [place] [address] [phone] --Must be a resident  of [place] -- Must have NO insurance coverage whatsoever (no Medicaid/ Medicare, etc.) -- The pt. MUST have a primary care doctor that directs their care regularly and follows them in the community   [facility]ist  [phone]   [employer]  [phone]    Agencies that provide inexpensive medical care: Organization         Address  Phone   Notes  [facility]  [phone]) [phone]   [facility]    [phone]) [phone]   [facility] [address] [place], [place] [id number] [phone]) [phone]   [facility] [address], [place] [phone]   [facility]    [phone]) [phone]   [facility]    [phone]) [phone]   [facility]  [address], [place] Phone:  [phone]) [phone], Fax:  [phone] Hours of Operation:  9 am - 6 pm, M-F.  Also accepts Medicaid/Medicare and self-pay.   [facility] : [facility]  [address], [place] Phone: [phone]) [phone], Fax: [phone](336) [phone]. Hours of Operation:  8:30 am - 5:30 pm, M-F.  Also accepts Medicaid and self-pay.  [facility] [place] [address], [place] Phone: [phone]) [phone]   [facility] [address], [place], [place] [phone], Ext. [phone] Mondays & Thursdays: 7-9 AM.  First 15 patients are seen on a first come, first serve basis.    Medicaid-accepting [facility] Providers:  Organization         Address  Phone   Notes  [facility] [address], [place] [phone] Also accepts self-pay patients.  [facility] [address], [place]  [phone]   [facility] [address], [place] [phone]   [facility] [address] [place] [address], [place] [phone]   Renaye RakersVeita Bland [address], [place]   [phone] Only accepts [place] patients after they have their name applied to their card.   Self-Pay (no insurance) in [facility] [place]:  Organization         Address  Phone   Notes  Sickle Cell Patients, [facility] [address], [place] [phone]   [facility] [address], [place] [phone]) [phone]   [facility] [place]  [address], [place] [phone]   [facility]/Dr. Osei-Bonsu  [address] [place] [address], [place] or [phone], [place] [phone] Phone number for both [place] and [place] locations is the same.  [facility] [address], [place] [phone]) [phone]   [facility] [place] [address] [place] [address], [place] or [address] [phone]) [phone]   [facility] [address], [place] [phone]) [phone], phone; [phone])  [phone], fax Sees patients 1st and 3rd Saturday of every month.  Must not qualify for public or private insurance (i.e. Medicaid, Medicare, [place] Health Choice, Veterans' Benefits)  Household income should be no more than 200% of the poverty level The clinic cannot treat you if you are pregnant or think you are pregnant  Sexually transmitted diseases are not treated at the clinic.

## 2014-08-06 NOTE — ED Notes (Signed)
Pt c/o centralized chest pain that started today. Pt also c/o little SOB but no n/v.
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
## 2015-02-09 ENCOUNTER — Encounter (HOSPITAL_COMMUNITY): Payer: Self-pay | Admitting: Emergency Medicine

## 2015-02-09 ENCOUNTER — Emergency Department (HOSPITAL_COMMUNITY)
Admission: EM | Admit: 2015-02-09 | Discharge: 2015-02-09 | Disposition: A | Discharge status: Home / Self Care | Payer: Commercial Managed Care - PPO | Attending: Emergency Medicine | Admitting: Emergency Medicine

## 2015-02-09 DIAGNOSIS — Z8781 Personal history of (healed) traumatic fracture: Secondary | ICD-10-CM | POA: Diagnosis not present

## 2015-02-09 DIAGNOSIS — Z8619 Personal history of other infectious and parasitic diseases: Secondary | ICD-10-CM | POA: Insufficient documentation

## 2015-02-09 DIAGNOSIS — Z87442 Personal history of urinary calculi: Secondary | ICD-10-CM | POA: Insufficient documentation

## 2015-02-09 DIAGNOSIS — Z720 Tobacco use: Secondary | ICD-10-CM | POA: Insufficient documentation

## 2015-02-09 DIAGNOSIS — R509 Fever, unspecified: Secondary | ICD-10-CM | POA: Diagnosis present

## 2015-02-09 DIAGNOSIS — Z3202 Encounter for pregnancy test, result negative: Secondary | ICD-10-CM | POA: Insufficient documentation

## 2015-02-09 DIAGNOSIS — G44209 Tension-type headache, unspecified, not intractable: Secondary | ICD-10-CM | POA: Insufficient documentation

## 2015-02-09 DIAGNOSIS — Z87440 Personal history of urinary (tract) infections: Secondary | ICD-10-CM | POA: Diagnosis not present

## 2015-02-09 DIAGNOSIS — J069 Acute upper respiratory infection, unspecified: Secondary | ICD-10-CM | POA: Diagnosis not present

## 2015-02-09 DIAGNOSIS — Z8639 Personal history of other endocrine, nutritional and metabolic disease: Secondary | ICD-10-CM | POA: Insufficient documentation

## 2015-02-09 LAB — POC URINE PREG, ED: Preg Test, Ur: NEGATIVE

## 2015-02-09 MED ORDER — ACETAMINOPHEN 325 MG PO TABS
650.0000 mg | ORAL_TABLET | Freq: Once | ORAL | Status: AC
  Administered 2015-02-09: 650 mg via ORAL
  Filled 2015-02-09: qty 2

## 2015-02-09 MED ORDER — NAPROXEN 500 MG PO TABS: 500.0000 mg | ORAL_TABLET | Freq: Two times a day (BID) | ORAL | Status: DC

## 2015-02-09 MED ORDER — ALBUTEROL SULFATE HFA 108 (90 BASE) MCG/ACT IN AERS
2.0000 | INHALATION_SPRAY | RESPIRATORY_TRACT | Status: AC
  Administered 2015-02-09: 2 via RESPIRATORY_TRACT
  Filled 2015-02-09: qty 6.7

## 2015-02-09 MED ORDER — IBUPROFEN 200 MG PO TABS
600.0000 mg | ORAL_TABLET | Freq: Once | ORAL | Status: AC
  Administered 2015-02-09: 600 mg via ORAL
  Filled 2015-02-09: qty 3

## 2015-02-09 MED ORDER — PHENYLEPHRINE-DM-GG-APAP 5-10-200-325 MG PO TABS: 2.0000 | ORAL_TABLET | Freq: Three times a day (TID) | ORAL | Status: DC

## 2015-02-09 NOTE — ED Provider Notes (Signed)
CSN: [id number]     Arrival date & time 02/09/15  0055 History   First MD Initiated Contact with Patient 02/09/15 0250     Chief Complaint  Patient presents with  . Sore Throat  . Headache  . Dizziness  . Fever   (Consider location/radiation/quality/duration/timing/severity/associated sxs/prior Treatment) HPI  Samantha [name] is a 29 year old female presenting with nasal congestion, sore throat and cough. She states the symptoms started 4 days ago. She has also had associated gradual onset, intermittent headache along with some fever and chills. She states she took her temperature at home and was 101, orally. She has not taken anything at home to treat her symptoms because she is scheduled to take a pregnancy test and did not want to take anything if she were pregnant. She denies any abdominal pain, nausea, vomiting, dysuria or vaginal symptoms.  Past Medical History  Diagnosis Date  . PCOS (polycystic ovarian syndrome)   . Chlamydia 12/02/2011  . Headache(784.0)   . Infection     UTI  . Fracture of left leg   . Kidney stone     Kidney stone  . Diabetes mellitus     borderline no meds cbg's blood suger testing normal with pregnancy    Past Surgical History  Procedure Laterality Date  . Tibia fracture surgery    . Cesarean section N/A 05/12/2013    Procedure: Primary cesarean section with delivery of baby boy at 0754.;  Surgeon: Freddrick March. Tenny Craw, MD;  Location: [facility];  Service: Obstetrics;  Laterality: N/A;   Family History  Problem Relation Age of Onset  . Hypertension Other   . Diabetes Other   . Ulcers Mother   . GER disease Mother   . GER disease Sister   . Asthma Brother    History  Substance Use Topics  . Smoking status: Current Every Day Smoker -- 0.25 packs/day for 2 years    Types: Cigarettes  . Smokeless tobacco: Never Used     Comment: quit with preg  . Alcohol Use: No     Comment: socially none since pregnancy   OB History    Gravida Para Term Preterm AB  TAB SAB Ectopic Multiple Living   1 1 1       1      Review of Systems  Constitutional: Positive for chills. Negative for fever.  HENT: Positive for congestion and sore throat.   Eyes: Negative for visual disturbance.  Respiratory: Positive for cough. Negative for shortness of breath.   Cardiovascular: Negative for chest pain and leg swelling.  Gastrointestinal: Negative for nausea, vomiting and diarrhea.  Genitourinary: Negative for dysuria.  Musculoskeletal: Negative for myalgias.  Skin: Negative for rash.  Neurological: Positive for headaches. Negative for weakness and numbness.      Allergies  Review of patient's allergies indicates no known allergies.  Home Medications   Prior to Admission medications   Medication Sig Start Date End Date Taking? Authorizing Provider  bismuth subsalicylate (PEPTO-BISMOL) 262 MG/15ML suspension Take 30 mLs by mouth every 6 (six) hours as needed for indigestion. 08/06/14   Mercedes Camprubi-Soms, PA-C  HYDROcodone-acetaminophen (NORCO) 5-325 MG per tablet Take 1-2 tablets by mouth every 6 (six) hours as needed for severe pain. 08/06/14   Mercedes Camprubi-Soms, PA-C  HYDROcodone-acetaminophen (NORCO/VICODIN) 5-325 MG per tablet Take 2 tablets by mouth every 6 (six) hours as needed. 04/29/14   Heather Laisure, PA-C  naproxen (NAPROSYN) 500 MG tablet Take 1 tablet (500 mg total) by mouth  2 (two) times daily as needed for mild pain, moderate pain or headache (TAKE WITH MEALS.). 08/06/14   Mercedes Camprubi-Soms, PA-C   BP 115/80 mmHg  Pulse 99  Temp(Src) 98.8 F (37.1 C) (Oral)  Resp 16  SpO2 99% Physical Exam  Constitutional: She is oriented to person, place, and time. She appears well-developed and well-nourished. No distress.  HENT:  Head: Normocephalic and atraumatic.  Nose: Rhinorrhea present. Right sinus exhibits no maxillary sinus tenderness and no frontal sinus tenderness. Left sinus exhibits no maxillary sinus tenderness and no frontal  sinus tenderness.  Mouth/Throat: Oropharynx is clear and moist. No oropharyngeal exudate.  Eyes: Conjunctivae are normal. Pupils are equal, round, and reactive to light.  Neck: Neck supple. No thyromegaly present.  Cardiovascular: Normal rate, regular rhythm and intact distal pulses.   Pulmonary/Chest: Effort normal and breath sounds normal. No respiratory distress. She has no wheezes. She has no rales. She exhibits no tenderness.  Abdominal: Soft. There is no tenderness.  Musculoskeletal: She exhibits no tenderness.  Lymphadenopathy:    She has no cervical adenopathy.  Neurological: She is alert and oriented to person, place, and time. She has normal strength. No cranial nerve deficit or sensory deficit. Coordination normal. GCS eye subscore is 4. GCS verbal subscore is 5. GCS motor subscore is 6.  Cranial nerves 2-12 intact.   Skin: Skin is warm and dry. No rash noted. She is not diaphoretic.  Psychiatric: She has a normal mood and affect.  Nursing note and vitals reviewed.   ED Course  Procedures (including critical care time) Labs Review Labs Reviewed - No data to display  Imaging Review No results found.   EKG Interpretation None      MDM   Final diagnoses:  URI (upper respiratory infection)  Tension-type headache, not intractable, unspecified chronicity pattern   29 yo with symptoms consistent with URI, likely viral etiology. No indication for CXR. Discussed that antibiotics are not indicated for viral infections. Pt will be discharged with symptomatic treatment. Pt is well-appearing, in no acute distress and vital signs reviewed and not concerning.  She appears safe to be discharged.  Discharge include follow-up with PCP.  Return precautions provided. Pt aware of plan and in agreement.  Verbalizes understanding and is agreeable with plan.  Filed Vitals:   02/09/15 0104  BP: 115/80  Pulse: 99  Temp: 98.8 F (37.1 C)  TempSrc: Oral  Resp: 16  SpO2: 99%   Meds  given in ED:  Medications  acetaminophen (TYLENOL) tablet 650 mg (650 mg Oral Given 02/09/15 0353)  albuterol (PROVENTIL HFA;VENTOLIN HFA) 108 (90 BASE) MCG/ACT inhaler 2 puff (2 puffs Inhalation Given 02/09/15 0353)  ibuprofen (ADVIL,MOTRIN) tablet 600 mg (600 mg Oral Given 02/09/15 0353)    Discharge Medication List as of 02/09/2015  3:50 AM    START taking these medications   Details  !! naproxen (NAPROSYN) 500 MG tablet Take 1 tablet (500 mg total) by mouth 2 (two) times daily., Starting 02/09/2015, Until Discontinued, Print    Phenylephrine-DM-GG-APAP 5-10-200-325 MG TABS Take 2 tablets by mouth 3 (three) times daily., Starting 02/09/2015, Until Discontinued, Print     !! - Potential duplicate medications found. Please discuss with provider.         Harle Battiest, NP 02/09/15 2229  Mirian Mo, MD 02/12/15 [phone]

## 2015-02-09 NOTE — ED Notes (Signed)
Warm blanket given to pt for comfort measures

## 2015-02-09 NOTE — Discharge Instructions (Signed)
Please follow directions provided. Be sure to follow-up with your primary care doctor to ensure you're getting better. Please use the albuterol inhaler 2 puffs every 4 hours as needed for cough. Please take the naproxen twice a day to help with pain and inflammation. Please take the multisymptom cold medicine 3 times a day to help with other symptoms. Don't hesitate to return for any new, worsening, or concerning symptoms.   SEEK IMMEDIATE MEDICAL CARE IF:  You have a fever.  You develop severe or persistent headache, ear pain, sinus pain, or chest pain.  You develop wheezing, a prolonged cough, cough up blood, or have a change in your usual mucus (if you have chronic lung disease).  You develop sore muscles or a stiff neck.   Emergency Department Resource Guide 1) Find a Doctor and Pay Out of Pocket Although you won't have to find out who is covered by your insurance plan, it is a good idea to ask around and get recommendations. You will then need to call the office and see if the doctor you have chosen will accept you as a new patient and what types of options they offer for patients who are self-pay. Some doctors offer discounts or will set up payment plans for their patients who do not have insurance, but you will need to ask so you aren't surprised when you get to your appointment.  2) Contact Your Local Health Department Not all health departments have doctors that can see patients for sick visits, but many do, so it is worth a call to see if yours does. If you don't know where your local health department is, you can check in your phone book. The CDC also has a tool to help you locate your state's health department, and many state websites also have listings of all of their local health departments.  3) Find a Walk-in Clinic If your illness is not likely to be very severe or complicated, you may want to try a walk in clinic. These are popping up all over the country in pharmacies,  drugstores, and shopping centers. They're usually staffed by nurse practitioners or physician assistants that have been trained to treat common illnesses and complaints. They're usually fairly quick and inexpensive. However, if you have serious medical issues or chronic medical problems, these are probably not your best option.  No Primary Care Doctor: - Call Health Connect at  [phone] - they can help you locate a primary care doctor that  accepts your insurance, provides certain services, etc. - Physician Referral Service- [phone]  Chronic Pain Problems: Organization         Address  Phone   Notes  [facility]  [phone] Patients need to be referred by their primary care doctor.   Medication Assistance: Organization         Address  Phone   Notes  [facility] Medication [facility] [address] [employer] [address] [place], [place] [address] [phone] --Must be a resident of [facility] -- Must have NO insurance coverage whatsoever (no Medicaid/ Medicare, etc.) -- The pt. MUST have a primary care doctor that directs their care regularly and follows them in the community   [facility]  [phone]   [employer]  [phone]    Agencies that provide inexpensive medical care: Organization         Address  Phone   Notes  [facility]  [phone]   [facility]  [facility]    [phone]   [facility] [address] [place], [place] [address] [phone]   [facility] [address] [place] [place] [phone]   Planned Parenthood    [phone]   [facility]    [phone]   Community [facility] and [facility]  [address], [place] Phone:  [phone], Fax:  [phone] Hours of Operation:  9 am - 6 pm, M-F.  Also accepts Medicaid/Medicare and self-pay.  [facility]  [address], [place] Phone:  [phone], Fax: [phone]. Hours of Operation:  8:30 am - 5:30 pm, M-F.  Also accepts Medicaid and self-pay.  [facility] [place] [address], [place] Phone: [phone]   [facility] [address] Natasha Bence [place], [place] [phone], Ext. [phone] Mondays & Thursdays: 7-9 AM.  First 15 patients are seen on a first come, first serve basis.    Medicaid-accepting [facility] Providers:  Organization         Address  Phone   Notes  [facility] [address], [place] [phone] Also accepts self-pay patients.  [facility] [address] Laurell Josephs [place], [place]  [phone]   [facility] [address], [place] [phone]   [facility] [address] [place] [phone]   Renaye Rakers [address], [place]   [phone] Only accepts [place] Access [place] patients after they have their name applied to their card.   Self-Pay (no insurance) in [facility]:  Organization         Address  Phone   Notes  Sickle Cell Patients, [facility] Internal Medicine [address] [place], [place] [phone]   [facility] [address] [place], [place] [phone]   [facility] [place]  [address], [place] [phone]   [facility]/Dr. Osei-Bonsu  [address], [place] or [address], [place] [phone] Phone number for both [place] and [place] locations is the same.  Urgent Medical and [facility] [address] [place] [phone]   [facility] [address] [place] or [address] [phone]   [facility] [address], [place] [phone], phone; [phone], fax Sees patients 1st and 3rd Saturday of every month.  Must not qualify for public  or private insurance (i.e. Medicaid, Medicare, [place] Health Choice, Veterans' Benefits)  Household income should be no more than 200% of the poverty level The clinic cannot treat you if you are pregnant or think you are pregnant  Sexually transmitted diseases are not treated at the clinic.    Dental Care: Organization         Address  Phone  Notes  [facility] Department of [facility] [address] [place], [place] [phone] Accepts children up to age 50 who are enrolled in [place] or [place] Health Choice; pregnant women with a Medicaid card; and children who have applied for Medicaid or  Health Choice, but were declined, whose parents can pay a reduced fee at time of service.  [facility] Department of [facility]  [address] [place] [address], [employer] (  [phone] Accepts children up to age 45 who are enrolled in Medicaid or [place] Health Choice; pregnant women with a Medicaid card; and children who have applied for Medicaid or [place] Health Choice, but were declined, whose parents can pay a reduced fee at time of service.  [facility]  [address] [place], [place] [phone] Patients are seen by appointment only. Walk-ins are not accepted. [facility] will see patients 14 years of age and older. Monday - Tuesday (8am-5pm) Most Wednesdays (8:30-5pm) $30 per visit, cash only  [facility] Adult Dental Access PRO[facility]  [address] [place] [address], [facility] [phone] Patients are seen by appointment only. Walk-ins are not accepted. [facility] will see patients 101 years of age and older. One Wednesday Evening (Monthly: Volunteer Based).  $30 per visit, cash only  [employer]  [phone] for adults; Children under age 18, call Graduate Pediatric Dentistry at [phone]. Children aged 69-14, please call [phone] to request a pediatric application.  Dental services are provided in all areas of  dental care including fillings, crowns and bridges, complete and partial dentures, implants, gum treatment, root canals, and extractions. Preventive care is also provided. Treatment is provided to both adults and children. Patients are selected via a lottery and there is often a waiting list.   [facility] [address] [employer]  [phone] [email or web address]   [facility] [address] [place], [place] [phone], Ext. [phone] Second and Fourth Thursday of each month, opens at 6:30 AM; Clinic ends at 9 AM.  Patients are seen on a first-come first-served basis, and a limited number are seen during each clinic.   [facility]  [address] Ether Griffins [place], [place] [phone]   Eligibility Requirements You must have lived in [place], [place], or [place] counties for at least the last three months.   You cannot be eligible for state or federal sponsored [place], including [employer], [place], or [employer].   You generally cannot be eligible for healthcare insurance through your employer.    How to apply: Eligibility screenings are held every Tuesday and Wednesday afternoon from 1:00 pm until 4:00 pm. You do not need an appointment for the interview!  [facility] [address] [place], [place] [phone]   [facility] Health Department  [phone]   [facility] Health Department  [phone]   [facility] Health Department  [phone]    Behavioral Health Resources in the Community: Intensive Outpatient Programs Organization         Address  Phone  Notes  [facility] Services [address] [place], [place] [phone]   [facility] Outpatient [address] [place], [place] [phone]   [facility] [address] [place], [place]  [phone]   [facility] [address],  [place], [place] [phone] or  [phone]   Substance Abuse Resources Organization         Address  Phone  Notes  [facility] Drug Services  [phone]   [facility]  [phone]   [facility] [place]  [phone]   Floydene Flock  [phone]   Residential & Outpatient Substance Abuse Program  [phone]   Psychological Services Organization         Address  Phone  Notes  [facility]  [phone]   [facility]  [phone]  [place] [address] [place] or [phone]    Mobile Crisis Teams Organization         Address  Phone  Notes  [facility], Mobile Crisis Care Unit  [phone]   [facility]  [address] [place], [place]   [place] Levels [address], [place] [phone]    Self-Help/Support Groups Organization         Address  Phone             Notes  [place]. of [place] - variety of support groups  [place] Call for more information  Narcotics Anonymous (NA), [facility] [address], [employer]  2 meetings at this location   Special educational needs teacher         Address  Phone  Notes  [facility] [place],    [place]  [phone]   [facility]  [address] [place] [address]92, [place], [place]   [place], [place] [phone] Admissions: 8am-3pm M-F  Incentives Substance [facility] [address]    [place], [place] [phone]   [facility] [address] [place], [place], [place]   [facility] [address],  [place], [place]   [facility] - Intensive Outpatient [address] Kristeen Mans [address], [place], [place]   [facility].,  [place], [place] [phone] or [phone]   Residential  Treatment Services (RTS) [address], [place], [place] Accepts Medicaid  [facility] [address]  [place] [phone] Substance Abuse/Addiction Treatment   [facility] Organization         Address  Phone  Notes  [facility]  [phone]   Domenic Schwab, PhD [address] Arlis Porta [place], [place]   [phone] or [phone]   [place], [place] [phone]   [facility] [phone] [place], [place] [phone] Insurance/Medicaid/sponsorship through [facility] [place] [place] and Families [address] [place]                                    [place], [place] [phone] [place] [address], [place] [phone]    Dr. Adele Schilder  [phone]   [facility] Dept. 1) [address], [place] 2) [place] 3)  [place] [address], [place] [phone]  [phone]   [place] [phone] or [phone] (After Hours)

## 2015-02-09 NOTE — ED Notes (Addendum)
Pt from home c/o cough, sore throat, headache, and fever x 3 days. She also reports dizziness. Denies weakness or blurred vision. She reports taking advil at 2300 that helped some with her headache. Pt would like [place] to be aware the she is going to a fertility clinic and is suppose to have a pregnancy test on Monday.

## 2015-02-09 NOTE — ED Notes (Signed)
POC placed in mini lab,  [facility] aware and will run test

## 2015-04-08 ENCOUNTER — Emergency Department (HOSPITAL_COMMUNITY)
Admission: EM | Admit: 2015-04-08 | Discharge: 2015-04-08 | Disposition: A | Discharge status: Home / Self Care | Payer: Commercial Managed Care - PPO | Attending: Emergency Medicine | Admitting: Emergency Medicine

## 2015-04-08 ENCOUNTER — Encounter (HOSPITAL_COMMUNITY): Payer: Self-pay | Admitting: Emergency Medicine

## 2015-04-08 DIAGNOSIS — X58XXXA Exposure to other specified factors, initial encounter: Secondary | ICD-10-CM | POA: Diagnosis not present

## 2015-04-08 DIAGNOSIS — Y929 Unspecified place or not applicable: Secondary | ICD-10-CM | POA: Diagnosis not present

## 2015-04-08 DIAGNOSIS — Z8742 Personal history of other diseases of the female genital tract: Secondary | ICD-10-CM | POA: Diagnosis not present

## 2015-04-08 DIAGNOSIS — Z720 Tobacco use: Secondary | ICD-10-CM | POA: Diagnosis not present

## 2015-04-08 DIAGNOSIS — H579 Unspecified disorder of eye and adnexa: Secondary | ICD-10-CM

## 2015-04-08 DIAGNOSIS — Z8669 Personal history of other diseases of the nervous system and sense organs: Secondary | ICD-10-CM | POA: Diagnosis not present

## 2015-04-08 DIAGNOSIS — E119 Type 2 diabetes mellitus without complications: Secondary | ICD-10-CM | POA: Diagnosis not present

## 2015-04-08 DIAGNOSIS — Y939 Activity, unspecified: Secondary | ICD-10-CM | POA: Diagnosis not present

## 2015-04-08 DIAGNOSIS — Z8619 Personal history of other infectious and parasitic diseases: Secondary | ICD-10-CM | POA: Insufficient documentation

## 2015-04-08 DIAGNOSIS — Z8781 Personal history of (healed) traumatic fracture: Secondary | ICD-10-CM | POA: Insufficient documentation

## 2015-04-08 DIAGNOSIS — Y999 Unspecified external cause status: Secondary | ICD-10-CM | POA: Insufficient documentation

## 2015-04-08 DIAGNOSIS — S0591XA Unspecified injury of right eye and orbit, initial encounter: Secondary | ICD-10-CM | POA: Diagnosis present

## 2015-04-08 DIAGNOSIS — S0501XA Injury of conjunctiva and corneal abrasion without foreign body, right eye, initial encounter: Secondary | ICD-10-CM | POA: Insufficient documentation

## 2015-04-08 DIAGNOSIS — Z87442 Personal history of urinary calculi: Secondary | ICD-10-CM | POA: Insufficient documentation

## 2015-04-08 MED ORDER — KETOROLAC TROMETHAMINE 0.5 % OP SOLN: 1.0000 [drp] | Freq: Four times a day (QID) | OPHTHALMIC | Status: DC

## 2015-04-08 MED ORDER — TETRACAINE HCL 0.5 % OP SOLN
1.0000 [drp] | Freq: Once | OPHTHALMIC | Status: AC
  Administered 2015-04-08: 1 [drp] via OPHTHALMIC
  Filled 2015-04-08: qty 2

## 2015-04-08 MED ORDER — FLUORESCEIN SODIUM 1 MG OP STRP
1.0000 | ORAL_STRIP | Freq: Once | OPHTHALMIC | Status: AC
  Administered 2015-04-08: 1 via OPHTHALMIC
  Filled 2015-04-08: qty 1

## 2015-04-08 MED ORDER — TOBRAMYCIN 0.3 % OP SOLN: 1.0000 [drp] | OPHTHALMIC | Status: DC

## 2015-04-08 NOTE — ED Provider Notes (Signed)
CSN: [id number]     Arrival date & time 04/08/15  1901 History  This chart was scribed for Antony Madura, PA-C, working with Arby Barrette, MD by Octavia Heir, ED Scribe. This patient was seen in room WTR8/WTR8 and the patient's care was started at 9:02 PM.    No chief complaint on file.   The history is provided by the patient. No language interpreter was used.   HPI Comments: Samantha [name] is a 29 y.o. female who presents to the Emergency Department complaining of constant, gradual worsening right eye pain onset about 7 hours ago. Pt has associated eye drainage, minimal light sensitivity. She reports that the pain is aching in her right eye. Pt states that a foreign body flew into her eye earlier today. She reports trying multiple methods such as Visine, flushing her eye out with water, and eye drops to help with the pain and to try and take the foreign body out. Pt is unknown if she has been around any person with pink eye but notes she has had pink eye before but this is not the same.  Past Medical History  Diagnosis Date  . PCOS (polycystic ovarian syndrome)   . Chlamydia 12/02/2011  . Headache(784.0)   . Infection     UTI  . Fracture of left leg   . Kidney stone     Kidney stone  . Diabetes mellitus     borderline no meds cbg's blood suger testing normal with pregnancy    Past Surgical History  Procedure Laterality Date  . Tibia fracture surgery    . Cesarean section N/A 05/12/2013    Procedure: Primary cesarean section with delivery of baby boy at 0754.;  Surgeon: Freddrick March. Tenny Craw, MD;  Location: [facility];  Service: Obstetrics;  Laterality: N/A;   Family History  Problem Relation Age of Onset  . Hypertension Other   . Diabetes Other   . Ulcers Mother   . GER disease Mother   . GER disease Sister   . Asthma Brother    History  Substance Use Topics  . Smoking status: Current Every Day Smoker -- 0.25 packs/day for 2 years    Types: Cigarettes  . Smokeless tobacco: Never  Used     Comment: quit with preg  . Alcohol Use: No     Comment: socially none since pregnancy   OB History    Gravida Para Term Preterm AB TAB SAB Ectopic Multiple Living   1 1 1       1       Review of Systems  Eyes: Positive for pain and discharge.  All other systems reviewed and are negative.   Allergies  Review of patient's allergies indicates no known allergies.  Home Medications   Prior to Admission medications   Medication Sig Start Date End Date Taking? Authorizing Provider  Chlorpheniramine-Phenylephrine (SINUS & ALLERGY) 4-10 MG per tablet Take 2 tablets by mouth every 4 (four) hours as needed for congestion.   Yes Historical Provider, MD  ibuprofen (ADVIL,MOTRIN) 200 MG tablet Take 600 mg by mouth every 6 (six) hours as needed for moderate pain.    Yes Historical Provider, MD  bismuth subsalicylate (PEPTO-BISMOL) 262 MG/15ML suspension Take 30 mLs by mouth every 6 (six) hours as needed for indigestion. Patient not taking: Reported on 02/09/2015 08/06/14   Mercedes Camprubi-Soms, PA-C  HYDROcodone-acetaminophen (NORCO) 5-325 MG per tablet Take 1-2 tablets by mouth every 6 (six) hours as needed for severe pain. Patient not  taking: Reported on 02/09/2015 08/06/14   Mercedes Camprubi-Soms, PA-C  HYDROcodone-acetaminophen (NORCO/VICODIN) 5-325 MG per tablet Take 2 tablets by mouth every 6 (six) hours as needed. Patient not taking: Reported on 02/09/2015 04/29/14   Santiago Glad, PA-C  ketorolac (ACULAR) 0.5 % ophthalmic solution Place 1 drop into the right eye every 6 (six) hours. 04/08/15   Antony Madura, PA-C  naproxen (NAPROSYN) 500 MG tablet Take 1 tablet (500 mg total) by mouth 2 (two) times daily as needed for mild pain, moderate pain or headache (TAKE WITH MEALS.). Patient not taking: Reported on 02/09/2015 08/06/14   Mercedes Camprubi-Soms, PA-C  naproxen (NAPROSYN) 500 MG tablet Take 1 tablet (500 mg total) by mouth 2 (two) times daily. Patient not taking: Reported on 04/08/2015  02/09/15   Harle Battiest, NP  Phenylephrine-DM-GG-APAP 5-10-200-325 MG TABS Take 2 tablets by mouth 3 (three) times daily. Patient not taking: Reported on 04/08/2015 02/09/15   Harle Battiest, NP  tobramycin (TOBREX) 0.3 % ophthalmic solution Place 1 drop into the right eye every 4 (four) hours. Place one drop in the affected eye every 4 hours for 7 days 04/08/15   Antony Madura, PA-C   Triage vitals: BP 126/81 mmHg  Pulse 88  Temp(Src) 98.2 F (36.8 C) (Oral)  Ht 5\' 5"  (1.651 m)  Wt 258 lb (117.028 kg)  BMI 42.93 kg/m2  SpO2 100%  Physical Exam  Constitutional: She is oriented to person, place, and time. She appears well-developed and well-nourished. No distress.  HENT:  Head: Normocephalic and atraumatic.  Eyes: EOM are normal. Pupils are equal, round, and reactive to light. Lids are everted and swept, no foreign bodies found. Right eye exhibits discharge (pale yellow discharge). No foreign body present in the right eye. Right conjunctiva is injected. No scleral icterus.  Slit lamp exam:      The right eye shows corneal abrasion. The right eye shows no corneal flare, no corneal ulcer, no foreign body and no anterior chamber bulge.    Snellen 20/30 OS, OD, OU No proptosis or hyphema. No subconjunctival hemorrhage. No dendritic staining. Negative Sidel's sign. No FB visualized on exam. There appears to be a small area of fluorescein uptake c/w a small corneal abrasion to the medial R eye. Normal EOMs. No consensual photophobia.  Neck: Normal range of motion.  Pulmonary/Chest: Effort normal. No respiratory distress.  Musculoskeletal: Normal range of motion.  Neurological: She is alert and oriented to person, place, and time.  Skin: Skin is warm and dry. No rash noted. She is not diaphoretic. No erythema. No pallor.  Psychiatric: She has a normal mood and affect. Her behavior is normal.  Nursing note and vitals reviewed.   ED Course  Procedures  DIAGNOSTIC STUDIES: Oxygen Saturation  is 100% on RA, normal by my interpretation.  COORDINATION OF CARE:  9:06 PM Discussed treatment plan which includes stain eye and look at eye with pt at bedside and pt agreed to plan.  Labs Review Labs Reviewed - No data to display  Imaging Review No results found.   EKG Interpretation None      MDM   Final diagnoses:  Sensation of foreign body in eye  Corneal abrasion, right, initial encounter    29 year old female presents to the emergency department for further evaluation of right eye irritation. Patient has no consensual photophobia. No proptosis or hyphema. EOMs intact without pain with eye movement. Visual acuity intact on exam. There appears to be a small corneal abrasion to the medial right  eye. This is likely the cause of patient's foreign body sensation. No residual foreign body appreciated on funduscopic exam or slit lamp exam. Purulent discharge from the right eye is also suspicious for infection. Patient to be placed on tobramycin drops to cover for this and Acular for pain as needed. Advised the patient follow-up with an ophthalmologist for further evaluation of her symptoms, especially should her symptoms persist or worsen. Return precautions discussed and provided. Patient agreeable to plan with no unaddressed concerns. Patient discharged in good condition.  I personally performed the services described in this documentation, which was scribed in my presence. The recorded information has been reviewed and is accurate.   Filed Vitals:   04/08/15 1911 04/08/15 2255  BP: 126/81 124/87  Pulse: 88 75  Temp: 98.2 F (36.8 C)   TempSrc: Oral   Resp:  18  Height:  (1.651 m)   Weight: 258 lb (117.028 kg)   SpO2: 100% 100%     Antony Madura, PA-C 04/09/15 1943  Arby Barrette, MD 04/10/15 2342

## 2015-04-08 NOTE — Discharge Instructions (Signed)
Corneal Abrasion °The cornea is the clear covering at the front and center of the eye. When looking at the colored portion of the eye (iris), you are looking through the cornea. This very thin tissue is made up of many layers. The surface layer is a single layer of cells (corneal epithelium) and is one of the most sensitive tissues in the body. If a scratch or injury causes the corneal epithelium to come off, it is called a corneal abrasion. If the injury extends to the tissues below the epithelium, the condition is called a corneal ulcer. °CAUSES  °· Scratches. °· Trauma. °· Foreign body in the eye. °Some people have recurrences of abrasions in the area of the original injury even after it has healed (recurrent erosion syndrome). Recurrent erosion syndrome generally improves and goes away with time. °SYMPTOMS  °· Eye pain. °· Difficulty or inability to keep the injured eye open. °· The eye becomes very sensitive to light. °· Recurrent erosions tend to happen suddenly, first thing in the morning, usually after waking up and opening the eye. °DIAGNOSIS  °Your health care provider can diagnose a corneal abrasion during an eye exam. Dye is usually placed in the eye using a drop or a small paper strip moistened by your tears. When the eye is examined with a special light, the abrasion shows up clearly because of the dye. °TREATMENT  °· Small abrasions may be treated with antibiotic drops or ointment alone. °· A pressure patch may be put over the eye. If this is done, follow your doctor's instructions for when to remove the patch. Do not drive or use machines while the eye patch is on. Judging distances is hard to do with a patch on. °If the abrasion becomes infected and spreads to the deeper tissues of the cornea, a corneal ulcer can result. This is serious because it can cause corneal scarring. Corneal scars interfere with light passing through the cornea and cause a loss of vision in the involved eye. °HOME CARE  INSTRUCTIONS °· Use medicine or ointment as directed. Only take over-the-counter or prescription medicines for pain, discomfort, or fever as directed by your health care provider. °· Do not drive or operate machinery if your eye is patched. Your ability to judge distances is impaired. °· If your health care provider has given you a follow-up appointment, it is very important to keep that appointment. Not keeping the appointment could result in a severe eye infection or permanent loss of vision. If there is any problem keeping the appointment, let your health care provider know. °SEEK MEDICAL CARE IF:  °· You have pain, light sensitivity, and a scratchy feeling in one eye or both eyes. °· Your pressure patch keeps loosening up, and you can blink your eye under the patch after treatment. °· Any kind of discharge develops from the eye after treatment or if the lids stick together in the morning. °· You have the same symptoms in the morning as you did with the original abrasion days, weeks, or months after the abrasion healed. °MAKE SURE YOU:  °· Understand these instructions. °· Will watch your condition. °· Will get help right away if you are not doing well or get worse. °Document Released: 09/17/2000 Document Revised: 09/25/2013 Document Reviewed: 05/28/2013 °Exit[employer]® Patient Information ©2015 [employer]. This information is not intended to replace advice given to you by your health care provider. Make sure you discuss any questions you have with your health care provider. ° °

## 2015-04-08 NOTE — ED Notes (Signed)
Pt c/o right eye pain onset today, states that foreign object is in eye, states she has been attempting to remove foreign object all day with her fingers and with eye irrigation. Right eye is edematous and sclera is pink. Pt c/o pain when moving eye. Pt denies increase in pain in right eye when light is shown in left eye causing bilateral pupil constriction.

## 2015-08-15 ENCOUNTER — Emergency Department (HOSPITAL_COMMUNITY)
Admission: EM | Admit: 2015-08-15 | Discharge: 2015-08-15 | Disposition: A | Discharge status: Home / Self Care | Payer: Commercial Managed Care - PPO | Source: Home / Self Care

## 2015-08-15 ENCOUNTER — Encounter (HOSPITAL_COMMUNITY): Payer: Self-pay

## 2015-08-15 DIAGNOSIS — R202 Paresthesia of skin: Secondary | ICD-10-CM | POA: Diagnosis not present

## 2015-08-15 LAB — GLUCOSE, CAPILLARY: Glucose-Capillary: 84 mg/dL (ref 65–99)

## 2015-08-15 NOTE — Discharge Instructions (Signed)
Take a baby aspirin daily Warm socks Activity as tolerated Keep feet elevated  If not any better call urgent care center on Monday to speak with me and we will sort out some follow-up for you.

## 2015-08-15 NOTE — ED Notes (Signed)
C/o feet numb x couple of days duration

## 2015-08-20 ENCOUNTER — Emergency Department (HOSPITAL_COMMUNITY)
Admission: EM | Admit: 2015-08-20 | Discharge: 2015-08-20 | Disposition: A | Discharge status: Home / Self Care | Payer: Commercial Managed Care - PPO | Source: Home / Self Care | Attending: Emergency Medicine | Admitting: Emergency Medicine

## 2015-08-20 ENCOUNTER — Encounter (HOSPITAL_COMMUNITY): Payer: Self-pay | Admitting: Emergency Medicine

## 2015-08-20 DIAGNOSIS — R202 Paresthesia of skin: Secondary | ICD-10-CM | POA: Diagnosis not present

## 2015-08-20 LAB — FOLATE: Folate: 6.5 ng/mL (ref >5.9)

## 2015-08-20 LAB — VITAMIN B12: Vitamin B-12: 244 pg/mL (ref 180–914)

## 2015-08-20 MED ORDER — GABAPENTIN 300 MG PO CAPS: 300.0000 mg | ORAL_CAPSULE | Freq: Every day | ORAL | Status: DC

## 2015-08-20 MED ORDER — TRAMADOL HCL 50 MG PO TABS: 50.0000 mg | ORAL_TABLET | Freq: Four times a day (QID) | ORAL | Status: DC | PRN

## 2015-08-20 NOTE — ED Notes (Signed)
The patient presented to the [facility] with a complaint of pain and numbness to her feet. The patient stated that she was at the [facility] with the same complaint 5 days ago and was told to take aspirin but that did not help.

## 2015-08-20 NOTE — ED Provider Notes (Signed)
CSN: [id number]     Arrival date & time 08/20/15  1604 History   First MD Initiated Contact with Patient 08/20/15 1652     Chief Complaint  Patient presents with  . Foot Pain  . Numbness   (Consider location/radiation/quality/duration/timing/severity/associated sxs/prior Treatment) HPI She is a 29 year old woman here for follow-up of foot pain. She states about a week ago she started having numbness in her bilateral toes. Initially, there was no pain, but over the last 2 days she has developed a burning and tight feeling in her toes. This involves all of the toes on the right side but only the big toe on the left. She was seen at the urgent care and told to take aspirin for a few days to see if that improved it. She states that aspirin did not help. The discomfort is constant. It is worse with wearing shoes, but not socks. She states it is interfering with her sleep.  She states she has a history of diabetes a number of years ago. She has not been on any medication in quite some time. She had a CBG checked when she was here previously and it was normal. She is a smoker, she states she smokes 4 cigarettes a day. Denies alcohol use.  Past Medical History  Diagnosis Date  . PCOS (polycystic ovarian syndrome)   . Chlamydia 12/02/2011  . Headache(784.0)   . Infection     UTI  . Fracture of left leg   . Kidney stone     Kidney stone  . Diabetes mellitus     borderline no meds cbg's blood suger testing normal with pregnancy    Past Surgical History  Procedure Laterality Date  . Tibia fracture surgery    . Cesarean section N/A 05/12/2013    Procedure: Primary cesarean section with delivery of baby boy at 0754.;  Surgeon: Freddrick March. Tenny Craw, MD;  Location: [facility];  Service: Obstetrics;  Laterality: N/A;   Family History  Problem Relation Age of Onset  . Hypertension Other   . Diabetes Other   . Ulcers Mother   . GER disease Mother   . GER disease Sister   . Asthma Brother    Social History   Substance Use Topics  . Smoking status: Current Every Day Smoker -- 0.25 packs/day for 2 years    Types: Cigarettes  . Smokeless tobacco: Never Used     Comment: quit with preg  . Alcohol Use: No     Comment: socially none since pregnancy   OB History    Gravida Para Term Preterm AB TAB SAB Ectopic Multiple Living   Review of Systems As in history of present illness Allergies  Review of patient's allergies indicates no known allergies.  Home Medications   Prior to Admission medications   Medication Sig Start Date End Date Taking? Authorizing Provider  Chlorpheniramine-Phenylephrine (SINUS & ALLERGY) 4-10 MG per tablet Take 2 tablets by mouth every 4 (four) hours as needed for congestion.    Historical Provider, MD  gabapentin (NEURONTIN) 300 MG capsule Take 1 capsule (300 mg total) by mouth at bedtime. 08/20/15   Charm Rings, MD  ibuprofen (ADVIL,MOTRIN) 200 MG tablet Take 600 mg by mouth every 6 (six) hours as needed for moderate pain.     Historical Provider, MD  ketorolac (ACULAR) 0.5 % ophthalmic solution Place 1 drop into the right eye every 6 (six)  hours. 04/08/15   Antony Madura, PA-C  tobramycin (TOBREX) 0.3 % ophthalmic solution Place 1 drop into the right eye every 4 (four) hours. Place one drop in the affected eye every 4 hours for 7 days 04/08/15   Antony Madura, PA-C  traMADol (ULTRAM) 50 MG tablet Take 1 tablet (50 mg total) by mouth every 6 (six) hours as needed. 08/20/15   Charm Rings, MD   Meds Ordered and Administered this Visit  Medications - No data to display  BP 133/89 mmHg  Pulse 83  Temp(Src) 98.2 F (36.8 C) (Oral)  Resp 18  SpO2 99% No data found.   Physical Exam  Constitutional: She is oriented to person, place, and time. She appears well-developed and well-nourished. No distress.  Cardiovascular: Normal rate.   Pulmonary/Chest: Effort normal.  Musculoskeletal:  Feet: 2+ DP pulses bilaterally. Brisk cap refill in all digits. No  tenderness to palpation. Sensation grossly intact to all toes. No swelling.  Neurological: She is alert and oriented to person, place, and time.    ED Course  Procedures (including critical care time)  Labs Review Labs Reviewed  VITAMIN B12  FOLATE    Imaging Review No results found.    MDM   1. Paresthesia of both feet    Unclear etiology. Will check a B-12 and folate level. Given her discomfort, will start her on a trial of gabapentin. Prescription for tramadol also provided. If this is not improving in the next week, she will follow-up with [facility].    Charm Rings, MD 08/20/15 [phone]

## 2015-08-20 NOTE — Discharge Instructions (Signed)
I'm not sure what is causing your pain. This is called a paresthesia. We did some blood work today to check your folate and B-12 level. We will call you if it is abnormal. Take gabapentin daily at bedtime. This medicine will make you drowsy. Use the tramadol every 6-8 hours as needed for severe pain. Do not drive while taking this medicine. If things are not improving in 1 week, please follow-up at the [facility].

## 2015-08-21 NOTE — ED Notes (Signed)
Lab values WNL

## 2015-08-31 ENCOUNTER — Encounter (HOSPITAL_COMMUNITY): Payer: Self-pay | Admitting: Emergency Medicine

## 2015-08-31 ENCOUNTER — Emergency Department (INDEPENDENT_AMBULATORY_CARE_PROVIDER_SITE_OTHER)
Admission: EM | Admit: 2015-08-31 | Discharge: 2015-08-31 | Disposition: A | Discharge status: Home / Self Care | Payer: Commercial Managed Care - PPO | Source: Home / Self Care | Attending: Emergency Medicine | Admitting: Emergency Medicine

## 2015-08-31 DIAGNOSIS — R202 Paresthesia of skin: Secondary | ICD-10-CM

## 2015-08-31 MED ORDER — TRAMADOL HCL 50 MG PO TABS: 50.0000 mg | ORAL_TABLET | Freq: Four times a day (QID) | ORAL | Status: DC | PRN

## 2015-08-31 NOTE — ED Notes (Signed)
C/o persistent bilateral foot/toe pain/numbness onset 11/7 Has been here multiple times; given Gabapentin and tramadol w/no relief A&O x4... No acute distress.

## 2015-08-31 NOTE — Discharge Instructions (Signed)
I have put in a referral to neurology. Please call [facility] tomorrow to schedule the appointment.

## 2015-08-31 NOTE — ED Provider Notes (Signed)
CSN: [id number]     Arrival date & time 08/31/15  1639 History   First MD Initiated Contact with Patient 08/31/15 1657     Chief Complaint  Patient presents with  . Foot Pain   (Consider location/radiation/quality/duration/timing/severity/associated sxs/prior Treatment) HPI She is a 29 year old woman here for follow-up of bilateral foot pain. She was seen here about 10 days ago for bilateral numbness and pain to her toes. She states she has been taking the gabapentin without improvement. It does help her sleep. She states the tramadol does help for severe pain. She has had a B-12, folate, and CBG which are normal.  Past Medical History  Diagnosis Date  . PCOS (polycystic ovarian syndrome)   . Chlamydia 12/02/2011  . Headache(784.0)   . Infection     UTI  . Fracture of left leg   . Kidney stone     Kidney stone  . Diabetes mellitus     borderline no meds cbg's blood suger testing normal with pregnancy    Past Surgical History  Procedure Laterality Date  . Tibia fracture surgery    . Cesarean section N/A 05/12/2013    Procedure: Primary cesarean section with delivery of baby boy at 0754.;  Surgeon: Freddrick March. Tenny Craw, MD;  Location: [facility];  Service: Obstetrics;  Laterality: N/A;   Family History  Problem Relation Age of Onset  . Hypertension Other   . Diabetes Other   . Ulcers Mother   . GER disease Mother   . GER disease Sister   . Asthma Brother    Social History  Substance Use Topics  . Smoking status: Current Every Day Smoker -- 0.25 packs/day for 2 years    Types: Cigarettes  . Smokeless tobacco: Never Used     Comment: quit with preg  . Alcohol Use: No     Comment: socially none since pregnancy   OB History    Gravida Para Term Preterm AB TAB SAB Ectopic Multiple Living   1 1 1       1      Review of Systems As in history of present illness Allergies  Review of patient's allergies indicates no known allergies.  Home Medications   Prior to Admission  medications   Medication Sig Start Date End Date Taking? Authorizing Provider  gabapentin (NEURONTIN) 300 MG capsule Take 1 capsule (300 mg total) by mouth at bedtime. 08/20/15  Yes Charm Rings, MD  Chlorpheniramine-Phenylephrine (SINUS & ALLERGY) 4-10 MG per tablet Take 2 tablets by mouth every 4 (four) hours as needed for congestion.    Historical Provider, MD  ibuprofen (ADVIL,MOTRIN) 200 MG tablet Take 600 mg by mouth every 6 (six) hours as needed for moderate pain.     Historical Provider, MD  ketorolac (ACULAR) 0.5 % ophthalmic solution Place 1 drop into the right eye every 6 (six) hours. 04/08/15   Antony Madura, PA-C  tobramycin (TOBREX) 0.3 % ophthalmic solution Place 1 drop into the right eye every 4 (four) hours. Place one drop in the affected eye every 4 hours for 7 days 04/08/15   Antony Madura, PA-C  traMADol (ULTRAM) 50 MG tablet Take 1 tablet (50 mg total) by mouth every 6 (six) hours as needed. 08/31/15   Charm Rings, MD   Meds Ordered and Administered this Visit  Medications - No data to display  BP 142/96 mmHg  Pulse 76  Temp(Src) 98.9 F (37.2 C) (Oral)  Resp 16  SpO2 100% No data found.  Physical Exam  Constitutional: She is oriented to person, place, and time. She appears well-developed and well-nourished. No distress.  Cardiovascular: Normal rate.   Pulmonary/Chest: Effort normal.  Neurological: She is alert and oriented to person, place, and time.    ED Course  Procedures (including critical care time)  Labs Review Labs Reviewed - No data to display  Imaging Review No results found.   MDM   1. Paresthesia    I've provided a refill of the tramadol to use as needed for pain. Referral to neurology placed.    Daimian Sudberry J HoCharm Rings1/27/16 [phone]

## 2015-09-01 ENCOUNTER — Ambulatory Visit: Payer: Commercial Managed Care - PPO | Admitting: Neurology

## 2015-09-05 ENCOUNTER — Encounter: Payer: Self-pay | Admitting: Neurology

## 2015-09-05 ENCOUNTER — Ambulatory Visit (INDEPENDENT_AMBULATORY_CARE_PROVIDER_SITE_OTHER): Payer: Commercial Managed Care - PPO | Admitting: Neurology

## 2015-09-05 VITALS — BP 127/85 | HR 85 | Ht 65.0 in | Wt 254.0 lb

## 2015-09-05 DIAGNOSIS — M25579 Pain in unspecified ankle and joints of unspecified foot: Secondary | ICD-10-CM | POA: Diagnosis not present

## 2015-09-05 MED ORDER — GABAPENTIN 300 MG PO CAPS: 300.0000 mg | ORAL_CAPSULE | Freq: Two times a day (BID) | ORAL | Status: DC

## 2015-09-05 NOTE — Patient Instructions (Signed)
-   will do blood work today - will do X-ray of your feet - will do nerve conduction study - follow up with your orthopedics next Monday - keep your feet warm - increase gabapentin to 300mg  twice a day. - do home exercise with toe walking - follow up in one month

## 2015-09-05 NOTE — Progress Notes (Signed)
NEUROLOGY CLINIC NEW PATIENT NOTE  NAME: LABRITTANY WECHTER DOB: [birth year] REFERRING PHYSICIAN: Charm Rings, MD  I saw Mitzi Davenport as a new consult in the neurovascular clinic today regarding  Chief Complaint  Patient presents with  . Referral    From [facility]  .  HPI: PATRICE MATTHEW is a 29 y.o. female with PMH of left ankle surgery in 2009 who presents as a new patient for Bilateral numbness in toes.   Patient stated that she was in her normal health until 08/11/2015 she was driving and felt sudden onset bilateral toe sharp pain. The pain lasted about 15 minutes and went away. Since then she has been experiencing numbness, tightness, tingling of the left medial 3 toes and right 5 toes. She still experiencing intermittent sharp pain in those toes but lasting less than a minute. She was still able to walk, but did more of heel walking. There was no fall. She was still able to work every day, denies weakness, denies any swollen, redness around the toes, feet, legs. No fever. She went to urgent care on 08/16/2015, was given aspirin for 3 days, but that did not help. She went back on 08/19/2015, was given tramadol and gabapentin, did not help. On 08/31/2015 visit, she was referred here for further evaluation. She also has appointment with orthopedic next Monday.  She had a history of left ankle fracture in 2009, status post left ankle surgery. She felt fine up to surgery, but still has mild limp at left foot. She is not sure whether she has diabetes. But denies any other significant past medical history.  She denies any smoking, alcohol or illicit drugs.  Past Medical History  Diagnosis Date  . PCOS (polycystic ovarian syndrome)   . Chlamydia 12/02/2011  . Infection     UTI  . Fracture of left leg   . Kidney stone     Kidney stone  . Diabetes mellitus     borderline no meds cbg's blood suger testing normal with pregnancy    Past Surgical History  Procedure  Laterality Date  . Tibia fracture surgery    . Cesarean section N/A 05/12/2013    Procedure: Primary cesarean section with delivery of baby boy at 0754.;  Surgeon: Freddrick March. Tenny Craw, MD;  Location: [facility];  Service: Obstetrics;  Laterality: N/A;   Family History  Problem Relation Age of Onset  . Hypertension Other   . Diabetes Other   . Ulcers Mother   . GER disease Mother   . GER disease Sister   . Asthma Brother    Current Outpatient Prescriptions  Medication Sig Dispense Refill  . Chlorpheniramine-Phenylephrine (SINUS & ALLERGY) 4-10 MG per tablet Take 2 tablets by mouth every 4 (four) hours as needed for congestion.    . gabapentin (NEURONTIN) 300 MG capsule Take 1 capsule (300 mg total) by mouth 2 (two) times daily. 30 capsule 0  . ibuprofen (ADVIL,MOTRIN) 200 MG tablet Take 600 mg by mouth every 6 (six) hours as needed for moderate pain.     . traMADol (ULTRAM) 50 MG tablet Take 1 tablet (50 mg total) by mouth every 6 (six) hours as needed. 30 tablet 0   No current facility-administered medications for this visit.   No Known Allergies Social History   Social History  . Marital Status: Married    Spouse Name: N/A  . Number of Children: N/A  . Years of Education: N/A   Occupational History  .  Not on file.   Social History Main Topics  . Smoking status: Current Every Day Smoker -- 0.25 packs/day for 2 years    Types: Cigarettes  . Smokeless tobacco: Never Used     Comment: quit with preg  . Alcohol Use: No     Comment: socially none since pregnancy  . Drug Use: No  . Sexual Activity: Yes    Birth Control/ Protection: None     Comment: desires pregnancy   Other Topics Concern  . Not on file   Social History Narrative    Review of Systems Full 14 system review of systems performed and notable only for those listed, all others are neg:  Constitutional:   Cardiovascular:  Ear/Nose/Throat:   Skin:  Eyes:   Respiratory:   Gastroitestinal:   Genitourinary:    Hematology/Lymphatic:   Endocrine:  Musculoskeletal:  Joint pain, aching muscles Allergy/Immunology:   Neurological: Numbness  Psychiatric:  Sleep: Snoring   Physical Exam  Filed Vitals:   09/05/15 1100  BP: 127/85  Pulse: 85    General - Well nourished, well developed, in no apparent distress.  Ophthalmologic - Sharp disc margins OU.  Cardiovascular - Regular rate and rhythm with no murmur. Carotid pulses were 2+ without bruits .   Neck - supple, no nuchal rigidity .  Mental Status -  Level of arousal and orientation to time, place, and person were intact. Language including expression, naming, repetition, comprehension, reading, and writing was assessed and found intact. Attention span and concentration were normal. Recent and remote memory were intact. Fund of Knowledge was assessed and was intact.  Cranial Nerves II - XII - II - Visual field intact OU. III, IV, VI - Extraocular movements intact. V - Facial sensation intact bilaterally. VII - Facial movement intact bilaterally. VIII - Hearing & vestibular intact bilaterally. X - Palate elevates symmetrically. XI - Chin turning & shoulder shrug intact bilaterally. XII - Tongue protrusion intact.  Motor Strength - The patient's strength was normal in all extremities and pronator drift was absent. No weakness on toe dorsiflexion or plantarflexion. Bulk was normal and fasciculations were absent.   Motor Tone - Muscle tone was assessed at the neck and appendages and was normal.  Reflexes - The patient's reflexes were decreased in all extremities and she had no pathological reflexes.  Sensory - Light touch, temperature/pinprick, vibration and proprioception, and Romberg testing were assessed and were normal except numbness and tingling at plantar aspect of left medial 3 toes and right 5 toes as well as surrounding sole region.    Coordination - The patient had normal movements in the hands and feet with no ataxia or  dysmetria.  Tremor was absent.  Gait and Station - heel walking preference.   Imaging None  Lab Review None    Assessment and Plan:   In summary, Lynnmarie I Wahab is a 29 y.o. female with PMH of left ankle surgery presents with Bilateral numbness in toes. Examination showed numbness tingling localized to left medial 3 toes and right 5 toes at the plantar aspect, without associated weakness for local pathology, to focused for neurological condition. However, will proceed with neuropathy work up, X-ray of feet, and EMG/NCS. Recommend to increase gabapentin from 300 mg daily to 300 mg twice a day. She had appointment with orthopedics next Monday.  - will check A1c, CBC, BMP, TSH, B6, B1 - X-ray of both feet - EMG/NCS - follow up with orthopedics next Monday - increase gabapentin to   twice a day. - follow up in one month  Thank you very much for the opportunity to participate in the care of this patient.  Please do not hesitate to call if any questions or concerns arise.  Orders Placed This Encounter  Procedures  . DG Foot 2 Views Left    Standing Status: Future     Number of Occurrences:      Standing Expiration Date: 11/05/2016    Order Specific Question:  Reason for Exam (SYMPTOM  OR DIAGNOSIS REQUIRED)    Answer:  pain in toes    Order Specific Question:  Is the patient pregnant?    Answer:  No    Order Specific Question:  Preferred imaging location?    Answer:  Internal  . DG Foot 2 Views Right    Standing Status: Future     Number of Occurrences:      Standing Expiration Date: 11/05/2016    Order Specific Question:  Reason for Exam (SYMPTOM  OR DIAGNOSIS REQUIRED)    Answer:  pain in toes    Order Specific Question:  Is the patient pregnant?    Answer:  No    Order Specific Question:  Preferred imaging location?    Answer:  Internal  . Hemoglobin A1c  . CBC  . Basic metabolic panel  . TSH + free T4  . Vitamin B6  . Vitamin B1  . NCV with EMG(electromyography)      Standing Status: Future     Number of Occurrences:      Standing Expiration Date: 09/04/2016    Meds ordered this encounter  Medications  . gabapentin (NEURONTIN) 300 MG capsule    Sig: Take 1 capsule (300 mg total) by mouth 2 (two) times daily.    Dispense:  30 capsule    Refill:  0    Patient Instructions  - will do blood work today - will do X-ray of your feet - will do nerve conduction study - follow up with your orthopedics next Monday - keep your feet warm - increase gabapentin to  twice a day. - do home exercise with toe walking - follow up in one month   Marvel Plan, MD PhD [facility] [address] [place], [place] [address] [phone]

## 2015-09-08 LAB — TSH+FREE T4
FREE T4: 1.08 ng/dL (ref 0.82–1.77)
TSH: 1.13 u[IU]/mL (ref 0.450–4.500)

## 2015-09-08 LAB — VITAMIN B6: Vitamin B6: 5.5 ug/L (ref 2.0–32.8)

## 2015-09-08 LAB — CBC
HEMOGLOBIN: 14 g/dL (ref 11.1–15.9)
Hematocrit: 41.9 % (ref 34.0–46.6)
MCH: 26.8 pg (ref 26.6–33.0)
MCHC: 33.4 g/dL (ref 31.5–35.7)
MCV: 80 fL (ref 79–97)
PLATELETS: 501 10*3/uL — AB (ref 150–379)
RBC: 5.23 x10E6/uL (ref 3.77–5.28)
RDW: 15.4 % (ref 12.3–15.4)
WBC: 5.9 10*3/uL (ref 3.4–10.8)

## 2015-09-08 LAB — BASIC METABOLIC PANEL
BUN / CREAT RATIO: 10 (ref 8–20)
BUN: 7 mg/dL (ref 6–20)
CHLORIDE: 99 mmol/L (ref 97–106)
CO2: 27 mmol/L (ref 18–29)
Calcium: 9.8 mg/dL (ref 8.7–10.2)
Creatinine, Ser: 0.69 mg/dL (ref 0.57–1.00)
GFR calc Af Amer: 137 mL/min/{1.73_m2} (ref >59)
GFR calc non Af Amer: 119 mL/min/{1.73_m2} (ref >59)
GLUCOSE: 90 mg/dL (ref 65–99)
POTASSIUM: 4.7 mmol/L (ref 3.5–5.2)
SODIUM: 139 mmol/L (ref 136–144)

## 2015-09-08 LAB — HEMOGLOBIN A1C
ESTIMATED AVERAGE GLUCOSE: 131 mg/dL
HEMOGLOBIN A1C: 6.2 % — AB (ref 4.8–5.6)

## 2015-09-08 LAB — VITAMIN B1: Thiamine: 117.1 nmol/L (ref 66.5–200.0)

## 2015-09-10 ENCOUNTER — Other Ambulatory Visit: Payer: Self-pay | Admitting: Orthopedic Surgery

## 2015-09-16 ENCOUNTER — Telehealth: Payer: Self-pay

## 2015-09-16 NOTE — Telephone Encounter (Signed)
-----   Message from Marvel Plan, MD sent at 09/11/2015  6:45 PM EST ----- Hi, Samantha [name]:  Could you please let the pt know that her blood tests in our office last time were all within normal limits except platelet was high. She should follow up with her PCP for that. Continue current treatment plan.   Could you also please check to see why her foot X-ray has not done yet? Thanks much,  Marvel Plan, MD PhD Stroke Neurology 09/11/2015 6:45 PM

## 2015-09-16 NOTE — Telephone Encounter (Signed)
Rn explain to patient that her blood work was normal except her platelet were high. Rn explain that Dr. Roda Shutters wants her to see her PCP for follow up regarding the platelets being elevated. Pt stated she does not have a PCP and is looking at [facility]. Rn stated she can go to an urgent care until she finds a PCP. PT verbalized understanding the need to have her platelets evaluated by a PCP. Also she needs to find a PCP within the next couple of months. Patient stated she understand. Patient also stated she is schedule to have surgery on her left leg later this month. Rn stated Dr. Roda Shutters wanted her to have the left and right foot x ray at [facility]. Pt stated she will go this week early in the am.

## 2015-09-17 ENCOUNTER — Encounter (HOSPITAL_BASED_OUTPATIENT_CLINIC_OR_DEPARTMENT_OTHER): Payer: Self-pay | Admitting: *Deleted

## 2015-09-23 ENCOUNTER — Ambulatory Visit
Admission: RE | Admit: 2015-09-23 | Discharge: 2015-09-23 | Disposition: A | Discharge status: Home / Self Care | Payer: Commercial Managed Care - PPO | Source: Ambulatory Visit | Attending: Neurology | Admitting: Neurology

## 2015-09-23 DIAGNOSIS — M25579 Pain in unspecified ankle and joints of unspecified foot: Secondary | ICD-10-CM

## 2015-09-25 ENCOUNTER — Ambulatory Visit (HOSPITAL_BASED_OUTPATIENT_CLINIC_OR_DEPARTMENT_OTHER): Payer: Commercial Managed Care - PPO | Admitting: Anesthesiology

## 2015-09-25 ENCOUNTER — Encounter (HOSPITAL_BASED_OUTPATIENT_CLINIC_OR_DEPARTMENT_OTHER): Payer: Self-pay | Admitting: *Deleted

## 2015-09-25 ENCOUNTER — Ambulatory Visit (HOSPITAL_BASED_OUTPATIENT_CLINIC_OR_DEPARTMENT_OTHER)
Admission: RE | Admit: 2015-09-25 | Discharge: 2015-09-25 | Disposition: A | Discharge status: Home / Self Care | Payer: Commercial Managed Care - PPO | Source: Ambulatory Visit | Attending: Orthopedic Surgery | Admitting: Orthopedic Surgery

## 2015-09-25 ENCOUNTER — Encounter (HOSPITAL_BASED_OUTPATIENT_CLINIC_OR_DEPARTMENT_OTHER)
Admission: RE | Disposition: A | Discharge status: Home / Self Care | Payer: Self-pay | Source: Ambulatory Visit | Attending: Orthopedic Surgery

## 2015-09-25 DIAGNOSIS — M25771 Osteophyte, right ankle: Secondary | ICD-10-CM | POA: Diagnosis not present

## 2015-09-25 DIAGNOSIS — Z6841 Body Mass Index (BMI) 40.0 and over, adult: Secondary | ICD-10-CM | POA: Insufficient documentation

## 2015-09-25 DIAGNOSIS — F17210 Nicotine dependence, cigarettes, uncomplicated: Secondary | ICD-10-CM | POA: Insufficient documentation

## 2015-09-25 DIAGNOSIS — T8484XA Pain due to internal orthopedic prosthetic devices, implants and grafts, initial encounter: Secondary | ICD-10-CM | POA: Diagnosis present

## 2015-09-25 DIAGNOSIS — M25872 Other specified joint disorders, left ankle and foot: Secondary | ICD-10-CM | POA: Insufficient documentation

## 2015-09-25 DIAGNOSIS — M89372 Hypertrophy of bone, left ankle and foot: Secondary | ICD-10-CM | POA: Insufficient documentation

## 2015-09-25 DIAGNOSIS — Y831 Surgical operation with implant of artificial internal device as the cause of abnormal reaction of the patient, or of later complication, without mention of misadventure at the time of the procedure: Secondary | ICD-10-CM | POA: Diagnosis not present

## 2015-09-25 DIAGNOSIS — T8484XD Pain due to internal orthopedic prosthetic devices, implants and grafts, subsequent encounter: Secondary | ICD-10-CM

## 2015-09-25 HISTORY — PX: REMOVAL OF IMPLANT: SHX6451

## 2015-09-25 SURGERY — REMOVAL OF IMPLANT
Anesthesia: General | Site: Ankle | Laterality: Left

## 2015-09-25 MED ORDER — OXYCODONE HCL 5 MG PO TABS: 5.0000 mg | ORAL_TABLET | ORAL | Status: DC | PRN

## 2015-09-25 MED ORDER — SCOPOLAMINE 1 MG/3DAYS TD PT72: 1.0000 | MEDICATED_PATCH | Freq: Once | TRANSDERMAL | Status: DC | PRN

## 2015-09-25 MED ORDER — GLYCOPYRROLATE 0.2 MG/ML IJ SOLN
0.2000 mg | Freq: Once | INTRAMUSCULAR | Status: AC | PRN
  Administered 2015-09-25: 0.2 mg via INTRAVENOUS

## 2015-09-25 MED ORDER — LIDOCAINE HCL (CARDIAC) 20 MG/ML IV SOLN
INTRAVENOUS | Status: AC
  Filled 2015-09-25: qty 5

## 2015-09-25 MED ORDER — ONDANSETRON HCL 4 MG/2ML IJ SOLN
INTRAMUSCULAR | Status: AC
  Filled 2015-09-25: qty 2

## 2015-09-25 MED ORDER — BUPIVACAINE-EPINEPHRINE (PF) 0.5% -1:200000 IJ SOLN
INTRAMUSCULAR | Status: DC | PRN
  Administered 2015-09-25: 30 mL

## 2015-09-25 MED ORDER — DEXAMETHASONE SODIUM PHOSPHATE 10 MG/ML IJ SOLN
INTRAMUSCULAR | Status: AC
  Filled 2015-09-25: qty 1

## 2015-09-25 MED ORDER — FENTANYL CITRATE (PF) 100 MCG/2ML IJ SOLN
INTRAMUSCULAR | Status: AC
  Filled 2015-09-25: qty 2

## 2015-09-25 MED ORDER — MIDAZOLAM HCL 2 MG/2ML IJ SOLN
INTRAMUSCULAR | Status: AC
  Filled 2015-09-25: qty 2

## 2015-09-25 MED ORDER — HYDROMORPHONE HCL 1 MG/ML IJ SOLN
INTRAMUSCULAR | Status: AC
  Filled 2015-09-25: qty 1

## 2015-09-25 MED ORDER — LACTATED RINGERS IV SOLN
INTRAVENOUS | Status: DC
  Administered 2015-09-25: 10 mL/h via INTRAVENOUS
  Administered 2015-09-25: 14:00:00 via INTRAVENOUS

## 2015-09-25 MED ORDER — ONDANSETRON HCL 4 MG/2ML IJ SOLN
INTRAMUSCULAR | Status: DC | PRN
  Administered 2015-09-25: 4 mg via INTRAVENOUS

## 2015-09-25 MED ORDER — CHLORHEXIDINE GLUCONATE 4 % EX LIQD: 60.0000 mL | Freq: Once | CUTANEOUS | Status: DC

## 2015-09-25 MED ORDER — CEFAZOLIN SODIUM-DEXTROSE 2-3 GM-% IV SOLR
INTRAVENOUS | Status: AC
  Filled 2015-09-25: qty 50

## 2015-09-25 MED ORDER — LIDOCAINE HCL (CARDIAC) 20 MG/ML IV SOLN
INTRAVENOUS | Status: AC
  Filled 2015-09-25: qty 10

## 2015-09-25 MED ORDER — MIDAZOLAM HCL 2 MG/2ML IJ SOLN
1.0000 mg | INTRAMUSCULAR | Status: DC | PRN
Start: 2015-09-25 — End: 2015-09-25
  Administered 2015-09-25: 2 mg via INTRAVENOUS

## 2015-09-25 MED ORDER — ONDANSETRON HCL 4 MG/2ML IJ SOLN
INTRAMUSCULAR | Status: AC
  Filled 2015-09-25: qty 4

## 2015-09-25 MED ORDER — PROPOFOL 10 MG/ML IV BOLUS
INTRAVENOUS | Status: DC | PRN
  Administered 2015-09-25: 200 mg via INTRAVENOUS

## 2015-09-25 MED ORDER — SODIUM CHLORIDE 0.9 % IV SOLN: INTRAVENOUS | Status: DC

## 2015-09-25 MED ORDER — LIDOCAINE HCL (CARDIAC) 20 MG/ML IV SOLN
INTRAVENOUS | Status: DC | PRN
  Administered 2015-09-25: 80 mg via INTRAVENOUS

## 2015-09-25 MED ORDER — ONDANSETRON HCL 4 MG/2ML IJ SOLN
4.0000 mg | Freq: Once | INTRAMUSCULAR | Status: DC | PRN
Start: 2015-09-25 — End: 2015-09-25

## 2015-09-25 MED ORDER — OXYCODONE HCL 5 MG PO TABS
5.0000 mg | ORAL_TABLET | Freq: Once | ORAL | Status: AC
  Administered 2015-09-25: 5 mg via ORAL

## 2015-09-25 MED ORDER — DEXAMETHASONE SODIUM PHOSPHATE 4 MG/ML IJ SOLN
INTRAMUSCULAR | Status: DC | PRN
  Administered 2015-09-25: 10 mg via INTRAVENOUS

## 2015-09-25 MED ORDER — GLYCOPYRROLATE 0.2 MG/ML IJ SOLN
INTRAMUSCULAR | Status: AC
  Filled 2015-09-25: qty 1

## 2015-09-25 MED ORDER — CEFAZOLIN SODIUM-DEXTROSE 2-3 GM-% IV SOLR
2.0000 g | INTRAVENOUS | Status: AC
  Administered 2015-09-25: 2 g via INTRAVENOUS

## 2015-09-25 MED ORDER — OXYCODONE HCL 5 MG PO TABS
ORAL_TABLET | ORAL | Status: AC
  Filled 2015-09-25: qty 1

## 2015-09-25 MED ORDER — 0.9 % SODIUM CHLORIDE (POUR BTL) OPTIME
TOPICAL | Status: DC | PRN
  Administered 2015-09-25: 500 mL

## 2015-09-25 MED ORDER — FENTANYL CITRATE (PF) 100 MCG/2ML IJ SOLN
50.0000 ug | INTRAMUSCULAR | Status: AC | PRN
  Administered 2015-09-25 (×3): 50 ug via INTRAVENOUS
  Administered 2015-09-25: 100 ug via INTRAVENOUS

## 2015-09-25 MED ORDER — PHENYLEPHRINE 40 MCG/ML (10ML) SYRINGE FOR IV PUSH (FOR BLOOD PRESSURE SUPPORT)
PREFILLED_SYRINGE | INTRAVENOUS | Status: AC
  Filled 2015-09-25: qty 30

## 2015-09-25 MED ORDER — BUPIVACAINE-EPINEPHRINE 0.5% -1:200000 IJ SOLN
INTRAMUSCULAR | Status: DC | PRN
  Administered 2015-09-25: 20 mL

## 2015-09-25 MED ORDER — HYDROMORPHONE HCL 1 MG/ML IJ SOLN
0.2500 mg | INTRAMUSCULAR | Status: DC | PRN
  Administered 2015-09-25 (×2): 0.5 mg via INTRAVENOUS

## 2015-09-25 MED ORDER — DEXAMETHASONE SODIUM PHOSPHATE 10 MG/ML IJ SOLN
INTRAMUSCULAR | Status: AC
  Filled 2015-09-25: qty 2

## 2015-09-25 SURGICAL SUPPLY — 68 items
APL SKNCLS STERI-STRIP NONHPOA (GAUZE/BANDAGES/DRESSINGS)
BANDAGE ACE 4X5 VEL STRL LF (GAUZE/BANDAGES/DRESSINGS) IMPLANT
BANDAGE ESMARK 6X9 LF (GAUZE/BANDAGES/DRESSINGS) IMPLANT
BENZOIN TINCTURE PRP APPL 2/3 (GAUZE/BANDAGES/DRESSINGS) IMPLANT
BLADE SURG 15 STRL LF DISP TIS (BLADE) ×2 IMPLANT
BLADE SURG 15 STRL SS (BLADE) ×6
BNDG CMPR 9X4 STRL LF SNTH (GAUZE/BANDAGES/DRESSINGS)
BNDG CMPR 9X6 STRL LF SNTH (GAUZE/BANDAGES/DRESSINGS) ×1
BNDG COHESIVE 4X5 TAN STRL (GAUZE/BANDAGES/DRESSINGS) ×2 IMPLANT
BNDG COHESIVE 6X5 TAN STRL LF (GAUZE/BANDAGES/DRESSINGS) IMPLANT
BNDG ESMARK 4X9 LF (GAUZE/BANDAGES/DRESSINGS) IMPLANT
BNDG ESMARK 6X9 LF (GAUZE/BANDAGES/DRESSINGS) ×3
CHLORAPREP W/TINT 26ML (MISCELLANEOUS) ×3 IMPLANT
CLOSURE WOUND 1/2 X4 (GAUZE/BANDAGES/DRESSINGS)
COVER BACK TABLE 60X90IN (DRAPES) ×3 IMPLANT
CUFF TOURNIQUET SINGLE 34IN LL (TOURNIQUET CUFF) ×2 IMPLANT
DECANTER SPIKE VIAL GLASS SM (MISCELLANEOUS) ×2 IMPLANT
DRAPE EXTREMITY T 121X128X90 (DRAPE) ×3 IMPLANT
DRAPE OEC MINIVIEW 54X84 (DRAPES) ×2 IMPLANT
DRAPE SURG 17X23 STRL (DRAPES) IMPLANT
DRAPE U-SHAPE 47X51 STRL (DRAPES) ×3 IMPLANT
DRSG MEPITEL 4X7.2 (GAUZE/BANDAGES/DRESSINGS) ×3 IMPLANT
DRSG PAD ABDOMINAL 8X10 ST (GAUZE/BANDAGES/DRESSINGS) IMPLANT
ELECT REM PT RETURN 9FT ADLT (ELECTROSURGICAL) ×3
ELECTRODE REM PT RTRN 9FT ADLT (ELECTROSURGICAL) ×1 IMPLANT
GAUZE SPONGE 4X4 12PLY STRL (GAUZE/BANDAGES/DRESSINGS) ×3 IMPLANT
GLOVE BIO SURGEON STRL SZ8 (GLOVE) ×3 IMPLANT
GLOVE BIOGEL PI IND STRL 7.0 (GLOVE) IMPLANT
GLOVE BIOGEL PI IND STRL 8 (GLOVE) ×2 IMPLANT
GLOVE BIOGEL PI INDICATOR 7.0 (GLOVE) ×4
GLOVE BIOGEL PI INDICATOR 8 (GLOVE) ×4
GLOVE ECLIPSE 6.5 STRL STRAW (GLOVE) ×2 IMPLANT
GLOVE ECLIPSE 7.5 STRL STRAW (GLOVE) ×3 IMPLANT
GLOVE EXAM NITRILE MD LF STRL (GLOVE) IMPLANT
GOWN STRL REUS W/ TWL LRG LVL3 (GOWN DISPOSABLE) ×1 IMPLANT
GOWN STRL REUS W/ TWL XL LVL3 (GOWN DISPOSABLE) ×2 IMPLANT
GOWN STRL REUS W/TWL LRG LVL3 (GOWN DISPOSABLE) ×3
GOWN STRL REUS W/TWL XL LVL3 (GOWN DISPOSABLE) ×3
NEEDLE HYPO 22GX1.5 SAFETY (NEEDLE) ×2 IMPLANT
PACK BASIN DAY SURGERY FS (CUSTOM PROCEDURE TRAY) ×3 IMPLANT
PAD CAST 4YDX4 CTTN HI CHSV (CAST SUPPLIES) ×1 IMPLANT
PADDING CAST ABS 4INX4YD NS (CAST SUPPLIES)
PADDING CAST ABS COTTON 4X4 ST (CAST SUPPLIES) IMPLANT
PADDING CAST COTTON 4X4 STRL (CAST SUPPLIES) ×3
PADDING CAST COTTON 6X4 STRL (CAST SUPPLIES) ×2 IMPLANT
PENCIL BUTTON HOLSTER BLD 10FT (ELECTRODE) ×2 IMPLANT
REAMER ROD DEEP FLUTE 2.5X950 (INSTRUMENTS) ×2 IMPLANT
SANITIZER HAND PURELL 535ML FO (MISCELLANEOUS) ×3 IMPLANT
SHEET MEDIUM DRAPE 40X70 STRL (DRAPES) ×3 IMPLANT
SLEEVE SCD COMPRESS KNEE MED (MISCELLANEOUS) ×3 IMPLANT
SPLINT FAST PLASTER 5X30 (CAST SUPPLIES)
SPLINT PLASTER CAST FAST 5X30 (CAST SUPPLIES) IMPLANT
SPONGE LAP 18X18 X RAY DECT (DISPOSABLE) ×3 IMPLANT
STOCKINETTE 6  STRL (DRAPES) ×2
STOCKINETTE 6 STRL (DRAPES) ×1 IMPLANT
STRIP CLOSURE SKIN 1/2X4 (GAUZE/BANDAGES/DRESSINGS) IMPLANT
SUCTION FRAZIER TIP 10 FR DISP (SUCTIONS) IMPLANT
SUT ETHILON 3 0 PS 1 (SUTURE) ×2 IMPLANT
SUT MNCRL AB 3-0 PS2 18 (SUTURE) ×5 IMPLANT
SUT VIC AB 0 SH 27 (SUTURE) IMPLANT
SUT VIC AB 2-0 SH 27 (SUTURE)
SUT VIC AB 2-0 SH 27XBRD (SUTURE) IMPLANT
SYR BULB 3OZ (MISCELLANEOUS) ×3 IMPLANT
SYR CONTROL 10ML LL (SYRINGE) ×2 IMPLANT
TOWEL OR 17X24 6PK STRL BLUE (TOWEL DISPOSABLE) ×3 IMPLANT
TUBE CONNECTING 20'X1/4 (TUBING)
TUBE CONNECTING 20X1/4 (TUBING) IMPLANT
UNDERPAD 30X30 (UNDERPADS AND DIAPERS) ×3 IMPLANT

## 2015-09-25 NOTE — Anesthesia Postprocedure Evaluation (Signed)
Anesthesia Post Note  Patient: Samantha [name]  Procedure(s) Performed: Procedure(s) (LRB): LEFT ANKLE REMOVAL OF DEEP  IMPLANT DEBRIDEMENT OF ANTERIOR MEDIAL ANKLE JOINT (Left)  Patient location during evaluation: PACU Anesthesia Type: General Level of consciousness: awake and alert Pain management: pain level controlled Vital Signs Assessment: post-procedure vital signs reviewed and stable Respiratory status: spontaneous breathing, nonlabored ventilation, respiratory function stable and patient connected to nasal cannula oxygen Cardiovascular status: blood pressure returned to baseline and stable Postop Assessment: no signs of nausea or vomiting Anesthetic complications: no    Last Vitals:  Filed Vitals:   09/25/15 1600 09/25/15 1615  BP: 124/86 128/96  Pulse: 77 75  Temp:    Resp: 15 14    Last Pain:  Filed Vitals:   09/25/15 1624  PainSc: 2                  Sheron Robin JENNETTE

## 2015-09-25 NOTE — Anesthesia Preprocedure Evaluation (Addendum)
Anesthesia Evaluation  Patient identified by MRN, date of birth, ID band Patient awake    Reviewed: Allergy & Precautions, NPO status , Patient's Chart, lab work & pertinent test results  History of Anesthesia Complications Negative for: history of anesthetic complications  Airway Mallampati: II  TM Distance: >3 FB Neck ROM: Full    Dental no notable dental hx. (+) Dental Advisory Given   Pulmonary Current Smoker,    Pulmonary exam normal breath sounds clear to auscultation       Cardiovascular negative cardio ROS Normal cardiovascular exam Rhythm:Regular Rate:Normal     Neuro/Psych  Headaches, negative psych ROS   GI/Hepatic negative GI ROS, Neg liver ROS,   Endo/Other  Morbid obesity  Renal/GU negative Renal ROS  negative genitourinary   Musculoskeletal negative musculoskeletal ROS (+)   Abdominal   Peds negative pediatric ROS (+)  Hematology negative hematology ROS (+)   Anesthesia Other Findings   Reproductive/Obstetrics negative OB ROS                            Anesthesia Physical Anesthesia Plan  ASA: III  Anesthesia Plan: General   Post-op Pain Management:    Induction: Intravenous  Airway Management Planned: Oral ETT  Additional Equipment:   Intra-op Plan:   Post-operative Plan: Extubation in OR  Informed Consent: I have reviewed the patients History and Physical, chart, labs and discussed the procedure including the risks, benefits and alternatives for the proposed anesthesia with the patient or authorized representative who has indicated his/her understanding and acceptance.   Dental advisory given  Plan Discussed with: CRNA  Anesthesia Plan Comments:         Anesthesia Quick Evaluation

## 2015-09-25 NOTE — Discharge Instructions (Signed)
Toni Arthurs, MD [facility]  Please read the following information regarding your care after surgery.  Medications  You only need a prescription for the narcotic pain medicine (ex. oxycodone, Percocet, Norco).  All of the other medicines listed below are available over the counter. X acetominophen (Tylenol) 650 mg every 4-6 hours as you need for minor pain X oxycodone as prescribed for moderate to severe pain   Narcotic pain medicine (ex. oxycodone, Percocet, Vicodin) will cause constipation.  To prevent this problem, take the following medicines while you are taking any pain medicine. X docusate sodium (Colace) 100 mg twice a day X senna (Senokot) 2 tablets twice a day  Weight Bearing X Bear weight when you are able on your operated leg or foot in the CAM boot. ? Bear weight only on the heel of your operated foot in the post-op shoe. ? Do not bear any weight on the operated leg or foot.  Cast / Splint / Dressing X Keep your splint or cast clean and dry.  Dont put anything (coat hanger, pencil, etc) down inside of it.  If it gets damp, use a hair dryer on the cool setting to dry it.  If it gets soaked, call the office to schedule an appointment for a cast change. ? Remove your dressing 3 days after surgery and cover the incisions with dry dressings.    After your dressing, cast or splint is removed; you may shower, but do not soak or scrub the wound.  Allow the water to run over it, and then gently pat it dry.  Swelling It is normal for you to have swelling where you had surgery.  To reduce swelling and pain, keep your toes above your nose for at least 3 days after surgery.  It may be necessary to keep your foot or leg elevated for several weeks.  If it hurts, it should be elevated.  Follow Up Call my office at [phone] when you are discharged from the hospital or surgery center to schedule an appointment to be seen two weeks after surgery.  Call my office at  [phone] if you develop a fever >101.5 F, nausea, vomiting, bleeding from the surgical site or severe pain.     Post Anesthesia Home Care Instructions  Activity: Get plenty of rest for the remainder of the day. A responsible adult should stay with you for 24 hours following the procedure.  For the next 24 hours, DO NOT: -Drive a car -Advertising copywriter -Drink alcoholic beverages -Take any medication unless instructed by your physician -Make any legal decisions or sign important papers.  Meals: Start with liquid foods such as gelatin or soup. Progress to regular foods as tolerated. Avoid greasy, spicy, heavy foods. If nausea and/or vomiting occur, drink only clear liquids until the nausea and/or vomiting subsides. Call your physician if vomiting continues.  Special Instructions/Symptoms: Your throat may feel dry or sore from the anesthesia or the breathing tube placed in your throat during surgery. If this causes discomfort, gargle with warm salt water. The discomfort should disappear within 24 hours.  If you had a scopolamine patch placed behind your ear for the management of post- operative nausea and/or vomiting:  1. The medication in the patch is effective for 72 hours, after which it should be removed.  Wrap patch in a tissue and discard in the trash. Wash hands thoroughly with soap and water. 2. You may remove the patch earlier than 72 hours if you experience unpleasant side effects  which may include dry mouth, dizziness or visual disturbances. 3. Avoid touching the patch. Wash your hands with soap and water after contact with the patch.   Regional Anesthesia Blocks  1. Numbness or the inability to move the "blocked" extremity may last from 3-48 hours after placement. The length of time depends on the medication injected and your individual response to the medication. If the numbness is not going away after 48 hours, call your surgeon.  2. The extremity that is blocked will  need to be protected until the numbness is gone and the  Strength has returned. Because you cannot feel it, you will need to take extra care to avoid injury. Because it may be weak, you may have difficulty moving it or using it. You may not know what position it is in without looking at it while the block is in effect.  3. For blocks in the legs and feet, returning to weight bearing and walking needs to be done carefully. You will need to wait until the numbness is entirely gone and the strength has returned. You should be able to move your leg and foot normally before you try and bear weight or walk. You will need someone to be with you when you first try to ensure you do not fall and possibly risk injury.  4. Bruising and tenderness at the needle site are common side effects and will resolve in a few days.  5. Persistent numbness or new problems with movement should be communicated to the surgeon or the [facility] [phone] [facility] [phone]).

## 2015-09-25 NOTE — H&P (Signed)
Samantha [name] is an 29 y.o. female.   Chief Complaint:  Left ankle anterior impingement HPI:  29 y/o female with a h/o left ankle fracture treated by ORIF.  She c/o painful hardware and has anterior impingement symptoms.  She presents now for removal of the deep implants and anterior ankle joint debridement.  Past Medical History  Diagnosis Date  . PCOS (polycystic ovarian syndrome)   . Chlamydia 12/02/2011  . Infection     UTI  . Fracture of left leg   . Kidney stone     Kidney stone    Past Surgical History  Procedure Laterality Date  . Tibia fracture surgery    . Cesarean section N/A 05/12/2013    Procedure: Primary cesarean section with delivery of baby boy at 0754.;  Surgeon: Freddrick March. Tenny Craw, MD;  Location: [facility];  Service: Obstetrics;  Laterality: N/A;    Family History  Problem Relation Age of Onset  . Hypertension Other   . Diabetes Other   . Ulcers Mother   . GER disease Mother   . GER disease Sister   . Asthma Brother    Social History:  reports that she has been smoking Cigarettes.  She has a .5 pack-year smoking history. She has never used smokeless tobacco. She reports that she does not drink alcohol or use illicit drugs.  Allergies: No Known Allergies  Medications Prior to Admission  Medication Sig Dispense Refill  . gabapentin (NEURONTIN) 300 MG capsule Take 1 capsule (300 mg total) by mouth 2 (two) times daily. 30 capsule 0  . ibuprofen (ADVIL,MOTRIN) 200 MG tablet Take 600 mg by mouth every 6 (six) hours as needed for moderate pain.     . traMADol (ULTRAM) 50 MG tablet Take 1 tablet (50 mg total) by mouth every 6 (six) hours as needed. 30 tablet 0  . Chlorpheniramine-Phenylephrine (SINUS & ALLERGY) 4-10 MG per tablet Take 2 tablets by mouth every 4 (four) hours as needed for congestion.      No results found for this or any previous visit (from the past 48 hour(s)). No results found.  ROS  No recent f/c/n/v/wt loss  Blood pressure 120/73, pulse 109,  temperature 98.8 F (37.1 C), resp. rate 18, height  (1.651 m), weight 111.585 kg (246 lb), SpO2 100 %, unknown if currently breastfeeding. Physical Exam  wn wd woman in nad.  A and O x 4.  Mood and affect normal.  EOMi.  resp unlabored.  L ankle with healed surgical incisions lateral and medial.  No signs of infection.  Sens to LT intact.  5/5 strength in PF and DF of the ankle and toes.  No lymphadenoapthy.   Assessment/Plan L ankle painful hardware and anterior impingement - to OR for left tibia and fibula removal of deep implants and anterior joint debridement.  The risks and benefits of the alternative treatment options have been discussed in detail.  The patient wishes to proceed with surgery and specifically understands risks of bleeding, infection, nerve damage, blood clots, need for additional surgery, amputation and death.   Toni Arthurs 2015/10/09, 1:39 PM

## 2015-09-25 NOTE — Anesthesia Procedure Notes (Addendum)
Anesthesia Regional Block:  Popliteal block  Pre-Anesthetic Checklist: ,, timeout performed, Correct Patient, Correct Site, Correct Laterality, Correct Procedure, Correct Position, site marked, Risks and benefits discussed,  Surgical consent,  Pre-op evaluation,  At surgeon's request and post-op pain management  Laterality: Left  Prep: chloraprep       Needles:  Injection technique: Single-shot  Needle Type: Stimiplex      Needle Gauge: 21 and 21 G    Additional Needles:  Procedures: ultrasound guided (picture in chart) Popliteal block Narrative:  Injection made incrementally with aspirations every 5 mL.  Performed by: Personally  Anesthesiologist: JUDD, Corrie Dandy  Additional Notes: Patient tolerated the procedure well without complications   Procedure Name: LMA Insertion Date/Time: 09/25/2015 2:00 PM Performed by: [place] Pre-anesthesia Checklist: Patient identified, Emergency Drugs available, Suction available and Patient being monitored Patient Re-evaluated:Patient Re-evaluated prior to inductionOxygen Delivery Method: Circle System Utilized Preoxygenation: Pre-oxygenation with 100% oxygen Intubation Type: IV induction Ventilation: Mask ventilation without difficulty LMA: LMA inserted LMA Size: 4.0 Number of attempts: 1 Airway Equipment and Method: Bite block Placement Confirmation: positive ETCO2 and breath sounds checked- equal and bilateral Tube secured with: Tape Dental Injury: Teeth and Oropharynx as per pre-operative assessment

## 2015-09-25 NOTE — Op Note (Signed)
NAMEBREEZA, FERRARIO             ACCOUNT NO.:  [id number]  MEDICAL RECORD NO.:  [id number]  LOCATION:                                 FACILITY:  PHYSICIAN:  Toni Arthurs, MD        DATE OF BIRTH:  1985-12-13  DATE OF PROCEDURE:  09/25/2015 DATE OF DISCHARGE:                              OPERATIVE REPORT   PREOPERATIVE DIAGNOSES: 1. Left ankle painful hardware at the medial and lateral malleolus. 2. Left ankle anterior impingement.  POSTOPERATIVE DIAGNOSES: 1. Left ankle painful hardware at the medial and lateral malleolus. 2. Left ankle anterior impingement.  PROCEDURE: 1. Left ankle removal of deep implants from the medial malleolus. 2. Left ankle removal of deep implants from the lateral malleolus     through a separate incision. 3. Left distal tibia excision of anterior osteophytes. 4. AP and lateral radiographs of the left ankle.  SURGEON:  Toni Arthurs, MD.  ANESTHESIA:  General, regional.  ESTIMATED BLOOD LOSS:  Minimal.  TOURNIQUET TIME:  53 minutes 250 mmHg.  COMPLICATIONS:  None apparent.  DISPOSITION:  Extubated, awake and stable to recovery.  INDICATIONS FOR PROCEDURE:  The patient is a 29 year old female with a history of left ankle bimalleolar fracture and syndesmosis disruption back in 2010.  She underwent an open reduction and internal fixation at that time.  She continues to have pain at the ankle both medially and laterally at the site of her hardware.  She also complains of anterior pain and radiographs reveal anterior impingement.  She presents now for operative treatment of these painful conditions.  She understands the risks, benefits, the alternative treatment options and elects surgical treatment.  She specifically understands risks of bleeding, infection, nerve damage, blood clots, need for additional surgery, continued pain, recurrence of her deformity, posttraumatic arthritis, amputation, and death.  PROCEDURE IN DETAIL:  After  preoperative consent was obtained and the correct operative site was identified, the patient was brought to the operating room and placed supine on the operating table.  General anesthesia was induced.  Preoperative antibiotics were administered. Surgical time-out was taken.  Left lower extremity was prepped and draped in standard sterile fashion with tourniquet around the thigh. Extremity was exsanguinated and tourniquet was inflated.  A longitudinal incision was made over the lateral aspect of the fibula.  Sharp dissection was carried down through skin subcutaneous tissue to the superficial aspect of the plate.  Osteotome and rongeur were used to remove the overgrown bone.  All screw heads were cleared of soft tissue. All 6 screws were removed.  The plate was then removed with an osteotome.  The wound was irrigated copiously.  Subcutaneous tissues were approximated with Monocryl, skin was closed with nylon.  Attention was then turned to the medial malleolus.  The previous incision was opened again sharply and dissection was carried down through subcutaneous tissues to the tip of the medial malleolus.  The more anterior screw was identified.  A trephine was used to remove the overgrown bone from the screw.  An easy-out was used to remove the screw in its entirety.  The washer was also removed in its entirety.  Second screw was then identified just posterior  to the first.  Again, the trephine was used to clear the head of the overgrown bone and an easy- out was used to remove the screw in its entirety.  The washer was also removed.  An arthrotomy was then made at the anteromedial joint line.  The anterior joint line was exposed and the overhanging osteophytes were resected with an osteotome and rongeur.  The wound was irrigated copiously.  The joint capsule was repaired with 2-0 Vicryl, subcutaneous tissues were approximated with 3-0 Monocryl, and the skin was closed with a running 3-0  nylon.  Sterile dressings were applied followed by compression wrap.  The skin incisions were infiltrated with 0.5% Marcaine with epinephrine for postoperative pain control.  Tourniquet was released after application of the dressings at 53 minutes.  The patient was awakened from anesthesia and transported to the recovery room in stable condition.  FOLLOWUP PLAN:  The patient will be weightbearing as tolerated on the left lower extremity in a postop CAM walker boot.  She will follow up with me in the office in 2 weeks for suture removal.  RADIOGRAPHS:  AP and lateral radiographs of the left ankle show interval removal of all metallic hardware and removal of anterior osteophytes from the distal tibia.  No other acute injuries are noted.  Degenerative changes are noted at the tibiotalar joint.     Toni Arthurs, MD     JH/MEDQ  D:  09/25/2015  T:  09/25/2015  Job:  [id number]

## 2015-09-25 NOTE — Progress Notes (Signed)
Assisted Dr. M. Judd with left, ultrasound guided, popliteal block. Side rails up, monitors on throughout procedure. See vital signs in flow sheet. Tolerated Procedure well. 

## 2015-09-25 NOTE — Transfer of Care (Signed)
Immediate Anesthesia Transfer of Care Note  Patient: Samantha [name]  Procedure(s) Performed: Procedure(s): LEFT ANKLE REMOVAL OF DEEP  IMPLANT DEBRIDEMENT OF ANTERIOR MEDIAL ANKLE JOINT (Left)  Patient Location: PACU  Anesthesia Type:GA combined with regional for post-op pain  Level of Consciousness: awake, alert  and patient cooperative  Airway & Oxygen Therapy: Patient Spontanous Breathing and Patient connected to face mask oxygen  Post-op Assessment: Report given to RN, Post -op Vital signs reviewed and stable and Patient moving all extremities  Post vital signs: Reviewed and stable  Last Vitals:  Filed Vitals:   09/25/15 1320 09/25/15 1325  BP:  120/73  Pulse: 86 109  Temp:    Resp: 12 18    Complications: No apparent anesthesia complications

## 2015-09-25 NOTE — Brief Op Note (Signed)
09/25/2015  3:11 PM  PATIENT:  Samantha [name]  29 y.o. female  PRE-OPERATIVE DIAGNOSIS:  LEFT ANKLE PAINFUL HARDWARE AND ANTERIOR IMPINGEMENT  POST-OPERATIVE DIAGNOSIS:  LEFT ANKLE PAINFUL HARDWARE AND ANTERIOR   Procedure(s): 1.  LEFT ANKLE REMOVAL OF DEEP  IMPLANT from the medial malleolus 2.  Left ankle removal of deep implant from the lateral malleolus (separate incision) 3.  Left distal tibia excision of anterior osteophytes 4.  AP and lateral xrays of the left ankle  SURGEON:  Toni Arthurs, MD  ASSISTANT: n/a  ANESTHESIA:   General, regional  EBL:  minimal   TOURNIQUET:  53 min at 250 mm Hg  COMPLICATIONS:  None apparent  DISPOSITION:  Extubated, awake and stable to recovery.  DICTATION ID:  [id number]

## 2015-09-26 ENCOUNTER — Encounter (HOSPITAL_BASED_OUTPATIENT_CLINIC_OR_DEPARTMENT_OTHER): Payer: Self-pay | Admitting: Orthopedic Surgery

## 2015-09-29 ENCOUNTER — Other Ambulatory Visit: Payer: Self-pay | Admitting: Neurology

## 2015-10-07 ENCOUNTER — Telehealth: Payer: Self-pay

## 2015-10-07 NOTE — Telephone Encounter (Signed)
Rn was calling patient about her X rays results of both feet were negative. Pt stated she needed some tramadol for pain. Rn stated Dr. Roda Shutters did not prescribed pain medicine for her. Pt stated she had surgery about two weeks ago on her feet. Pt stated she is out of perocet. Pt also stated she has an appt with her surgery this Friday. Rn advised patient to let her orthopedic md about her pain issues and meds.

## 2015-10-17 ENCOUNTER — Ambulatory Visit (INDEPENDENT_AMBULATORY_CARE_PROVIDER_SITE_OTHER): Payer: Commercial Managed Care - PPO | Admitting: Diagnostic Neuroimaging

## 2015-10-17 ENCOUNTER — Encounter (INDEPENDENT_AMBULATORY_CARE_PROVIDER_SITE_OTHER): Payer: Self-pay | Admitting: Diagnostic Neuroimaging

## 2015-10-17 DIAGNOSIS — M25572 Pain in left ankle and joints of left foot: Secondary | ICD-10-CM | POA: Diagnosis not present

## 2015-10-17 DIAGNOSIS — M25579 Pain in unspecified ankle and joints of unspecified foot: Secondary | ICD-10-CM

## 2015-10-17 DIAGNOSIS — M25571 Pain in right ankle and joints of right foot: Secondary | ICD-10-CM

## 2015-10-17 DIAGNOSIS — Z0289 Encounter for other administrative examinations: Secondary | ICD-10-CM

## 2015-10-17 NOTE — Procedures (Signed)
   [facility]  NCS (NERVE CONDUCTION STUDY) WITH EMG (ELECTROMYOGRAPHY) REPORT   STUDY DATE: 10/17/15 PATIENT NAME: MAYSOON LOZADA DOB: 1986/04/20 MRN: [id number]  ORDERING CLINICIAN: Marvel Plan, MD PhD   TECHNOLOGIST: Gearldine Shown  ELECTROMYOGRAPHER: Glenford Bayley. Siriyah Ambrosius, MD  CLINICAL INFORMATION: 30 year old right-handed female with lower extremity numbness since November 2016. Patient denies back pain.    FINDINGS: NERVE CONDUCTION STUDY: Bilateral peroneal and tibial motor responses and F wave latencies are normal. Bilateral H reflex responses are normal. Bilateral sural responses are normal.   NEEDLE ELECTROMYOGRAPHY: Needle examination of right lower extremity vastus medialis, tibials anterior, gastrocnemius is normal.   IMPRESSION:  Normal study. No electrodiagnostic evidence of large fiber neuropathy at this time.   INTERPRETING PHYSICIAN:  Suanne Marker, MD Certified in Neurology, Neurophysiology and Neuroimaging  [facility] [address] [place], [place] [address] [phone]

## 2015-10-20 ENCOUNTER — Telehealth: Payer: Self-pay

## 2015-10-20 NOTE — Telephone Encounter (Signed)
-----   Message from Marvel Plan, MD sent at 10/20/2015  9:35 AM EST ----- Could you please let the patient know that the nerve conduction test done recently in our office was a normal study. Please continue current treatment. Thanks.  Marvel Plan, MD PhD Stroke Neurology 10/20/2015 9:35 AM

## 2015-10-20 NOTE — Telephone Encounter (Signed)
Rn notified patient that her nerve conduction test was normal. Pt does not have a follow up within a month. Pt will call back to schedule another appt.Pt verbalized understanding.

## 2015-10-25 ENCOUNTER — Other Ambulatory Visit: Payer: Self-pay | Admitting: Neurology

## 2016-12-16 LAB — BASIC METABOLIC PANEL
BUN: 8 (ref 4–21)
CREATININE: 0.7 (ref 0.5–1.1)
GLUCOSE: 89
Potassium: 4.1 (ref 3.4–5.3)
Sodium: 137 (ref 137–147)

## 2016-12-16 LAB — LIPID PANEL
CHOLESTEROL: 191 (ref 0–200)
HDL: 33 — AB (ref 35–70)
TRIGLYCERIDES: 89 (ref 40–160)

## 2016-12-16 LAB — HEPATIC FUNCTION PANEL
ALT: 13 (ref 7–35)
AST: 12 — AB (ref 13–35)
Alkaline Phosphatase: 93 (ref 25–125)
Bilirubin, Total: 0.6

## 2017-01-28 ENCOUNTER — Encounter (HOSPITAL_COMMUNITY): Payer: Self-pay | Admitting: Family Medicine

## 2017-01-28 ENCOUNTER — Ambulatory Visit (HOSPITAL_COMMUNITY)
Admission: EM | Admit: 2017-01-28 | Discharge: 2017-01-28 | Disposition: A | Discharge status: Home / Self Care | Payer: BLUE CROSS/BLUE SHIELD | Attending: Emergency Medicine | Admitting: Emergency Medicine

## 2017-01-28 DIAGNOSIS — F17210 Nicotine dependence, cigarettes, uncomplicated: Secondary | ICD-10-CM | POA: Diagnosis not present

## 2017-01-28 DIAGNOSIS — S29019A Strain of muscle and tendon of unspecified wall of thorax, initial encounter: Secondary | ICD-10-CM | POA: Diagnosis not present

## 2017-01-28 DIAGNOSIS — M62830 Muscle spasm of back: Secondary | ICD-10-CM | POA: Diagnosis present

## 2017-01-28 DIAGNOSIS — S39012A Strain of muscle, fascia and tendon of lower back, initial encounter: Secondary | ICD-10-CM | POA: Diagnosis not present

## 2017-01-28 MED ORDER — DIAZEPAM 5 MG/ML IJ SOLN
10.0000 mg | Freq: Once | INTRAMUSCULAR | Status: AC
  Administered 2017-01-28: 10 mg via INTRAMUSCULAR

## 2017-01-28 MED ORDER — KETOROLAC TROMETHAMINE 60 MG/2ML IM SOLN
60.0000 mg | Freq: Once | INTRAMUSCULAR | Status: AC
  Administered 2017-01-28: 60 mg via INTRAMUSCULAR

## 2017-01-28 MED ORDER — IBUPROFEN 600 MG PO TABS: 600.0000 mg | ORAL_TABLET | Freq: Three times a day (TID) | ORAL | 0 refills | Status: DC | PRN

## 2017-01-28 MED ORDER — CYCLOBENZAPRINE HCL 10 MG PO TABS: 10.0000 mg | ORAL_TABLET | Freq: Three times a day (TID) | ORAL | 0 refills | Status: DC | PRN

## 2017-01-28 NOTE — ED Triage Notes (Signed)
Pt here for thoracic back pain since yesterday. sts it happened in her sleep.

## 2017-01-28 NOTE — ED Provider Notes (Signed)
CSN: [id number]     Arrival date & time 01/28/17  1935 History   None    Chief Complaint  Patient presents with  . Back Pain   (Consider location/radiation/quality/duration/timing/severity/associated sxs/prior Treatment) The history is provided by the patient. No language interpreter was used.  Back Pain  Location:  Thoracic spine Quality:  Aching, burning and cramping Radiates to:  Does not radiate Pain severity:  Moderate Pain is:  Unable to specify Onset quality:  Sudden Timing:  Constant Progression:  Unchanged Chronicity:  New Context: lifting heavy objects   Context comment:  Works at daycare Relieved by:  OTC medications Worsened by:  Movement Ineffective treatments:  NSAIDs Associated symptoms: no abdominal pain, no abdominal swelling, no bladder incontinence, no bowel incontinence, no chest pain, no dysuria, no fever, no headaches, no leg pain, no numbness, no paresthesias, no pelvic pain, no perianal numbness, no tingling, no weakness and no weight loss   Risk factors: obesity     Past Medical History:  Diagnosis Date  . Chlamydia 12/02/2011  . Fracture of left leg   . Infection    UTI  . Kidney stone    Kidney stone  . PCOS (polycystic ovarian syndrome)    Past Surgical History:  Procedure Laterality Date  . CESAREAN SECTION N/A 05/12/2013   Procedure: Primary cesarean section with delivery of baby boy at 0754.;  Surgeon: Freddrick March. Tenny Craw, MD;  Location: [facility];  Service: Obstetrics;  Laterality: N/A;  . REMOVAL OF IMPLANT Left 09/25/2015   Procedure: LEFT ANKLE REMOVAL OF DEEP  IMPLANT DEBRIDEMENT OF ANTERIOR MEDIAL ANKLE JOINT;  Surgeon: Toni Arthurs, MD;  Location: [facility];  Service: Orthopedics;  Laterality: Left;  . TIBIA FRACTURE SURGERY     Family History  Problem Relation Age of Onset  . Hypertension Other   . Diabetes Other   . Ulcers Mother   . GER disease Mother   . GER disease Sister   . Asthma Brother    Social History    Substance Use Topics  . Smoking status: Current Every Day Smoker    Packs/day: 0.25    Years: 2.00    Types: Cigarettes  . Smokeless tobacco: Never Used     Comment: quit with preg  . Alcohol use No     Comment: socially none since pregnancy   OB History    Gravida Para Term Preterm AB Living   1 1 1     1    SAB TAB Ectopic Multiple Live Births           1     Review of Systems  Constitutional: Negative for fever and weight loss.  Cardiovascular: Negative for chest pain.  Gastrointestinal: Negative for abdominal pain and bowel incontinence.  Genitourinary: Negative for bladder incontinence, dysuria and pelvic pain.  Musculoskeletal: Positive for back pain.  Neurological: Negative for tingling, weakness, numbness, headaches and paresthesias.  All other systems reviewed and are negative.   Allergies  Patient has no known allergies.  Home Medications   Prior to Admission medications   Medication Sig Start Date End Date Taking? Authorizing Provider  Chlorpheniramine-Phenylephrine (SINUS & ALLERGY) 4-10 MG per tablet Take 2 tablets by mouth every 4 (four) hours as needed for congestion.    Historical Provider, MD  cyclobenzaprine (FLEXERIL) 10 MG tablet Take 1 tablet (10 mg total) by mouth 3 (three) times daily as needed for muscle spasms. 01/28/17   Clancy Gourd, NP  ibuprofen (ADVIL,MOTRIN) 600 MG  tablet Take 1 tablet (600 mg total) by mouth every 8 (eight) hours as needed. 01/28/17   Clancy Gourd, NP   Meds Ordered and Administered this Visit   Medications  ketorolac (TORADOL) injection 60 mg (60 mg Intramuscular Given 01/28/17 2040)  diazepam (VALIUM) injection 10 mg (10 mg Intramuscular Given 01/28/17 2040)    BP 117/82   Pulse 80   Temp 98.6 F (37 C)   Resp 18   SpO2 100%  No data found.   Physical Exam  Constitutional: She is oriented to person, place, and time. She appears well-developed and well-nourished. She is active.  Non-toxic appearance. She  does not have a sickly appearance. She does not appear ill. She appears distressed.  HENT:  Head: Normocephalic.  Right Ear: Tympanic membrane normal.  Left Ear: Tympanic membrane normal.  Nose: Nose normal.  Mouth/Throat: Uvula is midline and mucous membranes are normal.  Eyes: Pupils are equal, round, and reactive to light.  Neck: Trachea normal and normal range of motion. Muscular tenderness present. No Brudzinski's sign and no Kernig's sign noted.  Cardiovascular: Normal rate and regular rhythm.   Pulmonary/Chest: Effort normal and breath sounds normal.  Musculoskeletal:       Right shoulder: She exhibits normal pulse.       Thoracic back: She exhibits tenderness, pain and spasm. She exhibits normal range of motion, no bony tenderness, no swelling, no edema, no deformity, no laceration and normal pulse.       Back:  Neurological: She is alert and oriented to person, place, and time. GCS eye subscore is 4. GCS verbal subscore is 5. GCS motor subscore is 6.  Skin: Skin is warm and dry. No rash noted.  Psychiatric: She has a normal mood and affect. Her speech is normal and behavior is normal.    Urgent Care Course     Procedures (including critical care time)  Labs Review Labs Reviewed - No data to display  Imaging Review No results found.        MDM   1. Thoracic myofascial strain, initial encounter   2. Muscle spasm of back   3. Cigarette smoker    Valium 10 mg IM and Toradol 60mg  IM given for pain,muscle spasm, pt has driver in UC.\  Rest, alternate heat and ice to area. Take meds as directed(ibuprofen and flexeril). Follow up with PCP. Go to ER for new or worsening issues. Return to UC as needed. Pt verbalized understanding to this provider, driver present. Flexeril/ibuprofen script given.    Clancy Gourd, NP 01/28/17 2113

## 2017-01-28 NOTE — Discharge Instructions (Addendum)
Rest, alternate heat and ice to area. Take meds as directed(ibuprofen and flexeril). Follow up with PCP. Go to ER for new or worsening issues. Return to UC as needed.

## 2017-09-24 ENCOUNTER — Ambulatory Visit (INDEPENDENT_AMBULATORY_CARE_PROVIDER_SITE_OTHER): Payer: BLUE CROSS/BLUE SHIELD

## 2017-09-24 ENCOUNTER — Encounter (HOSPITAL_COMMUNITY): Payer: Self-pay | Admitting: Family Medicine

## 2017-09-24 ENCOUNTER — Ambulatory Visit (HOSPITAL_COMMUNITY)
Admission: EM | Admit: 2017-09-24 | Discharge: 2017-09-24 | Disposition: A | Discharge status: Home / Self Care | Payer: BLUE CROSS/BLUE SHIELD | Attending: Family Medicine | Admitting: Family Medicine

## 2017-09-24 ENCOUNTER — Other Ambulatory Visit: Payer: Self-pay

## 2017-09-24 DIAGNOSIS — M25572 Pain in left ankle and joints of left foot: Secondary | ICD-10-CM

## 2017-09-24 DIAGNOSIS — M79605 Pain in left leg: Secondary | ICD-10-CM

## 2017-09-24 MED ORDER — DICLOFENAC SODIUM 75 MG PO TBEC: 75.0000 mg | DELAYED_RELEASE_TABLET | Freq: Two times a day (BID) | ORAL | 0 refills | Status: DC

## 2017-09-24 NOTE — ED Provider Notes (Signed)
[facility]   [id number] 09/24/17 Arrival Time: 1607   SUBJECTIVE:  Samantha [name] is a 31 y.o. female who presents to the urgent care with complaint of leg pain.  The pain is in the left medial malleolar area, where she had orthopedic surgery years ago.  The pins and plate were removed two years ago.  She notes a hx of neuropathy but does not know what is causing the neuropathy.  She does not take her gabapentin regularly.  There is a hx of borderline diabetes.  Pain is worse with weight bearing.   Pt presents today with a burning sensation in her left leg that started yesterday. States that she has neuropathy that she takes gabapentin for but it is not helping with the burning pain that she is experiencing.       Past Medical History:  Diagnosis Date  . Chlamydia 12/02/2011  . Fracture of left leg   . Infection    UTI  . Kidney stone    Kidney stone  . PCOS (polycystic ovarian syndrome)    Family History  Problem Relation Age of Onset  . Hypertension Other   . Diabetes Other   . Ulcers Mother   . GER disease Mother   . GER disease Sister   . Asthma Brother    Social History   Socioeconomic History  . Marital status: Married    Spouse name: Not on file  . Number of children: Not on file  . Years of education: Not on file  . Highest education level: Not on file  Social Needs  . Financial resource strain: Not on file  . Food insecurity - worry: Not on file  . Food insecurity - inability: Not on file  . Transportation needs - medical: Not on file  . Transportation needs - non-medical: Not on file  Occupational History  . Not on file  Tobacco Use  . Smoking status: Current Every Day Smoker    Packs/day: 0.25    Years: 2.00    Pack years: 0.50    Types: Cigarettes  . Smokeless tobacco: Never Used  . Tobacco comment: quit with preg  Substance and Sexual Activity  . Alcohol use: No    Comment: socially none since pregnancy  . Drug use: No  .  Sexual activity: Yes    Birth control/protection: None    Comment: desires pregnancy  Other Topics Concern  . Not on file  Social History Narrative  . Not on file   Current Meds  Medication Sig  . gabapentin (NEURONTIN) 100 MG capsule Take 100 mg by mouth 3 (three) times daily.   No Known Allergies    ROS: As per HPI, remainder of ROS negative.   OBJECTIVE:   Vitals:   09/24/17 1624  BP: 131/86  Pulse: 90  Resp: 16  Temp: 97.9 F (36.6 C)  TempSrc: Oral  SpO2: 97%     General appearance: alert; no distress Eyes: PERRL; EOMI; conjunctiva normal HENT: normocephalic; atraumatic; TMs normal, canal normal, external ears normal without trauma; nasal mucosa normal; oral mucosa normal Neck: supple Back: no CVA tenderness Extremities: no cyanosis or edema; symmetrical with no gross deformities;  Surgical scar left medial malleolus with no localized tenderness. Skin: warm and dry Neurologic: normal gait; grossly normal Psychological: alert and cooperative; normal mood and affect      Labs:  Results for orders placed or performed in visit on 09/05/15  Hemoglobin A1c  Result Value Ref Range  Hgb A1c MFr Bld 6.2 (H) 4.8 - 5.6 %   Est. average glucose Bld gHb Est-mCnc 131 mg/dL  CBC  Result Value Ref Range   WBC 5.9 3.4 - 10.8 x10E3/uL   RBC 5.23 3.77 - 5.28 x10E6/uL   Hemoglobin 14.0 11.1 - 15.9 g/dL   Hematocrit 97.6 73.4 - 46.6 %   MCV 80 79 - 97 fL   MCH 26.8 26.6 - 33.0 pg   MCHC 33.4 31.5 - 35.7 g/dL   RDW 19.3 79.0 - 24.0 %   Platelets 501 (H) 150 - 379 x10E3/uL  Basic metabolic panel  Result Value Ref Range   Glucose 90 65 - 99 mg/dL   BUN 7 6 - 20 mg/dL   Creatinine, Ser 9.73 0.57 - 1.00 mg/dL   GFR calc non Af Amer 119 >59 mL/min/1.73   GFR calc Af Amer 137 >59 mL/min/1.73   BUN/Creatinine Ratio 10 8 - 20   Sodium 139 136 - 144 mmol/L   Potassium 4.7 3.5 - 5.2 mmol/L   Chloride 99 97 - 106 mmol/L   CO2 27 18 - 29 mmol/L   Calcium 9.8 8.7 -  10.2 mg/dL  TSH + free T4  Result Value Ref Range   TSH 1.130 0.450 - 4.500 uIU/mL   Free T4 1.08 0.82 - 1.77 ng/dL  Vitamin B6  Result Value Ref Range   Vitamin B6 5.5 2.0 - 32.8 ug/L  Vitamin B1  Result Value Ref Range   Thiamine 117.1 66.5 - 200.0 nmol/L    Labs Reviewed - No data to display  Dg Ankle Complete Left  Result Date: 09/24/2017 CLINICAL DATA:  Medial malleolar pain. EXAM: LEFT ANKLE COMPLETE - 3+ VIEW COMPARISON:  None. FINDINGS: Evidence of old trauma to the left fibula. Tracks from removed hardware noted in the fibula. No acute fracture, subluxation or dislocation. Removal of prior medial malleolar screws. Small metallic fragments noted in the medial soft tissues. IMPRESSION: Interval removal of prior distal fibular and tibial hardware. Small metallic fragments noted in the medial soft tissues. No acute bony abnormality. Electronically Signed   By: Charlett Nose M.D.   On: 09/24/2017 16:49       ASSESSMENT & PLAN:  1. Left leg pain     Meds ordered this encounter  Medications  . diclofenac (VOLTAREN) 75 MG EC tablet    Sig: Take 1 tablet (75 mg total) by mouth 2 (two) times daily.    Dispense:  14 tablet    Refill:  0    Reviewed expectations re: course of current medical issues. Questions answered. Outlined signs and symptoms indicating need for more acute intervention. Patient verbalized understanding. After Visit Summary given.    Procedures:      Elvina Sidle, MD 09/24/17 1654

## 2017-09-24 NOTE — Discharge Instructions (Signed)
The x-rays show no acute injury and there appears to be normal healing from past surgery.  The symptoms are consistent with an inflammation.  It is possible the nerve is inflamed as it passes the ankle.  This would give symptoms of a neuropathy as well.  The diclofenac should help.  If the pain is not improving, then restart the gabapentin at night and follow up with your primary care provider.

## 2017-09-24 NOTE — ED Triage Notes (Signed)
Pt presents today with a burning sensation in her left leg that started yesterday. States that she has neuropathy that she takes gabapentin for but it is not helping with the burning pain that she is experiencing.

## 2017-11-01 ENCOUNTER — Ambulatory Visit: Payer: BLUE CROSS/BLUE SHIELD | Admitting: Family Medicine

## 2017-11-01 ENCOUNTER — Encounter: Payer: Self-pay | Admitting: Family Medicine

## 2017-11-01 VITALS — BP 128/74 | HR 77 | Temp 97.7°F | Ht 65.0 in | Wt 260.2 lb

## 2017-11-01 DIAGNOSIS — G609 Hereditary and idiopathic neuropathy, unspecified: Secondary | ICD-10-CM

## 2017-11-01 MED ORDER — PREGABALIN 50 MG PO CAPS: 50.0000 mg | ORAL_CAPSULE | Freq: Two times a day (BID) | ORAL | 0 refills | Status: DC

## 2017-11-01 NOTE — Progress Notes (Signed)
Subjective:  Samantha [name] is a 32 y.o. female who presents today with a chief complaint of neuropathy and to establish care.   HPI:  Neuropathy, New Problem Started about 2 years ago. Worsened over that time.  So she was initially diagnosed by her previous PCP who then referred her to neurology.  Reports that she had full workup by neurology that did not have a clear etiology for her neuropathy.  She was then started on gabapentin.  This did well for several months to years however symptoms have steadily worsened over the past few months.  She is now taking gabapentin 300 mg at night, which does not help with the neuropathic pain, and additionally makes her very drowsy and sleepy.  No weakness.  She has pins and needles sensation in both of her hands and feet.  Within the last couple weeks she was started on both prednisone and tramadol.  Neither of these significantly seem to help.  No headache.  No weakness.  Patient reports that she was never given a clear reason for her neuropathy.  She did not have any obvious precipitating events.  Does not have any current obvious alleviating or aggravating factors.  Nicotine dependence, new problem Patient currently smokes about quarter of a pack per day.  She would like to quit however is not ready to commit today.  ROS: Per HPI, otherwise a 10 point review of systems was performed and was negative  PMH:  The following were reviewed and entered/updated in epic: Past Medical History:  Diagnosis Date  . Chlamydia 12/02/2011  . Fracture of left leg   . Infection    UTI  . Kidney stone    Kidney stone  . PCOS (polycystic ovarian syndrome)    Patient Active Problem List   Diagnosis Date Noted  . Idiopathic peripheral neuropathy 11/01/2017   Past Surgical History:  Procedure Laterality Date  . CESAREAN SECTION N/A 05/12/2013   Procedure: Primary cesarean section with delivery of baby boy at 0754.;  Surgeon: Freddrick March. Tenny Craw, MD;  Location:  [facility];  Service: Obstetrics;  Laterality: N/A;  . REMOVAL OF IMPLANT Left 09/25/2015   Procedure: LEFT ANKLE REMOVAL OF DEEP  IMPLANT DEBRIDEMENT OF ANTERIOR MEDIAL ANKLE JOINT;  Surgeon: Toni Arthurs, MD;  Location: [facility];  Service: Orthopedics;  Laterality: Left;  . TIBIA FRACTURE SURGERY     Family History  Problem Relation Age of Onset  . Hypertension Other   . Diabetes Other   . Ulcers Mother   . GER disease Mother   . GER disease Sister   . Asthma Brother    Medications- reviewed and updated Current Outpatient Medications  Medication Sig Dispense Refill  . pregabalin (LYRICA) 50 MG capsule Take 1 capsule (50 mg total) by mouth 2 (two) times daily. 180 capsule 0   No current facility-administered medications for this visit.    Allergies-reviewed and updated No Known Allergies  Social History   Socioeconomic History  . Marital status: Married    Spouse name: None  . Number of children: None  . Years of education: None  . Highest education level: None  Social Needs  . Financial resource strain: None  . Food insecurity - worry: None  . Food insecurity - inability: None  . Transportation needs - medical: None  . Transportation needs - non-medical: None  Occupational History  . None  Tobacco Use  . Smoking status: Current Every Day Smoker  Packs/day: 0.25    Years: 2.00    Pack years: 0.50    Types: Cigarettes  . Smokeless tobacco: Never Used  . Tobacco comment: quit with preg  Substance and Sexual Activity  . Alcohol use: No    Comment: socially none since pregnancy  . Drug use: No  . Sexual activity: Yes    Birth control/protection: None    Comment: desires pregnancy  Other Topics Concern  . None  Social History Narrative  . None   Objective:  Physical Exam: BP 128/74 (BP Location: Right Arm, Patient Position: Sitting, Cuff Size: Large)   Pulse 77   Temp 97.7 F (36.5 C) (Oral)   Ht 5\' 5"  (1.651 m)   Wt 260 lb 3.2 oz (118 kg)    SpO2 97%   BMI 43.30 kg/m   Gen: NAD, resting comfortably CV: RRR with no murmurs appreciated Pulm: NWOB, CTAB with no crackles, wheezes, or rhonchi GI: Normal bowel sounds present. Soft, Nontender, Nondistended. MSK: No edema, cyanosis, or clubbing noted Skin: Warm, dry Neuro: Grossly normal, moves all extremities, decreased sensation in fingertips bilaterally. Psych: Normal affect and thought content  Assessment/Plan:  Idiopathic peripheral neuropathy Obtain records from previous PCP to see what workup has been done thus far.  Given her side effects to gabapentin as well as its ineffectiveness, we will stop this medication.  We will start Lyrica 50 mg twice daily.  Encouraged her to increase by 50 mg daily every week as tolerated.  She will follow-up with me in about 4 weeks.  Consider adding SNRI such as Cymbalta or Effexor to her regimen if symptoms are not controlled with Lyrica.  We need to make sure that her glycemic levels are well controlled-we will obtain records from previous PCP.  If this is not received in a timely manner, would consider checking A1c at next office visit.  Preventative healthcare Obtain records from previous PCP.  Katina Degree. [name], MD 11/01/2017 12:18 PM

## 2017-11-01 NOTE — Assessment & Plan Note (Signed)
Obtain records from previous PCP to see what workup has been done thus far.  Given her side effects to gabapentin as well as its ineffectiveness, we will stop this medication.  We will start Lyrica 50 mg twice daily.  Encouraged her to increase by 50 mg daily every week as tolerated.  She will follow-up with me in about 4 weeks.  Consider adding SNRI such as Cymbalta or Effexor to her regimen if symptoms are not controlled with Lyrica.  We need to make sure that her glycemic levels are well controlled-we will obtain records from previous PCP.  If this is not received in a timely manner, would consider checking A1c at next office visit.

## 2017-11-01 NOTE — Patient Instructions (Signed)
Stop the gabapentin.  We will start Lyrica today.  Please take 1 pill in the morning and 1 pill in the evening.  If you do well with this dose, you can increase to 1 pill in the morning and 2 pills in the evening for a week.  You can then increase to 2 pills in the morning and 2 pills in the evening for a week.  After that, you can increase to 2 pills in the morning and 3 pills leaving for a week.  I would like to see you back in about a month to make sure the medication is working well for you.  If you have a lot of side effects, please let us know and we will see you sooner.  Take care, Dr. [name]

## 2017-11-02 ENCOUNTER — Telehealth: Payer: Self-pay

## 2017-11-02 NOTE — Telephone Encounter (Signed)
PA for Lyrica initiated through Cover My Meds.  Waiting for insurance decision. 

## 2017-11-03 ENCOUNTER — Other Ambulatory Visit: Payer: Self-pay | Admitting: Family Medicine

## 2017-11-03 ENCOUNTER — Telehealth: Payer: Self-pay | Admitting: Family Medicine

## 2017-11-03 NOTE — Telephone Encounter (Signed)
Please advise 

## 2017-11-03 NOTE — Telephone Encounter (Signed)
Patient's PA for Lyrica was denied due to idiopathic neuropathy.  They will cover diabetic neuropathy or neuropathy caused by cancer or chemotherapy agent; nerve pain from fibromyalgia, nerve pain from spinal cord injury, nerve pain from herpes infection, or partial onset seizures, but not idiopathic peripheral neuropathy.  Please advise.

## 2017-11-03 NOTE — Telephone Encounter (Signed)
Copied from CRM [id number]. Topic: Quick Communication - See Telephone Encounter >> Nov 03, 2017  2:17 PM Terisa Starr wrote: CRM for notification. See Telephone encounter for:   11/03/17.  Patient said her insurance denied pregabalin (LYRICA) 50 MG capsule. She wants to know if he can Duloxetine because they will prescribed that one. She wants a 30 day supply instead of 90 because the 90 day is almost $40  Please advise. [id number] [place], [place] - [address] Call back is [phone]

## 2017-11-03 NOTE — Telephone Encounter (Signed)
We can try duloxetine if patient is willing.  Katina Degree. [name], MD 11/03/2017 2:43 PM

## 2017-11-04 MED ORDER — DULOXETINE HCL 30 MG PO CPEP: ORAL_CAPSULE | ORAL | 3 refills | Status: DC

## 2017-11-04 NOTE — Telephone Encounter (Signed)
Spoke with patient and informed her that duloxetine will be sent to her pharmacy.  Will also send appeal letter to her insurance asking them to reverse the denial of Lyrica in hopes they will cover Lyrica for the patient.  Advised patient to try duloxetine in the mean time and let our office know if it is not helping her.  Patient verbalized understanding.

## 2017-11-04 NOTE — Telephone Encounter (Signed)
Appeal letter for Lyrica has been faxed as well.

## 2017-11-04 NOTE — Telephone Encounter (Signed)
Rx Sent in.  Samantha [name]. [name], MD 11/04/2017 9:49 AM

## 2017-11-04 NOTE — Telephone Encounter (Signed)
Patient would like to try duloxetine.

## 2017-11-14 ENCOUNTER — Ambulatory Visit: Payer: BLUE CROSS/BLUE SHIELD | Admitting: Family Medicine

## 2017-11-14 ENCOUNTER — Encounter: Payer: Self-pay | Admitting: Family Medicine

## 2017-11-14 VITALS — BP 110/70 | HR 73 | Temp 97.7°F | Ht 65.0 in | Wt 258.0 lb

## 2017-11-14 DIAGNOSIS — M79641 Pain in right hand: Secondary | ICD-10-CM

## 2017-11-14 DIAGNOSIS — R739 Hyperglycemia, unspecified: Secondary | ICD-10-CM | POA: Diagnosis not present

## 2017-11-14 DIAGNOSIS — M79642 Pain in left hand: Secondary | ICD-10-CM

## 2017-11-14 DIAGNOSIS — G609 Hereditary and idiopathic neuropathy, unspecified: Secondary | ICD-10-CM | POA: Diagnosis not present

## 2017-11-14 MED ORDER — PREDNISONE 10 MG PO TABS: ORAL_TABLET | ORAL | 0 refills | Status: DC

## 2017-11-14 NOTE — Progress Notes (Signed)
    Subjective:  Samantha [name] is a 32 y.o. female who presents today for same-day appointment with a chief complaint of bilateral hand swelling.   HPI:  Bilateral hand Pain/Sweliing, new issue Symptoms started about 3 days ago.  Patient was seen here a few weeks ago for bilateral upper extremity peripheral neuropathy.  She has been on Cymbalta for the past 8 days to help with her peripheral neuropathy.  She still has some pins and needles sensations in her fingers bilaterally.  She has not noticed any improvement since starting the Cymbalta.  Over the last couple of days she has noticed more pain and "tightness" in her hands bilaterally.  It is now difficult for her to make a fist due to the tightness.  No medication changes.  No trauma.  No other obvious precipitating events.  She has not tried any other medications for this.  She has not noted any other obvious alleviating or aggravating factors.  ROS: Per HPI  PMH: She reports that she has been smoking cigarettes.  She has a 0.50 pack-year smoking history. she has never used smokeless tobacco. She reports that she does not drink alcohol or use drugs.  Objective:  Physical Exam: BP 110/70 (BP Location: Left Arm, Patient Position: Sitting, Cuff Size: Large)   Pulse 73   Temp 97.7 F (36.5 C) (Oral)   Ht '5\' 5"'$  (1.651 m)   Wt 258 lb (117 kg)   SpO2 97%   BMI 42.93 kg/m   Gen: NAD, resting comfortably MSK: -Right hand: No deformities.  Nontender to palpation.  Grip strength 4+ out of 5.  Index finger- thumb opposition 4+ out of 5.  Thumbs up 4+ out of 5.  Sensation light touch mildly decreased throughout hand.  Cap refill intact.  Radial pulse 2+. -Left hand: No deformities.  Nontender to palpation.  Strength 4+ out of 5.  Index finger-thumb opposition 4+ out of 5.  Thumbs up 4+ out of 5 sensation to light touch mildly decreased throughout.  Cap refill intact.  Radial pulse 2+.  Assessment/Plan:  Idiopathic peripheral  neuropathy/Bilateral Hand Swelling Unclear how much of her bilateral hand pain is related to progression of neuropathy versus underlying arthropathy.  She did note an episode of bilateral knee pain a few months ago that resolved with prednisone.  We will proceed with comprehensive workup for small joint arthropathy including ANA, CMET, CRP, ESR, rheumatoid factor, and uric acid.  We will also check an A1c and B12 level given her neuropathy.  We are still waiting on records from her neurologist regarding her results of her nerve conduction study and management/evaluation that was performed there.  We will start a prednisone taper to treat both her worsening neuropathy as well as any possible underlying arthropathy until we get more information for her blood work or through her medical records.  Continue Cymbalta 60 mg daily.  If no improvement, would consider trial of Effexor or amitriptyline.  May ultimately need referral to pain management specialist.  She will follow-up with me in a few weeks, or sooner if symptoms are worsening/not improving.  Algis Greenhouse. Jerline Pain, MD 11/14/2017 3:20 PM

## 2017-11-14 NOTE — Progress Notes (Signed)
Received letter of Approval from [facility] for Lyrica: Approved 11/28/2017 - 11/11/2020.

## 2017-11-14 NOTE — Patient Instructions (Signed)
Please start the prednisone.  Please also continue your Cymbalta.  He may take a couple of more weeks before the Cymbalta shows much improvement.  If the Cymbalta does not work for you we have a couple other medications we can try.  If neither of these work, we may need to send you to a neuropathic pain specialist.  We will check blood work today to rule out other causes of your hand pain.  Take care, Dr. [name]

## 2017-11-14 NOTE — Telephone Encounter (Signed)
Received fax Approval Letter for Lyrica. Pt aware.

## 2017-11-14 NOTE — Assessment & Plan Note (Addendum)
Unclear how much of her bilateral hand pain is related to progression of neuropathy versus underlying arthropathy.  She did note an episode of bilateral knee pain a few months ago that resolved with prednisone.  We will proceed with comprehensive workup for small joint arthropathy including ANA, CMET, CRP, ESR, rheumatoid factor, and uric acid.  We will also check an A1c and B12 level given her neuropathy.  We are still waiting on records from her neurologist regarding her results of her nerve conduction study and management/evaluation that was performed there.  We will start a prednisone taper to treat both her worsening neuropathy as well as any possible underlying arthropathy until we get more information for her blood work or through her medical records.  Continue Cymbalta 60 mg daily.  If no improvement, would consider trial of Effexor or amitriptyline.  May ultimately need referral to pain management specialist.  She will follow-up with me in a few weeks, or sooner if symptoms are worsening/not improving.

## 2017-11-15 LAB — URIC ACID: Uric Acid, Serum: 7 mg/dL (ref 2.4–7.0)

## 2017-11-15 LAB — COMPREHENSIVE METABOLIC PANEL
ALBUMIN: 3.6 g/dL (ref 3.5–5.2)
ALT: 10 U/L (ref 0–35)
AST: 11 U/L (ref 0–37)
Alkaline Phosphatase: 80 U/L (ref 39–117)
BUN: 11 mg/dL (ref 6–23)
CALCIUM: 9.4 mg/dL (ref 8.4–10.5)
CO2: 30 mEq/L (ref 19–32)
CREATININE: 0.81 mg/dL (ref 0.40–1.20)
Chloride: 103 mEq/L (ref 96–112)
GFR: 106.1 mL/min (ref >60.00)
Glucose, Bld: 97 mg/dL (ref 70–99)
Potassium: 3.9 mEq/L (ref 3.5–5.1)
SODIUM: 142 meq/L (ref 135–145)
TOTAL PROTEIN: 7 g/dL (ref 6.0–8.3)
Total Bilirubin: 0.5 mg/dL (ref 0.2–1.2)

## 2017-11-15 LAB — CBC
HEMATOCRIT: 44.1 % (ref 36.0–46.0)
Hemoglobin: 14.5 g/dL (ref 12.0–15.0)
MCHC: 32.9 g/dL (ref 30.0–36.0)
MCV: 87 fl (ref 78.0–100.0)
PLATELETS: 410 10*3/uL — AB (ref 150.0–400.0)
RBC: 5.08 Mil/uL (ref 3.87–5.11)
RDW: 14.3 % (ref 11.5–15.5)
WBC: 5.8 10*3/uL (ref 4.0–10.5)

## 2017-11-15 LAB — VITAMIN B12: Vitamin B-12: 303 pg/mL (ref 211–911)

## 2017-11-15 LAB — RHEUMATOID FACTOR

## 2017-11-15 LAB — HEMOGLOBIN A1C: HEMOGLOBIN A1C: 6.5 % (ref 4.6–6.5)

## 2017-11-15 LAB — C-REACTIVE PROTEIN: CRP: 0.7 mg/dL (ref 0.5–20.0)

## 2017-11-15 LAB — TSH: TSH: 1.64 u[IU]/mL (ref 0.35–4.50)

## 2017-11-15 LAB — SEDIMENTATION RATE: Sed Rate: 30 mm/hr — ABNORMAL HIGH (ref 0–20)

## 2017-11-15 LAB — ANA: Anti Nuclear Antibody(ANA): NEGATIVE

## 2017-11-17 ENCOUNTER — Encounter: Payer: Self-pay | Admitting: Family Medicine

## 2017-11-28 ENCOUNTER — Ambulatory Visit (HOSPITAL_COMMUNITY)
Admission: EM | Admit: 2017-11-28 | Discharge: 2017-11-28 | Disposition: A | Discharge status: Home / Self Care | Payer: BLUE CROSS/BLUE SHIELD | Attending: Urgent Care | Admitting: Urgent Care

## 2017-11-28 ENCOUNTER — Encounter (HOSPITAL_COMMUNITY): Payer: Self-pay | Admitting: Emergency Medicine

## 2017-11-28 DIAGNOSIS — J111 Influenza due to unidentified influenza virus with other respiratory manifestations: Secondary | ICD-10-CM

## 2017-11-28 DIAGNOSIS — R69 Illness, unspecified: Principal | ICD-10-CM

## 2017-11-28 DIAGNOSIS — R52 Pain, unspecified: Secondary | ICD-10-CM

## 2017-11-28 DIAGNOSIS — R070 Pain in throat: Secondary | ICD-10-CM

## 2017-11-28 DIAGNOSIS — R059 Cough, unspecified: Secondary | ICD-10-CM

## 2017-11-28 DIAGNOSIS — R05 Cough: Secondary | ICD-10-CM

## 2017-11-28 MED ORDER — BENZONATATE 100 MG PO CAPS: 100.0000 mg | ORAL_CAPSULE | Freq: Three times a day (TID) | ORAL | 0 refills | Status: DC | PRN

## 2017-11-28 MED ORDER — PSEUDOEPHEDRINE HCL ER 120 MG PO TB12: 120.0000 mg | ORAL_TABLET | Freq: Two times a day (BID) | ORAL | 3 refills | Status: DC

## 2017-11-28 MED ORDER — HYDROCODONE-HOMATROPINE 5-1.5 MG/5ML PO SYRP: 5.0000 mL | ORAL_SOLUTION | Freq: Every evening | ORAL | 0 refills | Status: DC | PRN

## 2017-11-28 NOTE — ED Triage Notes (Signed)
PT reports cough, sneezing, body aches, for 2 days.   PT reports left ear pain

## 2017-11-28 NOTE — ED Provider Notes (Signed)
  MRN: [id number] DOB: 27-Jul-1986  Subjective:   Samantha [name] is a 32 y.o. female presenting for 2 day history of body aches, productive cough, sneezing, left ear pain, sore throat, headaches, sinus congestion/pain. Cough has elicited mild wheezing, shob. Has tried DayQuil consistently with minimal relief. Denies chest pain, n/v, abdominal pain, rashes. Smokes 4 cigarettes per day. Denies history of asthma. Denies history of allergies.  No current facility-administered medications for this encounter.   Current Outpatient Medications:  .  DULoxetine (CYMBALTA) 30 MG capsule, Take 1 tablet daily for 1 week, then increase to 2 tablets daily., Disp: 60 capsule, Rfl: 3   Misako has No Known Allergies.  Meaghan  has a past medical history of Arthritis, Chicken pox, Chlamydia (12/02/2011), Depression, Fracture of left leg, Frequent headaches, Infection, Kidney stone, Migraines, Neuropathy, and PCOS (polycystic ovarian syndrome). Also  has a past surgical history that includes Tibia fracture surgery; Cesarean section (N/A, 05/12/2013); and Removal of implant (Left, 09/25/2015).  Objective:   Vitals: BP (!) 132/92   Pulse (!) 105   Temp 98.3 F (36.8 C) (Oral)   Resp 16   Wt 260 lb (117.9 kg)   SpO2 96%   BMI 43.27 kg/m   Physical Exam  Constitutional: She is oriented to person, place, and time. She appears well-developed and well-nourished.  HENT:  TM's intact bilaterally, no effusions or erythema. Nasal turbinates pink, dry, nasal passages minimally patent. Mild bilateral maxillary sinus tenderness. Oropharynx clear, mucous membranes moist.  Eyes: Right eye exhibits no discharge. Left eye exhibits no discharge.  Neck: Normal range of motion. Neck supple.  Cardiovascular: Normal rate, regular rhythm and intact distal pulses. Exam reveals no gallop and no friction rub.  No murmur heard. Pulmonary/Chest: No respiratory distress. She has no wheezes. She has no rales.  Lymphadenopathy:   She has no cervical adenopathy.  Neurological: She is alert and oriented to person, place, and time.  Skin: Skin is warm and dry.  Psychiatric: She has a normal mood and affect.   Assessment and Plan :   Influenza-like illness  Cough  Body aches  Throat pain  Will manage supportively. Return-to-clinic precautions discussed, patient verbalized understanding.    Wallis Bamberg, [place] 11/28/17 2114

## 2017-11-28 NOTE — Discharge Instructions (Signed)
Hydrate well with at least 2 liters (1 gallon) of water daily. For sore throat try using a honey-based tea. Use 3 teaspoons of honey with juice squeezed from half lemon. Place shaved pieces of ginger into 1/2-1 cup of water and warm over stove top. Then mix the ingredients and repeat every 4 hours as needed. You may take 500mg  Tylenol with ibuprofen 400-600mg  every 6 hours for fever, body aches, throat pain and inflammation.

## 2017-12-01 ENCOUNTER — Ambulatory Visit: Payer: BLUE CROSS/BLUE SHIELD | Admitting: Family Medicine

## 2017-12-05 ENCOUNTER — Ambulatory Visit: Payer: BLUE CROSS/BLUE SHIELD | Admitting: Physician Assistant

## 2017-12-05 ENCOUNTER — Encounter: Payer: Self-pay | Admitting: Physician Assistant

## 2017-12-05 ENCOUNTER — Ambulatory Visit (HOSPITAL_COMMUNITY)
Admission: RE | Admit: 2017-12-05 | Discharge: 2017-12-05 | Disposition: A | Discharge status: Home / Self Care | Payer: BLUE CROSS/BLUE SHIELD | Source: Ambulatory Visit | Attending: Physician Assistant | Admitting: Physician Assistant

## 2017-12-05 VITALS — BP 130/90 | HR 104 | Temp 97.6°F | Ht 65.0 in | Wt 257.4 lb

## 2017-12-05 DIAGNOSIS — J3489 Other specified disorders of nose and nasal sinuses: Secondary | ICD-10-CM | POA: Insufficient documentation

## 2017-12-05 DIAGNOSIS — G259 Extrapyramidal and movement disorder, unspecified: Secondary | ICD-10-CM

## 2017-12-05 DIAGNOSIS — R29818 Other symptoms and signs involving the nervous system: Secondary | ICD-10-CM

## 2017-12-05 DIAGNOSIS — R479 Unspecified speech disturbances: Secondary | ICD-10-CM

## 2017-12-05 NOTE — Progress Notes (Signed)
Samantha [name] is a 32 y.o. female here for hand pain.  I acted as a Neurosurgeon for [employer], PA-C Corky Mull, LPN  History of Present Illness:   Chief Complaint  Patient presents with  . Bilateral hand pain    Hand Pain   Incident onset: Pt c/o bilateral hand pain times one month. There was no injury mechanism. The pain is present in the right hand and left hand. The quality of the pain is described as aching (tingling sensation on both palms). The pain radiates to the left arm and right arm (to bicep area). The pain is at a severity of 6/10. The pain is moderate. The pain has been constant since the incident. Associated symptoms include numbness and tingling. Pertinent negatives include no chest pain. Associated symptoms comments: Stiffness off and on.. The symptoms are aggravated by lifting (cold air outside makes stiffness and pain worse.). She has tried acetaminophen and NSAIDs (Diclofenac gel 1%) for the symptoms. The treatment provided no relief.   Mom was diagnosed with lupus at age 49, patient is concerned that she has this.  Prednisone has been helpful in the remote past, however she was prescribed it by Dr. [name] on 11/14/17 and it has not helped. Gabapentin was only effective for the first time that she took it.  With her visit with Dr. [name] on 11/14/2017 extensive labs were completed without any clear explanation for her symptoms.  Since she has seen Dr. [name] on 11/14/17, her bilateral hand stiffness/swelling/pain has traveled to her wrists and forearms and to her biceps bilaterally.  She has now developed intermittent stuttering and difficulty with word finding. No changes in vision. No slurred speech. No difficulty swallowing. Symptoms are worst when laying down. She also endorses some itchiness in the center of her chest with intermittent rash.  She is R handed. It appears that she was being followed at [facility]. Workup has been negative. She  had a nerve conduction study that was negative. Last seen 10/17/15.  She is with her husband today.  Past Medical History:  Diagnosis Date  . Arthritis   . Chicken pox   . Chlamydia 12/02/2011  . Depression   . Fracture of left leg   . Frequent headaches   . Infection    UTI  . Kidney stone    Kidney stone  . Migraines   . Neuropathy   . PCOS (polycystic ovarian syndrome)      Social History   Socioeconomic History  . Marital status: Married    Spouse name: Not on file  . Number of children: Not on file  . Years of education: Not on file  . Highest education level: Not on file  Social Needs  . Financial resource strain: Not on file  . Food insecurity - worry: Not on file  . Food insecurity - inability: Not on file  . Transportation needs - medical: Not on file  . Transportation needs - non-medical: Not on file  Occupational History  . Not on file  Tobacco Use  . Smoking status: Current Every Day Smoker    Packs/day: 0.25    Years: 2.00    Pack years: 0.50    Types: Cigarettes  . Smokeless tobacco: Never Used  . Tobacco comment: quit with preg  Substance and Sexual Activity  . Alcohol use: No    Comment: socially none since pregnancy  . Drug use: No  . Sexual activity: Yes    Birth control/protection:  None    Comment: desires pregnancy  Other Topics Concern  . 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 CESAREAN SECTION N/A 05/12/2013   Procedure: Primary cesarean section with delivery of baby boy at 0754.;  Surgeon: Freddrick March. Tenny Craw, MD;  Location: [facility];  Service: Obstetrics;  Laterality: N/A;  . REMOVAL OF IMPLANT Left 09/25/2015   Procedure: LEFT ANKLE REMOVAL OF DEEP  IMPLANT DEBRIDEMENT OF ANTERIOR MEDIAL ANKLE JOINT;  Surgeon: Toni Arthurs, MD;  Location: [facility];  Service: Orthopedics;  Laterality: Left;  . TIBIA FRACTURE SURGERY      Family History  Problem Relation Age of Onset  . Hypertension Other   . Diabetes Other   . Ulcers Mother   . GER disease Mother   . Asthma Mother   . Heart attack Mother   . Lupus Mother   . Cancer Father   . Drug abuse Father   . GER disease Sister   . Asthma Brother   . Birth defects Maternal Grandfather   . Heart attack Maternal Grandfather   . High blood pressure Maternal Grandfather   . Stroke Maternal Grandfather   . Autism Son   . Learning disabilities Son     No Known Allergies  Current Medications:   Current Outpatient Medications:  .  DULoxetine (CYMBALTA) 30 MG capsule, Take 1 tablet daily for 1 week, then increase to 2 tablets daily., Disp: 60 capsule, Rfl: 3 .  HYDROcodone-homatropine (HYCODAN) 5-1.5 MG/5ML syrup, Take 5 mLs by mouth at bedtime as needed., Disp: 100 mL, Rfl: 0 .  benzonatate (TESSALON) 100 MG capsule, Take 1-2 capsules (100-200 mg total) by mouth 3 (three) times daily as needed for cough. (Patient not taking: Reported on 12/05/2017), Disp: 60 capsule, Rfl: 0   Review of Systems:   Review of Systems  Constitutional: Positive for malaise/fatigue. Negative for chills, fever and weight loss.  Respiratory: Negative for cough.   Cardiovascular: Negative for chest pain, palpitations and leg swelling.  Musculoskeletal: Positive for myalgias.  Skin: Positive for itching and rash.  Neurological: Positive for tingling, sensory change, speech change and numbness. Negative for focal weakness.  Psychiatric/Behavioral: The patient is nervous/anxious.     Vitals:   Vitals:   12/05/17 1136  BP: 130/90  Pulse: (!) 104  Temp: 97.6 F (36.4 C)  TempSrc: Oral  SpO2: 98%  Weight: 257 lb 6.1 oz (116.7 kg)  Height: 5\' 5"  (1.651 m)     Body mass index is 42.83 kg/m.  Physical Exam:   Physical Exam  Constitutional: She appears well-developed. She is cooperative.   Non-toxic appearance. She does not have a sickly appearance. She does not appear ill. No distress.  Cardiovascular: Normal rate, regular rhythm, S1 normal, S2 normal, normal heart sounds and normal pulses.  No LE edema  Pulmonary/Chest: Effort normal and breath sounds normal.  Neurological: She is alert. No cranial nerve deficit or sensory deficit. Coordination abnormal. GCS eye subscore is 4. GCS verbal subscore is 5. GCS motor subscore is 6.  Slight shuffling gait with walking. Bilateral grip strength 4/5. Decreased ROM secondary to stiffness and pain of passive extension of bilateral arms.   Skin: Skin is warm, dry and intact.  Small area of macular erythema to central chest,  no evidence of ecchymosis, no tenderness with palpation  Psychiatric: She has a normal mood and affect. Her speech is normal and behavior is normal.  Tearful and anxious  Nursing note and vitals reviewed.   Assessment and Plan:    Emercyn was seen today for bilateral hand pain.  Diagnoses and all orders for this visit:  Movement disorder, Difficulty with speech, Other symptoms and signs involving the nervous system Dr. Earlene Plater in to also examine patient. Given worsening progression of symptoms will obtain stat CT of the head. Further recommendations based on CT scan results. Given how quickly her symptoms are progressing since she last saw neurology, I recommended that she also reach out to them to schedule a follow-up. Patient verbalized understanding. I recommended that if anything changes in the meantime or if she develops new symptoms, she should go to the ER. She is agreeable to this.  -     CT Head Wo Contrast; Future  . Reviewed expectations re: course of current medical issues. . Discussed self-management of symptoms. . Outlined signs and symptoms indicating need for more acute intervention. . Patient verbalized understanding and all questions were answered. . See orders for this visit as documented in  the electronic medical record. . Patient received an After-Visit Summary.  CMA or LPN served as scribe during this visit. History, Physical, and Plan performed by medical provider. Documentation and orders reviewed and attested to.  Jarold Motto, PA-C

## 2017-12-05 NOTE — Patient Instructions (Signed)
Please make an appointment with your neurologist ASAP.  We will be in touch regarding your CT scan results.  If you have any sudden, severe changes in symptoms please go to the ER!

## 2017-12-08 ENCOUNTER — Encounter: Payer: Self-pay | Admitting: Family Medicine

## 2017-12-08 ENCOUNTER — Encounter (HOSPITAL_COMMUNITY): Payer: Self-pay

## 2017-12-08 ENCOUNTER — Inpatient Hospital Stay (HOSPITAL_COMMUNITY)
Admission: EM | Admit: 2017-12-08 | Discharge: 2017-12-12 | DRG: 059 | Disposition: A | Discharge status: Home / Self Care | Payer: BLUE CROSS/BLUE SHIELD | Attending: Nephrology | Admitting: Nephrology

## 2017-12-08 ENCOUNTER — Other Ambulatory Visit: Payer: Self-pay

## 2017-12-08 ENCOUNTER — Emergency Department (HOSPITAL_COMMUNITY): Payer: BLUE CROSS/BLUE SHIELD

## 2017-12-08 ENCOUNTER — Ambulatory Visit: Payer: BLUE CROSS/BLUE SHIELD | Admitting: Family Medicine

## 2017-12-08 VITALS — BP 136/86 | HR 108 | Temp 97.8°F | Ht 65.0 in | Wt 257.0 lb

## 2017-12-08 DIAGNOSIS — G609 Hereditary and idiopathic neuropathy, unspecified: Secondary | ICD-10-CM | POA: Diagnosis present

## 2017-12-08 DIAGNOSIS — G379 Demyelinating disease of central nervous system, unspecified: Secondary | ICD-10-CM | POA: Diagnosis present

## 2017-12-08 DIAGNOSIS — R531 Weakness: Secondary | ICD-10-CM

## 2017-12-08 DIAGNOSIS — Z87440 Personal history of urinary (tract) infections: Secondary | ICD-10-CM | POA: Diagnosis not present

## 2017-12-08 DIAGNOSIS — F17210 Nicotine dependence, cigarettes, uncomplicated: Secondary | ICD-10-CM | POA: Diagnosis present

## 2017-12-08 DIAGNOSIS — F32A Depression, unspecified: Secondary | ICD-10-CM | POA: Diagnosis present

## 2017-12-08 DIAGNOSIS — F329 Major depressive disorder, single episode, unspecified: Secondary | ICD-10-CM | POA: Diagnosis present

## 2017-12-08 DIAGNOSIS — R4789 Other speech disturbances: Secondary | ICD-10-CM | POA: Diagnosis not present

## 2017-12-08 DIAGNOSIS — Z79899 Other long term (current) drug therapy: Secondary | ICD-10-CM | POA: Diagnosis not present

## 2017-12-08 DIAGNOSIS — Z87442 Personal history of urinary calculi: Secondary | ICD-10-CM

## 2017-12-08 DIAGNOSIS — F8081 Childhood onset fluency disorder: Secondary | ICD-10-CM | POA: Diagnosis not present

## 2017-12-08 DIAGNOSIS — M1990 Unspecified osteoarthritis, unspecified site: Secondary | ICD-10-CM | POA: Diagnosis present

## 2017-12-08 DIAGNOSIS — R9389 Abnormal findings on diagnostic imaging of other specified body structures: Secondary | ICD-10-CM

## 2017-12-08 DIAGNOSIS — F17200 Nicotine dependence, unspecified, uncomplicated: Secondary | ICD-10-CM | POA: Diagnosis present

## 2017-12-08 DIAGNOSIS — G35 Multiple sclerosis: Principal | ICD-10-CM | POA: Diagnosis present

## 2017-12-08 DIAGNOSIS — G360 Neuromyelitis optica [Devic]: Secondary | ICD-10-CM | POA: Diagnosis present

## 2017-12-08 DIAGNOSIS — G1220 Motor neuron disease, unspecified: Secondary | ICD-10-CM | POA: Diagnosis not present

## 2017-12-08 DIAGNOSIS — Z720 Tobacco use: Secondary | ICD-10-CM

## 2017-12-08 HISTORY — DX: Tobacco use: Z72.0

## 2017-12-08 LAB — CBC WITH DIFFERENTIAL/PLATELET
BASOS ABS: 0 10*3/uL (ref 0.0–0.1)
Basophils Relative: 1 %
Eosinophils Absolute: 0.3 10*3/uL (ref 0.0–0.7)
Eosinophils Relative: 4 %
HEMATOCRIT: 42.7 % (ref 36.0–46.0)
Hemoglobin: 14.3 g/dL (ref 12.0–15.0)
Lymphocytes Relative: 30 %
Lymphs Abs: 2.5 10*3/uL (ref 0.7–4.0)
MCH: 28.9 pg (ref 26.0–34.0)
MCHC: 33.5 g/dL (ref 30.0–36.0)
MCV: 86.3 fL (ref 78.0–100.0)
MONO ABS: 0.5 10*3/uL (ref 0.1–1.0)
Monocytes Relative: 6 %
NEUTROS ABS: 4.9 10*3/uL (ref 1.7–7.7)
NEUTROS PCT: 59 %
Platelets: 388 10*3/uL (ref 150–400)
RBC: 4.95 MIL/uL (ref 3.87–5.11)
RDW: 13.9 % (ref 11.5–15.5)
WBC: 8.1 10*3/uL (ref 4.0–10.5)

## 2017-12-08 LAB — URINALYSIS, ROUTINE W REFLEX MICROSCOPIC
Bilirubin Urine: NEGATIVE
Glucose, UA: NEGATIVE mg/dL
Hgb urine dipstick: NEGATIVE
KETONES UR: NEGATIVE mg/dL
LEUKOCYTES UA: NEGATIVE
Nitrite: NEGATIVE
PH: 6 (ref 5.0–8.0)
PROTEIN: NEGATIVE mg/dL
Specific Gravity, Urine: 1.019 (ref 1.005–1.030)

## 2017-12-08 LAB — POC URINE PREG, ED: PREG TEST UR: NEGATIVE

## 2017-12-08 LAB — BASIC METABOLIC PANEL
ANION GAP: 11 (ref 5–15)
BUN: 5 mg/dL — ABNORMAL LOW (ref 6–20)
CALCIUM: 9 mg/dL (ref 8.9–10.3)
CO2: 23 mmol/L (ref 22–32)
Chloride: 103 mmol/L (ref 101–111)
Creatinine, Ser: 0.78 mg/dL (ref 0.44–1.00)
Glucose, Bld: 89 mg/dL (ref 65–99)
POTASSIUM: 3.7 mmol/L (ref 3.5–5.1)
SODIUM: 137 mmol/L (ref 135–145)

## 2017-12-08 MED ORDER — ACETAMINOPHEN 325 MG PO TABS: 650.0000 mg | ORAL_TABLET | Freq: Four times a day (QID) | ORAL | Status: DC | PRN

## 2017-12-08 MED ORDER — ONDANSETRON HCL 4 MG/2ML IJ SOLN
4.0000 mg | Freq: Three times a day (TID) | INTRAMUSCULAR | Status: DC | PRN
  Administered 2017-12-12 (×2): 4 mg via INTRAVENOUS
  Filled 2017-12-08 (×2): qty 2

## 2017-12-08 MED ORDER — ENOXAPARIN SODIUM 40 MG/0.4ML ~~LOC~~ SOLN
40.0000 mg | SUBCUTANEOUS | Status: DC
  Administered 2017-12-09 – 2017-12-12 (×4): 40 mg via SUBCUTANEOUS
  Filled 2017-12-08 (×4): qty 0.4

## 2017-12-08 MED ORDER — DULOXETINE HCL 30 MG PO CPEP
30.0000 mg | ORAL_CAPSULE | Freq: Two times a day (BID) | ORAL | Status: DC
  Administered 2017-12-09 – 2017-12-12 (×8): 30 mg via ORAL
  Filled 2017-12-08 (×9): qty 1

## 2017-12-08 MED ORDER — ZOLPIDEM TARTRATE 5 MG PO TABS: 5.0000 mg | ORAL_TABLET | Freq: Every evening | ORAL | Status: DC | PRN

## 2017-12-08 MED ORDER — GADOBENATE DIMEGLUMINE 529 MG/ML IV SOLN
20.0000 mL | Freq: Once | INTRAVENOUS | Status: AC
  Administered 2017-12-08: 20 mL via INTRAVENOUS

## 2017-12-08 MED ORDER — SODIUM CHLORIDE 0.9 % IV SOLN
1000.0000 mg | INTRAVENOUS | Status: DC
  Administered 2017-12-09 – 2017-12-11 (×3): 1000 mg via INTRAVENOUS
  Filled 2017-12-08 (×5): qty 8

## 2017-12-08 MED ORDER — ACETAMINOPHEN 325 MG PO TABS
650.0000 mg | ORAL_TABLET | Freq: Once | ORAL | Status: AC
  Administered 2017-12-08: 650 mg via ORAL
  Filled 2017-12-08: qty 2

## 2017-12-08 NOTE — ED Notes (Signed)
Patient transported to MRI 

## 2017-12-08 NOTE — ED Notes (Signed)
Pt sttes she does not want to wait 8-10 hours for MRI. Will inform PA.

## 2017-12-08 NOTE — Consult Note (Signed)
Neurology Consultation  Reason for Consult: Abnormal brain MRI, multiple neurological symptoms including tingling, stiffness, weakness. Referring Physician: Langston Masker PA-C, Dr. Thomasene Lot  CC: Generalized weakness, tingling, stiffness, weakness, abnormal MRI  History is obtained from: Patient, chart  HPI: Samantha [name] is a 32 y.o. female with past medical history of migraines and a diagnosis of neuropathy because of pain and tingling and numbness of her legs that started somewhere in the end of 2015, came to the emergency room for evaluation of worsening stiffness in her hands and generalized sensation of weakness. The patient says that she was very physically active up until 2015, doing Zumba classes daily, when she started noticing that she has having a hard time with exercising due to pain and tingling in her feet.  She sought a neurological consultation, where a workup for peripheral neuropathy was pursued but nothing substantial was discovered in terms of her definitive diagnosis.  She continues to have a myriad of neurological symptoms including tingling, numbness, generalized weakness.  She also describes an episode 2 years ago where she lost vision in 1 of her eyes that lasted for about a month.  She saw an ophthalmologist for that, who thought that this was an ocular migraine and should get better with treatment for headache.  Symptoms resolved in about a month. She does not have a family history of multiple sclerosis.  She has a family history of lupus-in her mother. Prior to 2015, she had not had any neurological symptoms. Today she was sent to the emergency room for evaluation of burning pain in her lower extremities and stiffness of her hands that has been getting worse over the past 1 week. On further review of system, she also reports having had stuttering speech at some point.  She complains of flulike symptoms that had started last week.  No current headaches.  No vision changes.   No chest pain shortness of breath nausea vomiting at this time. MRI of the brain was done in the emergency room which was concerning for nonspecific lesions which could also reflect an underlying demyelinating process.  I reviewed the MRI of the brain without contrast and while looking at the images going down to the spine, there were concerning signal changes in her C-spine.  I requested for a MRI of the C-spine with and without contrast and also a brain MRI with contrast.  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  She also has a small enhancing lesion in her brain.  She currently denies any urinary symptoms.  Denies any bowel symptoms.  ROS: ROS was performed and is negative except as noted in the HPI.  Past Medical History:  Diagnosis Date  . Arthritis   . Chicken pox   . Chlamydia 12/02/2011  . Depression   . Fracture of left leg   . Frequent headaches   . Infection    UTI  . Kidney stone    Kidney stone  . Migraines   . Neuropathy   . PCOS (polycystic ovarian syndrome)     Family History  Problem Relation Age of Onset  . Hypertension Other   . Diabetes Other   . Ulcers Mother   . GER disease Mother   . Asthma Mother   . Heart attack Mother   . Lupus Mother   . Cancer Father   . Drug  abuse Father   . GER disease Sister   . Asthma Brother   . Birth defects Maternal Grandfather   . Heart attack Maternal Grandfather   . High blood pressure Maternal Grandfather   . Stroke Maternal Grandfather   . Autism Son   . Learning disabilities Son    Social History:   reports that she has been smoking cigarettes.  She has a 0.50 pack-year smoking history. she has never used smokeless tobacco. She reports that she does not drink alcohol or use drugs.  Medications No current facility-administered medications  for this encounter.   Current Outpatient Medications:  .  acetaminophen (TYLENOL) 500 MG tablet, Take 500-1,000 mg by mouth every 6 (six) hours as needed for moderate pain., Disp: , Rfl:  .  DULoxetine (CYMBALTA) 30 MG capsule, Take 1 tablet daily for 1 week, then increase to 2 tablets daily. (Patient taking differently: Take 30 mg by mouth 2 (two) times daily. ), Disp: 60 capsule, Rfl: 3  Exam: Current vital signs: BP (!) 144/96 (BP Location: Right Arm)   Pulse 73   Temp 97.8 F (36.6 C) (Oral)   Resp 18   Ht '5\' 5"'$  (1.651 m)   Wt 117 kg (258 lb)   SpO2 98%   BMI 42.93 kg/m  Vital signs in last 24 hours: Temp:  [97.8 F (36.6 C)] 97.8 F (36.6 C) (03/07 1301) Pulse Rate:  [73-108] 73 (03/07 1715) Resp:  [18] 18 (03/07 1715) BP: (136-147)/(86-96) 144/96 (03/07 1715) SpO2:  [97 %-99 %] 98 % (03/07 1715) Weight:  [116.6 kg (257 lb)-117 kg (258 lb)] 117 kg (258 lb) (03/07 1301)  GENERAL: Awake, alert in NAD HEENT: - Normocephalic and atraumatic, dry mm, no LN++, no Thyromegally LUNGS - Clear to auscultation bilaterally with no wheezes CV - S1S2 RRR, no m/r/g, equal pulses bilaterally. ABDOMEN - Soft, nontender, nondistended with normoactive BS Ext: warm, well perfused, intact peripheral pulses, no edema  NEURO:  Mental Status: AA&Ox3  Language: speech is non-dysarthric.  Naming, repetition, fluency, and comprehension intact. Cranial Nerves: PERRL. EOMI, no evidence of afferent pupillary defect, visual fields full, no facial asymmetry, facial sensation intact, hearing intact, tongue/uvula/soft palate midline, normal sternocleidomastoid and trapezius muscle strength. No evidence of tongue atrophy or fibrillations Motor: Grossly symmetric 5/5 strength all over with mild increased tone in the right upper extremity.  She has some grip strength weakness but that is more related to increased tone and not frank weakness.  Symmetric 5/5 in both lower extremities. Tone: is mild increased in  both upper extremities. Sensation- Intact to light touch bilaterally with no extinction Coordination: FTN intact bilaterally, no ataxia in BLE. Deep tendon reflexes: 1+ upper extremity reflexes, 1+ knee jerks, 2+ ankle jerks, toes downgoing.  No Hoffmann's.  No clonus. Gait- deferred  Labs I have reviewed labs in epic and the results pertinent to this consultation are: CBC    Component Value Date/Time   WBC 5.8 11/14/2017 1512   RBC 5.08 11/14/2017 1512   HGB 14.5 11/14/2017 1512   HGB 14.0 09/05/2015 1158   HCT 44.1 11/14/2017 1512   HCT 41.9 09/05/2015 1158   PLT 410.0 (H) 11/14/2017 1512   PLT 501 (H) 09/05/2015 1158   MCV 87.0 11/14/2017 1512   MCV 80 09/05/2015 1158   MCH 26.8 09/05/2015 1158   MCH 27.4 08/06/2014 1603   MCHC 32.9 11/14/2017 1512   RDW 14.3 11/14/2017 1512   RDW 15.4 09/05/2015 1158   LYMPHSABS 1.8 04/06/2011  0235   MONOABS 0.5 04/06/2011 0235   EOSABS 0.1 04/06/2011 0235   BASOSABS 0.1 04/06/2011 0235   CMP     Component Value Date/Time   NA 142 11/14/2017 1512   NA 137 12/16/2016   K 3.9 11/14/2017 1512   CL 103 11/14/2017 1512   CO2 30 11/14/2017 1512   GLUCOSE 97 11/14/2017 1512   BUN 11 11/14/2017 1512   BUN 8 12/16/2016   CREATININE 0.81 11/14/2017 1512   CALCIUM 9.4 11/14/2017 1512   PROT 7.0 11/14/2017 1512   ALBUMIN 3.6 11/14/2017 1512   AST 11 11/14/2017 1512   ALT 10 11/14/2017 1512   ALKPHOS 80 11/14/2017 1512   BILITOT 0.5 11/14/2017 1512   GFRNONAA 119 09/05/2015 1158   GFRAA 137 09/05/2015 1158   Imaging I have reviewed the images obtained:  CT-scan of the brain -unremarkable  MRI examination of the brain -there are scattered T2 hyperintensities, nonspecific versus concerning for edema admission.  On the brain MRI, the part of the C-spine that was visualized concerning for T2 hyperintensities. MRI of the C-spine ordered and reviewed-enhancing lesions on at least 2 levels.  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  Assessment:  32 year old woman with a past medical history of migraines and a diagnosis of neuropathy presented for evaluation of worsening pain and stiffness in legs and arms. Brain and C-spine MRI consistent with a demyelinating process. Differentials include multiple sclerosis versus neuromyelitis optica. At this time, because of worsening symptoms and enhancement that is seen on imaging, she would benefit from inpatient admission and administration of high-dose IV steroids.  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 --PT, OT, speech therapy assessments to assess for rehab needs --She will need follow-up with outpatient neurology-Dr. Felecia Shelling who is an MS specialist for recommendations on long-term disease modifying therapy. --At some point, she should also receive MRI of the thoracic spine with and without contrast.  Since she just received contrast for the brain and C-spine MRIs, this will not be done for the next 48 hours.  This can be done as outpatient as well. --We will defer the need for the spinal tap to the outpatient neurologist --Check neuromyelitis optica antibodies. --Check for ANA, double-stranded DNA, ACE levels, B12 levels to evaluate for common mimics of demyelinating disease.  Neurology service will peripherally follow this patient. I had a detailed discussion with patient, her sister and step father about the differentials and possible diagnosis and importance of f/u with outpatient neurology for long term disease modifying therapy.  -- Amie Portland, MD [facility] Pager: [phone] If 7pm to 7am, please call on call as listed on AMION.

## 2017-12-08 NOTE — Progress Notes (Signed)
    Subjective:  Samantha [name] is a 32 y.o. female who presents today for same-day appointment with a chief complaint of left hand weakness.   HPI:  Left Hand Weakness, established problem, worsening Patient seen at this clinic several times over the past few months for bilateral hand pain and peripheral neuropathy.  She had extensive rheumatologic workup including ANA, CMET, CRP, ESR, rheumatoid factor, and uric acid all of which were essentially negative.  She was started on Cymbalta 60 mg daily which helped some with her neuropathic pain.  3 days ago she came to our clinic with significant worsening of her symptoms.  She additionally started having intermittent stuttering and difficulty with word finding.  A stat head CT was ordered which was negative.  Patient called her neurologist who told her there would be "a long time"over she would be able to get in to their office.  Symptoms have continued to worsen over the last 3 days.  She is now barely able to move her left upper extremity.  She has had continued difficulty with speech and word finding.  She has not noticed anything that exacerbated or alleviated her symptoms.  Patient reports that she had significant difficulty getting to her appointment today due to inability to use her arms.  ROS: Positive for chest rash, fatigue, lethargy and per HPI. Otherwise, a complete ROS was negative.   PMH: Current smoker. PMH significant for depression.   Objective:  Physical Exam: BP 136/86 (BP Location: Right Arm, Patient Position: Sitting, Cuff Size: Large)   Pulse (!) 108   Temp 97.8 F (36.6 C) (Oral)   Ht '5\' 5"'$  (1.651 m)   Wt 257 lb (116.6 kg)   SpO2 97%   BMI 42.77 kg/m   Gen: NAD, resting comfortably CV: RRR with no murmurs appreciated Pulm: NWOB, CTAB with no crackles, wheezes, or rhonchi Neuro: Cranial nerves II through XII intact.  4 out of 5 strength in left upper extremity.  Entire left upper extremity very sensitive to  light touch.  Right upper extremity with 5 out of 5 strength.  Assessment/Plan:  Upper extremity weakness, peripheral neuropathy, stuttering, word finding difficulty Unclear underlying etiology for patient's symptoms.  It is very concerning that her symptoms have rapidly progressed over the last several days.  She is now at the point where it is difficult for her to drive and carry out her activities of daily living due to her upper extremity weakness.  She has had negative workup thus far including rheumatologic workup and head CT.  Given her inability to drive, as well as abrupt change in her neurological symptoms, we will be arranging for patient to go to the ED via EMS.  She will need brain MRI to look for other central causes of symptoms including multiple sclerosis.  Guillain Aris Lot is a possibility given her recent flulike illness, however patient had some peripheral neuropathy symptoms preceding this infection.  Patient has a HIGH level of medical complexity due to number of diagnoses/treatment options and risk of complications.   Algis Greenhouse. Jerline Pain, MD 12/08/2017 11:47 AM

## 2017-12-08 NOTE — ED Triage Notes (Signed)
Pt arrives via EMS with complaints of bilateral lower and upper extremities x 1 month. Pt was sent from pcp for MRI. Stroke screen negative. PT reports having neuropathy x several years and takes cymbalta.   130/90 Hr 70 rr 18 spo2 97% RA

## 2017-12-08 NOTE — H&P (Signed)
History and Physical    Samantha [name] ZOX:[id number] DOB: June 20, 1986 DOA: 12/08/2017  Referring MD/NP/PA:   PCP: Ardith Dark, MD   Patient coming from:  The patient is coming from home.  At baseline, pt is independent for most of ADL.    Chief Complaint: Diffuse weakness and numbness in all extremities  HPI: Samantha [name] is a 32 y.o. female with medical history significant of depression, tobacco abuse, peripheral neuropathy, migraine headaches, who presents with diffuse weakness and numbness in all extremities.  Pt states that he has been having numbness and tingling in her legs since end of 2015. She was diagnosed with peripheral neuropathy. She states that her symptoms has worsened recently. Currently she has diffused weakness, numbness in all extremities. No facial droop. Has stuttering speech at some point. Patient states that she had blurry vision last summer which lasted for about 1 month, and then resolved. Currently patient does not have blurry vision. Patient denies chest pain, shortness breath, cough, no fever or chills. No nausea, vomiting, diarrhea, abdominal pain, symptoms of UTI. She had flulike symptoms last week, which has largely resolved.  ED Course: pt was found to have negative pregnancy test, and a urinalysis, CBC, BMP normal. MRI of the brain without contrast showed nonspecific lesions which could also reflect an underlying demyelinating process. MRI of brain and c spin with contrast showed enhancing lesions in the cervical spinal cord extending from C1-C4, almost into longitudinally extensive transverse myelitis, consistent with neuromyelitis optica. She also has a small enhancing lesion in her brain. Pt is admitted to tele bed as inpt. Dr. Wilford Corner of Neuro was consulted.   Review of Systems:   General: no fevers, chills, no body weight gain, has fatigue HEENT: no blurry vision, hearing changes or sore throat Respiratory: no dyspnea, coughing, wheezing CV: no  chest pain, no palpitations GI: no nausea, vomiting, abdominal pain, diarrhea, constipation GU: no dysuria, burning on urination, increased urinary frequency, hematuria  Ext: no leg edema Neuro: has diffuse weakness and numbness in all extremities, had blurry vision. Skin: no rash, no skin tear. MSK: No muscle spasm, no deformity, no limitation of range of movement in spin Heme: No easy bruising.  Travel history: No recent long distant travel.  Allergy: No Known Allergies  Past Medical History:  Diagnosis Date  . Arthritis   . Chicken pox   . Chlamydia 12/02/2011  . Depression   . Fracture of left leg   . Frequent headaches   . Infection    UTI  . Kidney stone    Kidney stone  . Migraines   . Neuropathy   . PCOS (polycystic ovarian syndrome)   . Tobacco abuse     Past Surgical History:  Procedure Laterality Date  . CESAREAN SECTION N/A 05/12/2013   Procedure: Primary cesarean section with delivery of baby boy at 0754.;  Surgeon: Freddrick March. Tenny Craw, MD;  Location: [facility];  Service: Obstetrics;  Laterality: N/A;  . REMOVAL OF IMPLANT Left 09/25/2015   Procedure: LEFT ANKLE REMOVAL OF DEEP  IMPLANT DEBRIDEMENT OF ANTERIOR MEDIAL ANKLE JOINT;  Surgeon: Toni Arthurs, MD;  Location: [facility];  Service: Orthopedics;  Laterality: Left;  . TIBIA FRACTURE SURGERY      Social History:  reports that she has been smoking cigarettes.  She has a 0.50 pack-year smoking history. she has never used smokeless tobacco. She reports that she does not drink alcohol or use drugs.  Family History:  Family History  Problem Relation Age of Onset  . Hypertension Other   . Diabetes Other   . Ulcers Mother   . GER disease Mother   . Asthma Mother   . Heart attack Mother   . Lupus Mother   . Cancer Father   . Drug abuse Father   . GER disease Sister   . Asthma Brother   . Birth defects Maternal Grandfather   . Heart attack Maternal Grandfather   . High blood pressure Maternal  Grandfather   . Stroke Maternal Grandfather   . Autism Son   . Learning disabilities Son      Prior to Admission medications   Medication Sig Start Date End Date Taking? Authorizing Provider  acetaminophen (TYLENOL) 500 MG tablet Take 500-1,000 mg by mouth every 6 (six) hours as needed for moderate pain.   Yes [provider]  DULoxetine (CYMBALTA) 30 MG capsule Take 1 tablet daily for 1 week, then increase to 2 tablets daily. Patient taking differently: Take 30 mg by mouth 2 (two) times daily.  11/04/17  Yes Ardith Dark, MD    Physical Exam: Vitals:   12/08/17 1301 12/08/17 1715 12/08/17 2331  BP: (!) 147/91 (!) 144/96 117/70  Pulse: 85 73 79  Resp: 18 18 18   Temp: 97.8 F (36.6 C)    TempSrc: Oral    SpO2: 99% 98% 100%  Weight: 117 kg (258 lb)    Height: 5\' 5"  (1.651 m)     General: Not in acute distress HEENT:       Eyes: PERRL, EOMI, no scleral icterus.       ENT: No discharge from the ears and nose, no pharynx injection, no tonsillar enlargement.        Neck: No JVD, no bruit, no mass felt. Heme: No neck lymph node enlargement. Cardiac: S1/S2, RRR, No murmurs, No gallops or rubs. Respiratory: No rales, wheezing, rhonchi or rubs. GI: Soft, nondistended, nontender, no rebound pain, no organomegaly, BS present. GU: No hematuria Ext: No pitting leg edema bilaterally. 2+DP/PT pulse bilaterally. Musculoskeletal: No joint deformities, No joint redness or warmth, no limitation of ROM in spin. Skin: No rashes.  Neuro: Alert, oriented X3, cranial nerves II-XII grossly intact. Muscle strength 3/5 in both arm and 4/5 in both legs. Sensation to light touch intact.  Psych: Patient is not psychotic, no suicidal or hemocidal ideation.  Labs on Admission: I have personally reviewed following labs and imaging studies  CBC: Recent Labs  Lab 12/08/17 2240  WBC 8.1  NEUTROABS 4.9  HGB 14.3  HCT 42.7  MCV 86.3  PLT 388   Basic Metabolic Panel: Recent Labs  Lab  12/08/17 2240  NA 137  K 3.7  CL 103  CO2 23  GLUCOSE 89  BUN 5*  CREATININE 0.78  CALCIUM 9.0   GFR: Estimated Creatinine Clearance: 130.3 mL/min (by C-G formula based on SCr of 0.78 mg/dL). Liver Function Tests: No results for input(s): AST, ALT, ALKPHOS, BILITOT, PROT, ALBUMIN in the last 168 hours. No results for input(s): LIPASE, AMYLASE in the last 168 hours. No results for input(s): AMMONIA in the last 168 hours. Coagulation Profile: No results for input(s): INR, PROTIME in the last 168 hours. Cardiac Enzymes: No results for input(s): CKTOTAL, CKMB, CKMBINDEX, TROPONINI in the last 168 hours. BNP (last 3 results) No results for input(s): PROBNP in the last 8760 hours. HbA1C: No results for input(s): HGBA1C in the last 72 hours. CBG: No results for input(s): GLUCAP in the  last 168 hours. Lipid Profile: No results for input(s): CHOL, HDL, LDLCALC, TRIG, CHOLHDL, LDLDIRECT in the last 72 hours. Thyroid Function Tests: No results for input(s): TSH, T4TOTAL, FREET4, T3FREE, THYROIDAB in the last 72 hours. Anemia Panel: Recent Labs    12/08/17 2240  VITAMINB12 537   Urine analysis:    Component Value Date/Time   COLORURINE YELLOW 12/08/2017 2328   APPEARANCEUR CLOUDY (A) 12/08/2017 2328   LABSPEC 1.019 12/08/2017 2328   PHURINE 6.0 12/08/2017 2328   GLUCOSEU NEGATIVE 12/08/2017 2328   HGBUR NEGATIVE 12/08/2017 2328   BILIRUBINUR NEGATIVE 12/08/2017 2328   KETONESUR NEGATIVE 12/08/2017 2328   PROTEINUR NEGATIVE 12/08/2017 2328   UROBILINOGEN 0.2 03/12/2013 1700   NITRITE NEGATIVE 12/08/2017 2328   LEUKOCYTESUR NEGATIVE 12/08/2017 2328   Sepsis Labs: @LABRCNTIP (procalcitonin:4,lacticidven:4) )No results found for this or any previous visit (from the past 240 hour(s)).   Radiological Exams on Admission: Mr Brain Wo Contrast  Addendum Date: 12/08/2017   ADDENDUM REPORT: 12/08/2017 20:00 ADDENDUM: Additionally, on the sagittal FLAIR sequence, hyperintense  T2-weighted signal lesions can be seen in the C2 and C3 levels and, possibly, also at the level of the C4-5 disc. This increases the likelihood of demyelinating disease, with differential considerations including multiple sclerosis or neuromyelitis optica spectrum disorders. In addition to the postcontrast imaging of the brain previously recommended, MRI of the cervical spine with and without contrast is also suggested. I discussed this with Dr. Wilford Corner at 7:59 p.m. on 12/08/2017. Electronically Signed   By: Deatra Robinson M.D.   On: 12/08/2017 20:00   Result Date: 12/08/2017 CLINICAL DATA:  Left hand weakness EXAM: MRI HEAD WITHOUT CONTRAST TECHNIQUE: Multiplanar, multiecho pulse sequences of the brain and surrounding structures were obtained without intravenous contrast. COMPARISON:  Head CT 12/05/2017 FINDINGS: Brain: The midline structures are normal. There is no acute infarct or acute hemorrhage. No mass lesion, hydrocephalus, dural abnormality or extra-axial collection. There are multiple lesions of the periventricular and deep white matter. Some of these lesions are oriented perpendicularly to the long axis of the lateral ventricles. There is corresponding low T1-weighted signal for multiple lesions. No age-advanced or lobar predominant atrophy. No chronic microhemorrhage or superficial siderosis. Vascular: Major intracranial arterial and venous sinus flow voids are preserved. Skull and upper cervical spine: The visualized skull base, calvarium, upper cervical spine and extracranial soft tissues are normal. Sinuses/Orbits: No fluid levels or advanced mucosal thickening. No mastoid or middle ear effusion. Normal orbits. IMPRESSION: 1. Multiple deep and periventricular white matter lesions in a pattern that is nonspecific but concerning for demyelinating disease. CSF sampling is recommended. Additionally, postcontrast imaging might be helpful to assess for active demyelination or inflammation. 2. No acute  ischemia. Electronically Signed: By: Deatra Robinson M.D. On: 12/08/2017 19:17   Mr Brain W Contrast  Result Date: 12/08/2017 CLINICAL DATA:  Suspected demyelinating disease. EXAM: MRI HEAD WITH CONTRAST MRI CERVICAL SPINE WITHOUT AND WITH CONTRAST TECHNIQUE: Multiplanar, multiecho pulse sequences of the cervical spine, to include the craniocervical junction and cervicothoracic junction, were obtained without and with intravenous contrast. Multiplanar, multi echo pulse sequences of the brain and surrounding structures were obtained after the administration of intravenous contrast. CONTRAST:  20mL MULTIHANCE GADOBENATE DIMEGLUMINE 529 MG/ML IV SOLN COMPARISON:  None. FINDINGS: MRI HEAD FINDINGS Postcontrast imaging only was obtained, as a supplement to the earlier performed noncontrast MRI of the brain. There is no parenchymal contrast enhancement. There is a small developmental venous anomaly in the right frontal lobe. No optic nerve  enhancement. MRI CERVICAL SPINE FINDINGS Alignment: Physiologic. Vertebrae: No fracture, evidence of discitis, or bone lesion. Cord: There are hyperintense T2-weighted signal lesions within the spinal cord at the C2-C5 levels. The lesions predominantly involve the central spinal cord. There is sparing of the right dorsal cord at C2 and C3 levels. The left dorsal cord is spared at the C4 and C5 levels. There is mild contrast enhancement at the C2 and C3 levels. Posterior Fossa, vertebral arteries, paraspinal tissues: Visualized posterior fossa is normal. Vertebral artery flow voids are preserved. No prevertebral soft tissue swelling. Disc levels: No disc herniation, spinal canal stenosis or neural foraminal stenosis. IMPRESSION: 1. Multiple large brain tense T2-weighted signal lesions within the cervical spinal cord, most suggestive of demyelinating disease. Contrast enhancement within the most superior lesions is consistent with active demyelination. 2. No abnormal contrast enhancement  within the brain. 3. In conjunction with the earlier performed noncontrast brain MRI, the findings altogether remain most suggestive of demyelinating disease, likely acute on chronic. Multiple sclerosis remains the primary consideration and neuromyelitis optica spectrum disorders the most likely alternative. Other demyelinating diseases, such as acute disseminated encephalomyelitis, are less likely. Electronically Signed   By: Deatra Robinson M.D.   On: 12/08/2017 21:48   Mr Cervical Spine W Wo Contrast  Result Date: 12/08/2017 CLINICAL DATA:  Suspected demyelinating disease. EXAM: MRI HEAD WITH CONTRAST MRI CERVICAL SPINE WITHOUT AND WITH CONTRAST TECHNIQUE: Multiplanar, multiecho pulse sequences of the cervical spine, to include the craniocervical junction and cervicothoracic junction, were obtained without and with intravenous contrast. Multiplanar, multi echo pulse sequences of the brain and surrounding structures were obtained after the administration of intravenous contrast. CONTRAST:  65mL MULTIHANCE GADOBENATE DIMEGLUMINE 529 MG/ML IV SOLN COMPARISON:  None. FINDINGS: MRI HEAD FINDINGS Postcontrast imaging only was obtained, as a supplement to the earlier performed noncontrast MRI of the brain. There is no parenchymal contrast enhancement. There is a small developmental venous anomaly in the right frontal lobe. No optic nerve enhancement. MRI CERVICAL SPINE FINDINGS Alignment: Physiologic. Vertebrae: No fracture, evidence of discitis, or bone lesion. Cord: There are hyperintense T2-weighted signal lesions within the spinal cord at the C2-C5 levels. The lesions predominantly involve the central spinal cord. There is sparing of the right dorsal cord at C2 and C3 levels. The left dorsal cord is spared at the C4 and C5 levels. There is mild contrast enhancement at the C2 and C3 levels. Posterior Fossa, vertebral arteries, paraspinal tissues: Visualized posterior fossa is normal. Vertebral artery flow voids are  preserved. No prevertebral soft tissue swelling. Disc levels: No disc herniation, spinal canal stenosis or neural foraminal stenosis. IMPRESSION: 1. Multiple large brain tense T2-weighted signal lesions within the cervical spinal cord, most suggestive of demyelinating disease. Contrast enhancement within the most superior lesions is consistent with active demyelination. 2. No abnormal contrast enhancement within the brain. 3. In conjunction with the earlier performed noncontrast brain MRI, the findings altogether remain most suggestive of demyelinating disease, likely acute on chronic. Multiple sclerosis remains the primary consideration and neuromyelitis optica spectrum disorders the most likely alternative. Other demyelinating diseases, such as acute disseminated encephalomyelitis, are less likely. Electronically Signed   By: Deatra Robinson M.D.   On: 12/08/2017 21:48     EKG: Independently reviewed.  Sinus rhythm, QTC 426, no ischemic change.  Assessment/Plan Principal Problem:   Demyelinating disease (HCC) Active Problems:   Tobacco abuse   Depression   Demyelinating disease (HCC): Dr. Wilford Corner of neuro was consulted. given MRI findings, it  is likely due to demyelinating disease, multiple sclerosis versus neuro myelitis optica per Dr. Wilford Corner. Currently patient is hemodynamically stable. No shortness of breath.  -will admit to tele bed as inpt - highly appreciated Dr. Bess Harvest consultation, the follow-up recommendations as follows:   Recommendations:  --Check UA and chest x-ray to ensure there is no evidence of active infection  --IV Solu-Medrol 1 g x 5 days.  --PT, OT, speech therapy assessments to assess for rehab needs  --She will need follow-up with outpatient neurology-Dr. Epimenio Foot who is an MS specialist for recommendations on long-term disease modifying therapy.  --At some point, she should also receive MRI of the thoracic spine with and without contrast.  Since she just received  contrast for the brain and C-spine MRIs, this will not be done for the next 48 hours.  This can be done as outpatient as well.  --We will defer the need for the spinal tap to the outpatient neurologist  --Check neuromyelitis optica antibodies.  --Check for ANA, double-stranded DNA, ACE levels, B12 levels to evaluate for common mimics of demyelinating disease.   Tobacco abuse: -Did counseling about importance of quitting smoking -Nicotine patch  Depression: Stable, no suicidal or homicidal ideations. -Continue home medications: Cymbalta   DVT ppx: SQ Lovenox Code Status: Full code Family Communication: None at bed side. Disposition Plan:  Anticipate discharge back to previous home environment Consults called:  Dr. Wilford Corner of neuro Admission status: Inpatient/tele      Date of Service 12/09/2017    Lorretta Harp [facility] Pager [phone]  If 7PM-7AM, please contact night-coverage [email or web address] Password [facility] 12/09/2017, 12:14 AM

## 2017-12-08 NOTE — ED Provider Notes (Addendum)
[facility] EMERGENCY DEPARTMENT Provider Note   CSN: [id number] Arrival date & time: 12/08/17  1255     History   Chief Complaint No chief complaint on file.   HPI Samantha [name] is a 32 y.o. female.  HPI  Patient is a 32-year female with a history of PCOS, migraines, depression, and arthritis presenting for evaluation for diffuse muscular weakness per her PCP.  Patient was sent over from her primary care office today.  Patient reports that she has had progressive "burning" pain that started back in 2016 in bilateral lower extremities.  Subsequently, this progressed up to her palms.  Patient reports that has been progressively worsening since February 2019.  Patient reports that daily she will get a "hands stuck" sensation more on her left than her right.  Patient reports that she will also shake diffusely, and have stuttering speech when this happens.  These episodes do not necessarily occur together.  Patient also notes having erythematous rash on her anterior thorax.  Patient was seen by neurology 2016 prescribed gabapentin and had extensive rheumatologic workup at that time.  She was diagnosed with neuropathy.  Patient now on Cymbalta.  Patient reports that her primary care provider performed a CT head without contrast which was negative.  Patient also noting that she had clinically diagnosed influenza last week, but her symptoms of leg shaking, and bilateral leg pain and hand pain were proceeding with this diagnosis.  Patient denies changes in vision, slurred speech at present.   Past Medical History:  Diagnosis Date  . Arthritis   . Chicken pox   . Chlamydia 12/02/2011  . Depression   . Fracture of left leg   . Frequent headaches   . Infection    UTI  . Kidney stone    Kidney stone  . Migraines   . Neuropathy   . PCOS (polycystic ovarian syndrome)     Patient Active Problem List   Diagnosis Date Noted  . Idiopathic peripheral neuropathy 11/01/2017     Past Surgical History:  Procedure Laterality Date  . CESAREAN SECTION N/A 05/12/2013   Procedure: Primary cesarean section with delivery of baby boy at 0754.;  Surgeon: Freddrick March. Tenny Craw, MD;  Location: [facility];  Service: Obstetrics;  Laterality: N/A;  . REMOVAL OF IMPLANT Left 09/25/2015   Procedure: LEFT ANKLE REMOVAL OF DEEP  IMPLANT DEBRIDEMENT OF ANTERIOR MEDIAL ANKLE JOINT;  Surgeon: Toni Arthurs, MD;  Location: [facility];  Service: Orthopedics;  Laterality: Left;  . TIBIA FRACTURE SURGERY      OB History    Gravida Para Term Preterm AB Living   1 1 1     1    SAB TAB Ectopic Multiple Live Births           1       Home Medications    Prior to Admission medications   Medication Sig Start Date End Date Taking? Authorizing Provider  DULoxetine (CYMBALTA) 30 MG capsule Take 1 tablet daily for 1 week, then increase to 2 tablets daily. 11/04/17   Ardith Dark, MD    Family History Family History  Problem Relation Age of Onset  . Hypertension Other   . Diabetes Other   . Ulcers Mother   . GER disease Mother   . Asthma Mother   . Heart attack Mother   . Lupus Mother   . Cancer Father   . Drug abuse Father   . GER disease Sister   .  Asthma Brother   . Birth defects Maternal Grandfather   . Heart attack Maternal Grandfather   . High blood pressure Maternal Grandfather   . Stroke Maternal Grandfather   . Autism Son   . Learning disabilities Son     Social History Social History   Tobacco Use  . Smoking status: Current Every Day Smoker    Packs/day: 0.25    Years: 2.00    Pack years: 0.50    Types: Cigarettes  . Smokeless tobacco: Never Used  . Tobacco comment: quit with preg  Substance Use Topics  . Alcohol use: No    Comment: socially none since pregnancy  . Drug use: No     Allergies   Patient has no known allergies.   Review of Systems Review of Systems  Constitutional: Negative for chills and fever.  HENT: Positive for congestion  and rhinorrhea.   Respiratory: Positive for cough. Negative for chest tightness.   Cardiovascular: Negative for chest pain.  Gastrointestinal: Negative for abdominal pain, nausea and vomiting.  Genitourinary: Negative for dysuria.  Neurological: Positive for speech difficulty. Negative for syncope.  All other systems reviewed and are negative.    Physical Exam Updated Vital Signs BP (!) 147/91 (BP Location: Right Arm)   Pulse 85   Temp 97.8 F (36.6 C) (Oral)   Resp 18   Ht 5\' 5"  (1.651 m)   Wt 117 kg (258 lb)   SpO2 99%   BMI 42.93 kg/m   Physical Exam  Constitutional: She appears well-developed and well-nourished. No distress.  HENT:  Head: Normocephalic and atraumatic.  Mouth/Throat: Oropharynx is clear and moist.  Eyes: Conjunctivae and EOM are normal. Pupils are equal, round, and reactive to light.  Neck: Normal range of motion. Neck supple.  Cardiovascular: Normal rate, regular rhythm, S1 normal and S2 normal.  No murmur heard. Pulmonary/Chest: Effort normal and breath sounds normal. She has no wheezes. She has no rales.  Abdominal: Soft. She exhibits no distension. There is no tenderness. There is no guarding.  Musculoskeletal: Normal range of motion. She exhibits no edema or deformity.  Lymphadenopathy:    She has no cervical adenopathy.  Neurological: She is alert.  Mental Status:  Alert, oriented, thought content appropriate, able to give a coherent history. Speech fluent without evidence of aphasia. Able to follow 2 step commands without difficulty.  Cranial Nerves:  II:  Peripheral visual fields grossly normal, pupils equal, round, reactive to light III,IV, VI: ptosis not present, extra-ocular motions intact bilaterally  V,VII: smile symmetric, facial light touch sensation equal VIII: hearing grossly normal to voice  X: uvula elevates symmetrically  XI: bilateral shoulder shrug symmetric and strong XII: midline tongue extension without fassiculations Motor:   Normal tone. 5/5 in upper and lower extremities bilaterally including strong and equal grip strength and dorsiflexion/plantar flexion. 4+/5 grip strength on left. Sensory: Pinprick and light touch normal in all extremities.  Deep Tendon Reflexes: 1+ and symmetric in the biceps and patella. No clonus. Cerebellar: normal finger-to-nose with bilateral upper extremities Gait: normal gait and balance Stance: No pronator drift and good coordination, strength, and position sense with tapping of bilateral arms (performed in sitting position). CV: distal pulses palpable throughout   Skin: Skin is warm and dry. No rash noted. No erythema.  Psychiatric: She has a normal mood and affect. Her behavior is normal. Judgment and thought content normal.  Nursing note and vitals reviewed.    ED Treatments / Results  Labs (all labs ordered  are listed, but only abnormal results are displayed) Labs Reviewed - No data to display  EKG  EKG Interpretation None       Radiology No results found.  Procedures Procedures (including critical care time)  Medications Ordered in ED Medications - No data to display   Initial Impression / Assessment and Plan / ED Course  I have reviewed the triage vital signs and the nursing notes.  Pertinent labs & imaging results that were available during my care of the patient were reviewed by me and considered in my medical decision making (see chart for details).     Patient is nontoxic-appearing and neurologically intact.  Patient is exhibiting no unilateral weakness or focal neurologic deficits on examination.  Patient is ambulating without difficulty in the emergency department.  I do feel that patient may require additional workup neurologically outpatient including MRI, given subacute nature of symptoms.  Dr. Alphonzo Lemmings I personally spoke with the PCP over the phone, who is concerned about the acuity of the process, and discussed with patient that we will proceed with  MRI here in the emergency department.  The patient was independently evaluated by Dr. Bary Castilla who is in agreement with this assessment.  7:55 PM Spoke with Dr. Wilford Corner of neurology, who states that patient's MRI without contrast is concerning for demyelinating disease and there is possible involvement of the C-spine, so therefore will require further workup with MRI brain with contrast, and cervical spine MRI with and without.  Will call back after these results, and ultimate disposition will be made for the patient.  Further MRI with contrast confirms demyelinating lesions in the brain and cervical spine. 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  10:20 PM Patient will be admitted to hospitalist at this time.  Urinalysis, CBC, and BMet ordered. Spoke with Dr. Clyde Lundborg, who will admit the patient.  This is a shared visit with Dr. Bary Castilla. Patient was independently evaluated by this attending physician. Attending physician consulted in evaluation and admission management.   Final Clinical Impressions(s) / ED Diagnoses   Final diagnoses:  Abnormal MRI  Weakness    ED Discharge Orders    None       Delia Chimes 12/08/17 2225    Elisha Ponder, PA-C 12/08/17 2309    Abelino Derrick, MD 12/14/17 [phone]

## 2017-12-09 ENCOUNTER — Encounter (HOSPITAL_COMMUNITY): Payer: Self-pay | Admitting: *Deleted

## 2017-12-09 ENCOUNTER — Inpatient Hospital Stay (HOSPITAL_COMMUNITY): Payer: BLUE CROSS/BLUE SHIELD

## 2017-12-09 LAB — CBC
HCT: 44.2 % (ref 36.0–46.0)
Hemoglobin: 14.4 g/dL (ref 12.0–15.0)
MCH: 28.5 pg (ref 26.0–34.0)
MCHC: 32.6 g/dL (ref 30.0–36.0)
MCV: 87.4 fL (ref 78.0–100.0)
PLATELETS: 399 10*3/uL (ref 150–400)
RBC: 5.06 MIL/uL (ref 3.87–5.11)
RDW: 14.4 % (ref 11.5–15.5)
WBC: 6.8 10*3/uL (ref 4.0–10.5)

## 2017-12-09 LAB — BASIC METABOLIC PANEL
Anion gap: 11 (ref 5–15)
BUN: 9 mg/dL (ref 6–20)
CALCIUM: 8.9 mg/dL (ref 8.9–10.3)
CO2: 23 mmol/L (ref 22–32)
CREATININE: 0.93 mg/dL (ref 0.44–1.00)
Chloride: 103 mmol/L (ref 101–111)
GFR calc non Af Amer: >60 mL/min (ref >60)
GLUCOSE: 145 mg/dL — AB (ref 65–99)
Potassium: 3.8 mmol/L (ref 3.5–5.1)
Sodium: 137 mmol/L (ref 135–145)

## 2017-12-09 LAB — VITAMIN B12: Vitamin B-12: 537 pg/mL (ref 180–914)

## 2017-12-09 LAB — HIV ANTIBODY (ROUTINE TESTING W REFLEX): HIV SCREEN 4TH GENERATION: NONREACTIVE

## 2017-12-09 MED ORDER — HYDROCODONE-ACETAMINOPHEN 5-325 MG PO TABS
1.0000 | ORAL_TABLET | Freq: Four times a day (QID) | ORAL | Status: DC | PRN
  Administered 2017-12-09 – 2017-12-12 (×9): 2 via ORAL
  Filled 2017-12-09 (×9): qty 2

## 2017-12-09 NOTE — Progress Notes (Signed)
Subjective: No significant change  Exam: Vitals:   12/09/17 1128 12/09/17 1602  BP: (!) 144/81 131/72  Pulse: (!) 102 85  Resp: 20 20  Temp: (!) 97.5 F (36.4 C) 97.8 F (36.6 C)  SpO2: 97% 96%   Gen: In bed, NAD Resp: non-labored breathing, no acute distress Abd: soft, nt  Neuro: MS: Awake, alert, interactive and appropriate CN: Pupils equal round and reactive, no APD, face symmetric Motor: Very mild 5-/5 weakness in the right upper extremity  Pertinent Labs: Pending: Drue Novel, ENA, Ace B12 [id number]  Impression: 32 year old female with a history of multiple clinical episodes concerning for multiple sclerosis now with enhancing lesions as well as nonenhancing lesions and clinical symptoms.  I strongly suspect multiple sclerosis, however mimics are being assessed for with laboratory studies.  Recommendations: 1) Solu-Medrol 1 g x5 doses total. 2) PT OT  Ritta Slot, MD [facility] [phone]  If 7pm- 7am, please page neurology on call as listed in [facility].

## 2017-12-09 NOTE — Evaluation (Signed)
Physical Therapy Evaluation Patient Details Name: Samantha [name] MRN: [id number] DOB: 1986/03/30 Today's Date: 12/09/2017   History of Present Illness  Pt is a 32 y.o. female with medical history significant of depression, tobacco abuse, peripheral neuropathy, migraine headaches, who presents with diffuse weakness and numbness in all extremities.  MRI of brain and c spin with contrast showed enhancing lesions in the cervical spinal cord extending from C1-C4, almost into longitudinally extensive transverse myelitis, consistent with neuromyelitis optica.   Clinical Impression  Patient presents with decreased balance, strength and high fall risk per Sharlene Motts balance assessment (41/56, scores < 45 demonstrate fall risk).  Did not implement assistive device at this time as pt relates much of current pain is in hands and unable to hold a cane.  Feel she is safe with current compensations with gait for ambulation in home/community without device.  Discussed need for shower chair and utilizing safe technique for mobility.  Will follow up during acute stay for further education as diagnosis is evolving.  No current follow up recommendations due to feel she is currently safe and would recommend discuss with outpatient neurologist once a diagnosis is made.      Follow Up Recommendations No PT follow up    Equipment Recommendations  None recommended by PT    Recommendations for Other Services       Precautions / Restrictions Precautions Precautions: Fall Restrictions Weight Bearing Restrictions: No      Mobility  Bed Mobility Overal bed mobility: Modified Independent                Transfers Overall transfer level: Needs assistance Equipment used: None Transfers: Sit to/from Stand Sit to Stand: Modified independent (Device/Increase time)         General transfer comment: supervision for safety, no physical assist needed   Ambulation/Gait Ambulation/Gait assistance:  Supervision Ambulation Distance (Feet): 200 Feet Assistive device: None Gait Pattern/deviations: Step-through pattern;Wide base of support;Decreased stride length     General Gait Details: slower pace, but stable, wide BOS with hip external rotation   Stairs Stairs: Yes Stairs assistance: Supervision Stair Management: Alternating pattern;Forwards;One rail Right Number of Stairs: 2 General stair comments: reports has half wall on right uses as a rail at home  Wheelchair Mobility    Modified Rankin (Stroke Patients Only)       Balance Overall balance assessment: Needs assistance Sitting-balance support: No upper extremity supported;Feet supported Sitting balance-Leahy Scale: Good     Standing balance support: No upper extremity supported Standing balance-Leahy Scale: Fair                 High Level Balance Comments: able to step over boxes in hallway without UE support Standardized Balance Assessment Standardized Balance Assessment : Berg Balance Test Berg Balance Test Sit to Stand: Able to stand  independently using hands Standing Unsupported: Able to stand 2 minutes with supervision Sitting with Back Unsupported but Feet Supported on Floor or Stool: Able to sit safely and securely 2 minutes Stand to Sit: Sits safely with minimal use of hands Transfers: Able to transfer safely, definite need of hands Standing Unsupported with Eyes Closed: Able to stand 10 seconds safely Standing Ubsupported with Feet Together: Needs help to attain position but able to stand for 30 seconds with feet together From Standing, Reach Forward with Outstretched Arm: Can reach forward >12 cm safely (5") From Standing Position, Pick up Object from Floor: Able to pick up shoe safely and easily From Standing Position, Turn  to Look Behind Over each Shoulder: Looks behind from both sides and weight shifts well Turn 360 Degrees: Able to turn 360 degrees safely but slowly Standing Unsupported,  Alternately Place Feet on Step/Stool: Able to complete >2 steps/needs minimal assist Standing Unsupported, One Foot in Front: Able to plae foot ahead of the other independently and hold 30 seconds Standing on One Leg: Able to lift leg independently and hold equal to or more than 3 seconds Total Score: 41         Pertinent Vitals/Pain Pain Assessment: Faces Faces Pain Scale: Hurts little more Pain Location: bil feet and hands  Pain Descriptors / Indicators: Pins and needles Pain Intervention(s): Monitored during session    Home Living Family/patient expects to be discharged to:: Private residence Living Arrangements: Spouse/significant other;Children;Other relatives(16 y/o sister, 73 y/o son) Available Help at Discharge: Family Type of Home: House Home Access: Stairs to enter   Secretary/administrator of Steps: 3 Home Layout: One level Home Equipment: Crutches      Prior Function Level of Independence: Independent         Comments: pt driving, 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; pt reports spouse is home intermittently as he works as a Chief of Staff: Right    Extremity/Trunk Assessment   Upper Extremity Assessment Upper Extremity Assessment: LUE deficits/detail;RUE deficits/detail RUE Deficits / Details: increased effort for AROM though WFL  RUE Sensation: decreased light touch RUE Coordination: decreased fine motor LUE Deficits / Details: increased effort for AROM though WFL  LUE Sensation: decreased light touch LUE Coordination: decreased fine motor    Lower Extremity Assessment Lower Extremity Assessment: LLE deficits/detail;RLE deficits/detail RLE Deficits / Details: AROM, strength grossly WFL, though reports feels they shake at times RLE Sensation: decreased light touch RLE Coordination: decreased gross motor LLE Deficits / Details: AROM, strength grossly WFL, though reports feels they shake at times LLE Sensation:  decreased light touch LLE Coordination: decreased gross motor       Communication   Communication: No difficulties  Cognition Arousal/Alertness: Awake/alert Behavior During Therapy: WFL for tasks assessed/performed Overall Cognitive Status: Within Functional Limits for tasks assessed                                 General Comments: pt reports intermittently experiencing word finding difficulties       General Comments General comments (skin integrity, edema, etc.): discussed fall risk and need for nonskid footwear, lighting, storing items used daily well within reach, etc. Also discussed shower chair    Exercises Other Exercises Other Exercises: issued pt fine motor coordination, theraputty, and tendon gliding exercise handout with pt return demonstrating and verbalizing understanding of instructions, pt alternating demonstrating between L/R hand. Educated pt to initially complete exercises within tolerance (i.e. 5 reps each) and build up as able    Assessment/Plan    PT Assessment Patient needs continued PT services  PT Problem List Decreased mobility;Decreased strength;Decreased coordination;Decreased knowledge of precautions;Decreased balance       PT Treatment Interventions Gait training;Therapeutic exercise;Patient/family education;Stair training;Balance training;Functional mobility training;Therapeutic activities    PT Goals (Current goals can be found in the Care Plan section)  Acute Rehab PT Goals Patient Stated Goal: to return to work Time For Goal Achievement: 12/16/17 Potential to Achieve Goals: Good    Frequency Min 3X/week   Barriers to discharge  Co-evaluation               AM-PAC PT "6 Clicks" Daily Activity  Outcome Measure Difficulty turning over in bed (including adjusting bedclothes, sheets and blankets)?: None Difficulty moving from lying on back to sitting on the side of the bed? : A Little Difficulty sitting down on and  standing up from a chair with arms (e.g., wheelchair, bedside commode, etc,.)?: A Little Help needed moving to and from a bed to chair (including a wheelchair)?: None Help needed walking in hospital room?: None Help needed climbing 3-5 steps with a railing? : None 6 Click Score: 22    End of Session Equipment Utilized During Treatment: Gait belt Activity Tolerance: Patient tolerated treatment well Patient left: in bed;with call bell/phone within reach;with family/visitor present   PT Visit Diagnosis: Other abnormalities of gait and mobility (R26.89);Other symptoms and signs involving the nervous system (R29.898)    Time: 8115-7262 PT Time Calculation (min) (ACUTE ONLY): 24 min   Charges:   PT Evaluation $PT Eval Low Complexity: 1 Low PT Treatments $Neuromuscular Re-education: 8-22 mins   PT G CodesSheran Lawless, [place] [phone] 12/09/2017   Elray Mcgregor 12/09/2017, 11:57 AM

## 2017-12-09 NOTE — Progress Notes (Signed)
PROGRESS NOTE    Samantha [name]  ZOX:[id number] DOB: 1986-05-28 DOA: 12/08/2017 PCP: Ardith Dark, MD   Brief Narrative: 32 year old with past medical history relevant for depression, peripheral neuropathy, coming in with diffuse weakness and found to have possible demyelinating process of the brain and C-spine concerning for multiple sclerosis versus neuromyelitis optica.  Assessment & Plan:   Principal Problem:   Demyelinating disease (HCC) Active Problems:   Tobacco abuse   Depression   #) Demyelinating process: Patient's symptoms have not improved dramatically.  She continues on significant amounts of steroids. -Continue 1000 prednisolone daily, day 1 of 5 - Physical therapy and occupational therapy consult - Follow-up neuromyelitis optica antibodies, ACE levels, B12 levels, ANA, double-stranded DNA [facility]line-patient will need MRI of thoracic spine as well as lumbar puncture which can be done as an outpatient -Patient will need outpatient neurology with Dr. Epimenio Foot who is a multiple sclerosis specialist [facility]line-neurology following appreciate recommendations  #) Depression: -Continue duloxetine 30 mg twice daily  Fluids: Tolerating p.o. Electrodes: Monitor and supplement Nutrition: Regular diet  Prophylaxis: enoxparin 40 mg  Disposition: Pending completion of IV steroids  Full code       Consultants:   Neurology  Procedures: (Don't include imaging studies which can be auto populated. Include things that cannot be auto populated i.e. Echo, Carotid and venous dopplers, Foley, Bipap, HD, tubes/drains, wound vac, central lines etc)  None  Antimicrobials: (specify start and planned stop date. Auto populated tables are space occupying and do not give end dates)  None   Subjective: Patient reports she is feeling well today.  She reports that her weakness is still quite the same however she is able to get up out of bed.  She denies any chest pain, nausea, vomiting,  diarrhea, numbness, tingling.  She denies any new weakness.  Objective: Vitals:   12/09/17 0000 12/09/17 0400 12/09/17 0830 12/09/17 1128  BP: (!) 131/100 128/75 139/82 (!) 144/81  Pulse: 66 69 97 (!) 102  Resp: 20 18 20 20   Temp: (!) 97.5 F (36.4 C) 98 F (36.7 C) 97.6 F (36.4 C) (!) 97.5 F (36.4 C)  TempSrc: Oral Oral Oral Oral  SpO2: 99% 97% 97% 97%  Weight: 115.2 kg (253 lb 15.5 oz)     Height: 5\' 5"  (1.651 m)       Intake/Output Summary (Last 24 hours) at 12/09/2017 1145 Last data filed at 12/09/2017 0831 Gross per 24 hour  Intake 178 ml  Output -  Net 178 ml   Filed Weights   12/08/17 1301 12/09/17 0000  Weight: 117 kg (258 lb) 115.2 kg (253 lb 15.5 oz)    Examination:  General exam: Appears calm and comfortable  Respiratory system: Clear to auscultation. Respiratory effort normal. Cardiovascular system: Regular rate and rhythm, no murmurs Gastrointestinal system: Abdomen is nondistended, soft and nontender. No organomegaly or masses felt. Normal bowel sounds heard. Central nervous system: Alert and oriented.  Diffusely weak more prominent in distal extremities and proximal.  Intact sensation throughout Extremities: Trace lower extremity edema Skin: No rashes visible skin Psychiatry: Judgement and insight appear normal. Mood & affect appropriate.     Data Reviewed: I have personally reviewed following labs and imaging studies  CBC: Recent Labs  Lab 12/08/17 2240 12/09/17 0346  WBC 8.1 6.8  NEUTROABS 4.9  --   HGB 14.3 14.4  HCT 42.7 44.2  MCV 86.3 87.4  PLT 388 399   Basic Metabolic Panel: Recent Labs  Lab 12/08/17 2240  12/09/17 0346  NA 137 137  K 3.7 3.8  CL 103 103  CO2 23 23  GLUCOSE 89 145*  BUN 5* 9  CREATININE 0.78 0.93  CALCIUM 9.0 8.9   GFR: Estimated Creatinine Clearance: 111.1 mL/min (by C-G formula based on SCr of 0.93 mg/dL). Liver Function Tests: No results for input(s): AST, ALT, ALKPHOS, BILITOT, PROT, ALBUMIN in the last  168 hours. No results for input(s): LIPASE, AMYLASE in the last 168 hours. No results for input(s): AMMONIA in the last 168 hours. Coagulation Profile: No results for input(s): INR, PROTIME in the last 168 hours. Cardiac Enzymes: No results for input(s): CKTOTAL, CKMB, CKMBINDEX, TROPONINI in the last 168 hours. BNP (last 3 results) No results for input(s): PROBNP in the last 8760 hours. HbA1C: No results for input(s): HGBA1C in the last 72 hours. CBG: No results for input(s): GLUCAP in the last 168 hours. Lipid Profile: No results for input(s): CHOL, HDL, LDLCALC, TRIG, CHOLHDL, LDLDIRECT in the last 72 hours. Thyroid Function Tests: No results for input(s): TSH, T4TOTAL, FREET4, T3FREE, THYROIDAB in the last 72 hours. Anemia Panel: Recent Labs    12/08/17 2240  VITAMINB12 537   Sepsis Labs: No results for input(s): PROCALCITON, LATICACIDVEN in the last 168 hours.  No results found for this or any previous visit (from the past 240 hour(s)).       Radiology Studies: Mr Brain Wo Contrast  Addendum Date: 12/08/2017   ADDENDUM REPORT: 12/08/2017 20:00 ADDENDUM: Additionally, on the sagittal FLAIR sequence, hyperintense T2-weighted signal lesions can be seen in the C2 and C3 levels and, possibly, also at the level of the C4-5 disc. This increases the likelihood of demyelinating disease, with differential considerations including multiple sclerosis or neuromyelitis optica spectrum disorders. In addition to the postcontrast imaging of the brain previously recommended, MRI of the cervical spine with and without contrast is also suggested. I discussed this with Dr. Wilford Corner at 7:59 p.m. on 12/08/2017. Electronically Signed   By: Deatra Robinson M.D.   On: 12/08/2017 20:00   Result Date: 12/08/2017 CLINICAL DATA:  Left hand weakness EXAM: MRI HEAD WITHOUT CONTRAST TECHNIQUE: Multiplanar, multiecho pulse sequences of the brain and surrounding structures were obtained without intravenous contrast.  COMPARISON:  Head CT 12/05/2017 FINDINGS: Brain: The midline structures are normal. There is no acute infarct or acute hemorrhage. No mass lesion, hydrocephalus, dural abnormality or extra-axial collection. There are multiple lesions of the periventricular and deep white matter. Some of these lesions are oriented perpendicularly to the long axis of the lateral ventricles. There is corresponding low T1-weighted signal for multiple lesions. No age-advanced or lobar predominant atrophy. No chronic microhemorrhage or superficial siderosis. Vascular: Major intracranial arterial and venous sinus flow voids are preserved. Skull and upper cervical spine: The visualized skull base, calvarium, upper cervical spine and extracranial soft tissues are normal. Sinuses/Orbits: No fluid levels or advanced mucosal thickening. No mastoid or middle ear effusion. Normal orbits. IMPRESSION: 1. Multiple deep and periventricular white matter lesions in a pattern that is nonspecific but concerning for demyelinating disease. CSF sampling is recommended. Additionally, postcontrast imaging might be helpful to assess for active demyelination or inflammation. 2. No acute ischemia. Electronically Signed: By: Deatra Robinson M.D. On: 12/08/2017 19:17   Mr Brain W Contrast  Result Date: 12/08/2017 CLINICAL DATA:  Suspected demyelinating disease. EXAM: MRI HEAD WITH CONTRAST MRI CERVICAL SPINE WITHOUT AND WITH CONTRAST TECHNIQUE: Multiplanar, multiecho pulse sequences of the cervical spine, to include the craniocervical junction and cervicothoracic junction,  were obtained without and with intravenous contrast. Multiplanar, multi echo pulse sequences of the brain and surrounding structures were obtained after the administration of intravenous contrast. CONTRAST:  20mL MULTIHANCE GADOBENATE DIMEGLUMINE 529 MG/ML IV SOLN COMPARISON:  None. FINDINGS: MRI HEAD FINDINGS Postcontrast imaging only was obtained, as a supplement to the earlier performed  noncontrast MRI of the brain. There is no parenchymal contrast enhancement. There is a small developmental venous anomaly in the right frontal lobe. No optic nerve enhancement. MRI CERVICAL SPINE FINDINGS Alignment: Physiologic. Vertebrae: No fracture, evidence of discitis, or bone lesion. Cord: There are hyperintense T2-weighted signal lesions within the spinal cord at the C2-C5 levels. The lesions predominantly involve the central spinal cord. There is sparing of the right dorsal cord at C2 and C3 levels. The left dorsal cord is spared at the C4 and C5 levels. There is mild contrast enhancement at the C2 and C3 levels. Posterior Fossa, vertebral arteries, paraspinal tissues: Visualized posterior fossa is normal. Vertebral artery flow voids are preserved. No prevertebral soft tissue swelling. Disc levels: No disc herniation, spinal canal stenosis or neural foraminal stenosis. IMPRESSION: 1. Multiple large brain tense T2-weighted signal lesions within the cervical spinal cord, most suggestive of demyelinating disease. Contrast enhancement within the most superior lesions is consistent with active demyelination. 2. No abnormal contrast enhancement within the brain. 3. In conjunction with the earlier performed noncontrast brain MRI, the findings altogether remain most suggestive of demyelinating disease, likely acute on chronic. Multiple sclerosis remains the primary consideration and neuromyelitis optica spectrum disorders the most likely alternative. Other demyelinating diseases, such as acute disseminated encephalomyelitis, are less likely. Electronically Signed   By: Deatra Robinson M.D.   On: 12/08/2017 21:48   Mr Cervical Spine W Wo Contrast  Result Date: 12/08/2017 CLINICAL DATA:  Suspected demyelinating disease. EXAM: MRI HEAD WITH CONTRAST MRI CERVICAL SPINE WITHOUT AND WITH CONTRAST TECHNIQUE: Multiplanar, multiecho pulse sequences of the cervical spine, to include the craniocervical junction and  cervicothoracic junction, were obtained without and with intravenous contrast. Multiplanar, multi echo pulse sequences of the brain and surrounding structures were obtained after the administration of intravenous contrast. CONTRAST:  20mL MULTIHANCE GADOBENATE DIMEGLUMINE 529 MG/ML IV SOLN COMPARISON:  None. FINDINGS: MRI HEAD FINDINGS Postcontrast imaging only was obtained, as a supplement to the earlier performed noncontrast MRI of the brain. There is no parenchymal contrast enhancement. There is a small developmental venous anomaly in the right frontal lobe. No optic nerve enhancement. MRI CERVICAL SPINE FINDINGS Alignment: Physiologic. Vertebrae: No fracture, evidence of discitis, or bone lesion. Cord: There are hyperintense T2-weighted signal lesions within the spinal cord at the C2-C5 levels. The lesions predominantly involve the central spinal cord. There is sparing of the right dorsal cord at C2 and C3 levels. The left dorsal cord is spared at the C4 and C5 levels. There is mild contrast enhancement at the C2 and C3 levels. Posterior Fossa, vertebral arteries, paraspinal tissues: Visualized posterior fossa is normal. Vertebral artery flow voids are preserved. No prevertebral soft tissue swelling. Disc levels: No disc herniation, spinal canal stenosis or neural foraminal stenosis. IMPRESSION: 1. Multiple large brain tense T2-weighted signal lesions within the cervical spinal cord, most suggestive of demyelinating disease. Contrast enhancement within the most superior lesions is consistent with active demyelination. 2. No abnormal contrast enhancement within the brain. 3. In conjunction with the earlier performed noncontrast brain MRI, the findings altogether remain most suggestive of demyelinating disease, likely acute on chronic. Multiple sclerosis remains the primary consideration and  neuromyelitis optica spectrum disorders the most likely alternative. Other demyelinating diseases, such as acute  disseminated encephalomyelitis, are less likely. Electronically Signed   By: Deatra Robinson M.D.   On: 12/08/2017 21:48   Dg Chest Port 1 View  Result Date: 12/09/2017 CLINICAL DATA:  Demyelinating disease EXAM: PORTABLE CHEST 1 VIEW COMPARISON:  08/06/2014 FINDINGS: The heart size and mediastinal contours are within normal limits. Both lungs are clear. The visualized skeletal structures are unremarkable. IMPRESSION: No active disease. Electronically Signed   By: Jasmine Pang M.D.   On: 12/09/2017 03:29        Scheduled Meds: . DULoxetine  30 mg Oral BID  . enoxaparin (LOVENOX) injection  40 mg Subcutaneous Q24H   Continuous Infusions: . methylPREDNISolone (SOLU-MEDROL) injection Stopped (12/09/17 0404)     LOS: 1 day    Time spent: 30    Delaine Lame, MD [facility]  If 7PM-7AM, please contact night-coverage [email or web address] Password [email or web address] 12/09/2017, 11:45 AM

## 2017-12-09 NOTE — Evaluation (Signed)
Occupational Therapy Evaluation Patient Details Name: Samantha [name] MRN: [id number] DOB: 10-18-1985 Today's Date: 12/09/2017    History of Present Illness Pt is a 32 y.o. female with medical history significant of depression, tobacco abuse, peripheral neuropathy, migraine headaches, who presents with diffuse weakness and numbness in all extremities.   Clinical Impression   This 32 y/o F presents with the above. At baseline pt is independent with ADLs, iADLs and functional mobility. Pt completing room and hallway level functional mobility without AD and with MinGuard-supervision throughout this session; currently requires minguard-minA for LB ADLs with pt presenting with decreased UE strength, coordination, and sensation. Pt will benefit from continued acute OT services to maximize her overall UE functional use, safety and independence with ADLs and functional mobility prior to discharge home.     Follow Up Recommendations  Supervision - Intermittent;Other (comment)(will re-assess after further work-up completed regarding pt's prognosis)    Equipment Recommendations  Tub/shower seat           Precautions / Restrictions Precautions Precautions: None Restrictions Weight Bearing Restrictions: No      Mobility Bed Mobility Overal bed mobility: Modified Independent                Transfers Overall transfer level: Needs assistance Equipment used: None Transfers: Sit to/from Stand Sit to Stand: Supervision         General transfer comment: supervision for safety, no physical assist needed     Balance Overall balance assessment: Needs assistance Sitting-balance support: No upper extremity supported;Feet supported Sitting balance-Leahy Scale: Good     Standing balance support: No upper extremity supported;During functional activity Standing balance-Leahy Scale: Fair                   Standardized Balance Assessment Standardized Balance Assessment : Berg  Balance Test Berg Balance Test Sit to Stand: Able to stand  independently using hands Standing Unsupported: Able to stand 2 minutes with supervision Sitting with Back Unsupported but Feet Supported on Floor or Stool: Able to sit safely and securely 2 minutes Stand to Sit: Sits safely with minimal use of hands Transfers: Able to transfer safely, definite need of hands Standing Unsupported with Eyes Closed: Able to stand 10 seconds safely Standing Ubsupported with Feet Together: Needs help to attain position but able to stand for 30 seconds with feet together From Standing, Reach Forward with Outstretched Arm: Can reach forward >12 cm safely (5") From Standing Position, Pick up Object from Floor: Able to pick up shoe safely and easily From Standing Position, Turn to Look Behind Over each Shoulder: Looks behind from both sides and weight shifts well Turn 360 Degrees: Able to turn 360 degrees safely but slowly Standing Unsupported, Alternately Place Feet on Step/Stool: Able to complete >2 steps/needs minimal assist Standing Unsupported, One Foot in Front: Able to plae foot ahead of the other independently and hold 30 seconds Standing on One Leg: Able to lift leg independently and hold equal to or more than 3 seconds Total Score: 41       ADL either performed or assessed with clinical judgement   ADL Overall ADL's : Needs assistance/impaired Eating/Feeding: Modified independent;Sitting   Grooming: Min guard;Sitting;Wash/dry face   Upper Body Bathing: Supervision/ safety;Sitting   Lower Body Bathing: Min guard;Sit to/from stand   Upper Body Dressing : Sitting   Lower Body Dressing: Min guard;Sit to/from stand Lower Body Dressing Details (indicate cue type and reason): pt using EOB to prop LE onto to reach  feet for LB dressing, demonstrating as this is how she completes task at home  Toilet Transfer: Min guard;Ambulation;Regular Teacher, adult education Details (indicate cue type and  reason): simulated in transfer to/from EOB Toileting- Clothing Manipulation and Hygiene: Sit to/from stand;Min guard       Functional mobility during ADLs: Min guard General ADL Comments: pt completing room and hallway level functional mobility, standing grooming ADLs with minGuard throughout; UE HEP handouts provided and reviewed      Vision   Vision Assessment?: No apparent visual deficits Additional Comments: appears WFL during session and functional task completion; pt able to read print on HEP without difficulty, per chart review pt has reported blurred vision in the past                 Pertinent Vitals/Pain Pain Assessment: Faces Faces Pain Scale: Hurts little more Pain Location: bil feet and hands  Pain Descriptors / Indicators: Pins and needles Pain Intervention(s): Monitored during session     Hand Dominance Right   Extremity/Trunk Assessment Upper Extremity Assessment Upper Extremity Assessment: LUE deficits/detail;RUE deficits/detail RUE Deficits / Details: increased effort for AROM though WFL  RUE Sensation: decreased light touch RUE Coordination: decreased fine motor LUE Deficits / Details: increased effort for AROM though WFL  LUE Sensation: decreased light touch LUE Coordination: decreased fine motor   Lower Extremity Assessment Lower Extremity Assessment: Defer to PT evaluation       Communication Communication Communication: No difficulties   Cognition Arousal/Alertness: Awake/alert Behavior During Therapy: WFL for tasks assessed/performed Overall Cognitive Status: Within Functional Limits for tasks assessed                                 General Comments: pt reports intermittently experiencing word finding difficulties    General Comments       Exercises Other Exercises Other Exercises: issued pt fine motor coordination, theraputty, and tendon gliding exercise handout with pt return demonstrating and verbalizing understanding  of instructions, pt alternating demonstrating between L/R hand. Educated pt to initially complete exercises within tolerance (i.e. 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; pt reports spouse is home intermittently as he works as a Product manager List: Decreased activity tolerance;Decreased range of motion;Impaired sensation;Impaired UE functional use;Decreased coordination      OT Treatment/Interventions: Self-care/ADL training;DME and/or AE instruction;Therapeutic activities;Balance training;Therapeutic exercise;Energy conservation;Patient/family education    OT Goals(Current goals can be found in the care plan section) Acute Rehab OT Goals Patient Stated Goal: maintain independence  OT Goal Formulation: With patient Time For Goal Achievement: 12/23/17 Potential to Achieve Goals: Good  OT Frequency: Min 2X/week  AM-PAC PT "6 Clicks" Daily Activity     Outcome Measure Help from another person eating meals?: None Help from another person taking care of personal grooming?: None Help from another person toileting, which includes using toliet, bedpan, or urinal?: None Help from another person bathing (including washing, rinsing, drying)?: A Little Help from another person to put on and taking off regular upper body clothing?: None Help from another person to put on and  taking off regular lower body clothing?: A Little 6 Click Score: 22   End of Session Equipment Utilized During Treatment: Gait belt Nurse Communication: Mobility status  Activity Tolerance: Patient tolerated treatment well Patient left: with call bell/phone within reach;with family/visitor present;Other (comment)(sitting EOB )  OT Visit Diagnosis: Other abnormalities of gait and mobility (R26.89);Other symptoms and signs involving the nervous system (R29.898)                Time: 1003-1036 OT Time Calculation (min): 33 min Charges:  OT General Charges $OT Visit: 1 Visit OT Evaluation $OT Eval Moderate Complexity: 1 Mod OT Treatments $Self Care/Home Management : 8-22 mins G-Codes:     Marcy Siren, OT Pager [phone] 12/09/2017   Orlando Penner 12/09/2017, 11:44 AM

## 2017-12-10 LAB — EXTRACTABLE NUCLEAR ANTIGEN ANTIBODY
ENA SM Ab Ser-aCnc: <0.2 AI (ref 0.0–0.9)
Ribonucleic Protein: <0.2 AI (ref 0.0–0.9)
SSA (Ro) (ENA) Antibody, IgG: <0.2 AI (ref 0.0–0.9)
SSB (La) (ENA) Antibody, IgG: <0.2 AI (ref 0.0–0.9)
ds DNA Ab: <1 IU/mL (ref 0–9)

## 2017-12-10 LAB — ANGIOTENSIN CONVERTING ENZYME: ANGIOTENSIN-CONVERTING ENZYME: 30 U/L (ref 14–82)

## 2017-12-10 LAB — ANTI-JO 1 ANTIBODY, IGG

## 2017-12-10 NOTE — Progress Notes (Signed)
Neurology Progress Note  S:// Husband at bedside. No new events reported overnight. Patient voice no new concerns. Admission symptoms slowly improving. Working with PT/OT. Denies andy H/A or visual changes, any new weakness or numbness on exam today. Patient says the prednisone is making her feel "jittery".  O:// Exam: Vitals:   12/10/17 0400 12/10/17 0820  BP: (!) 141/84 136/78  Pulse: 72 70  Resp: 18 18  Temp: 98 F (36.7 C) 97.7 F (36.5 C)  SpO2: 99% 98%   Gen: In bed, NAD Resp: non-labored breathing, no acute distress Abd: soft, nt  Neuro: MS: Awake, alert, interactive and appropriate. Oriented x 3 CN: Pupils equal round and reactive, no APD, face symmetric Motor: Very mild 5-/5 weakness in the right upper extremity - improving  Pertinent Labs: Pending: [place], J1, ENA, Neuromyelitis optica  A:// Impression: 32 year old female with a history of multiple clinical episodes concerning for multiple sclerosis now with enhancing lesions as well as nonenhancing lesions and clinical symptoms.  Strongly suspect multiple sclerosis, however mimics are being assessed for with laboratory studies.  Symptoms slowly improving on exam this morning.   P:// Recommendations: 1) Continue IV Solu-Medrol 1 g x5 doses total. 2) PT OT 3) Follow up with outpatient [facility] Neurology Dr Epimenio Foot in 3 weeks  Beryl Meager, ANP-C [facility] Team 12/10/2017 9:44 AM  If 7pm- 7am, please page neurology on call as listed in AMION.

## 2017-12-10 NOTE — Progress Notes (Signed)
PROGRESS NOTE    Samantha [name]  RUE:[id number] DOB: July 15, 1986 DOA: 12/08/2017 PCP: Ardith Dark, MD   Brief Narrative: 32 year old with past medical history relevant for depression, peripheral neuropathy, coming in with diffuse weakness and found to have possible demyelinating process of the brain and C-spine concerning for multiple sclerosis versus neuromyelitis optica.  Assessment & Plan:   Principal Problem:   Demyelinating disease (HCC) Active Problems:   Tobacco abuse   Depression   #) Demyelinating process: Patient's symptoms have not improved dramatically.  She continues on significant amounts of steroids. -Continue 1000 prednisolone daily, day 2 of 5 - Physical therapy Does not have any outpatient recommendations at this time -Occupational therapy recommends a tub and shower seat - Follow-up neuromyelitis optica antibodies,double-stranded DNA, anti-Jo 1 antibody, ENA antibody -ACE levels normal -ANA negative -B12 levels 537 normal -patient will need MRI of thoracic spine as well as lumbar puncture which can be done as an outpatient -Patient will need outpatient neurology with Dr. Epimenio Foot who is a multiple sclerosis specialist  -neurology following appreciate recommendations  #) Depression: -Continue duloxetine 30 mg twice daily  Fluids: Tolerating p.o. Electrodes: Monitor and supplement Nutrition: Regular diet  Prophylaxis: enoxparin 40 mg  Disposition: Pending completion of IV steroids  Full code       Consultants:   Neurology  Procedures: (Don't include imaging studies which can be auto populated. Include things that cannot be auto populated i.e. Echo, Carotid and venous dopplers, Foley, Bipap, HD, tubes/drains, wound vac, central lines etc)  None  Antimicrobials: (specify start and planned stop date. Auto populated tables are space occupying and do not give end dates)  None   Subjective: Patient reports she is feeling well today.  She does  not have any complaints.  Objective: Vitals:   12/09/17 2000 12/10/17 0000 12/10/17 0400 12/10/17 0820  BP: 136/81 (!) 143/76 (!) 141/84 136/78  Pulse: 85 72 72 70  Resp: 20 18 18 18   Temp: 98.4 F (36.9 C) 98 F (36.7 C) 98 F (36.7 C) 97.7 F (36.5 C)  TempSrc: Oral Oral Oral Oral  SpO2: 99% 96% 99% 98%  Weight:      Height:        Intake/Output Summary (Last 24 hours) at 12/10/2017 0942 Last data filed at 12/09/2017 1330 Gross per 24 hour  Intake 240 ml  Output -  Net 240 ml   Filed Weights   12/08/17 1301 12/09/17 0000  Weight: 117 kg (258 lb) 115.2 kg (253 lb 15.5 oz)    Examination:  General exam: Appears calm and comfortable  Respiratory system: Clear to auscultation. Respiratory effort normal. Cardiovascular system: Regular rate and rhythm, no murmurs Gastrointestinal system: Abdomen is nondistended, soft and nontender. No organomegaly or masses felt. Normal bowel sounds heard. Central nervous system: Alert and oriented.  Diffusely weak more prominent in distal extremities and proximal.  Intact sensation throughout Extremities: Trace lower extremity edema Skin: No rashes visible skin Psychiatry: Judgement and insight appear normal. Mood & affect appropriate.     Data Reviewed: I have personally reviewed following labs and imaging studies  CBC: Recent Labs  Lab 12/08/17 2240 12/09/17 0346  WBC 8.1 6.8  NEUTROABS 4.9  --   HGB 14.3 14.4  HCT 42.7 44.2  MCV 86.3 87.4  PLT 388 399   Basic Metabolic Panel: Recent Labs  Lab 12/08/17 2240 12/09/17 0346  NA 137 137  K 3.7 3.8  CL 103 103  CO2 23 23  GLUCOSE  89 145*  BUN 5* 9  CREATININE 0.78 0.93  CALCIUM 9.0 8.9   GFR: Estimated Creatinine Clearance: 111.1 mL/min (by C-G formula based on SCr of 0.93 mg/dL). Liver Function Tests: No results for input(s): AST, ALT, ALKPHOS, BILITOT, PROT, ALBUMIN in the last 168 hours. No results for input(s): LIPASE, AMYLASE in the last 168 hours. No results  for input(s): AMMONIA in the last 168 hours. Coagulation Profile: No results for input(s): INR, PROTIME in the last 168 hours. Cardiac Enzymes: No results for input(s): CKTOTAL, CKMB, CKMBINDEX, TROPONINI in the last 168 hours. BNP (last 3 results) No results for input(s): PROBNP in the last 8760 hours. HbA1C: No results for input(s): HGBA1C in the last 72 hours. CBG: No results for input(s): GLUCAP in the last 168 hours. Lipid Profile: No results for input(s): CHOL, HDL, LDLCALC, TRIG, CHOLHDL, LDLDIRECT in the last 72 hours. Thyroid Function Tests: No results for input(s): TSH, T4TOTAL, FREET4, T3FREE, THYROIDAB in the last 72 hours. Anemia Panel: Recent Labs    12/08/17 2240  VITAMINB12 537   Sepsis Labs: No results for input(s): PROCALCITON, LATICACIDVEN in the last 168 hours.  No results found for this or any previous visit (from the past 240 hour(s)).       Radiology Studies: Mr Brain Wo Contrast  Addendum Date: 12/08/2017   ADDENDUM REPORT: 12/08/2017 20:00 ADDENDUM: Additionally, on the sagittal FLAIR sequence, hyperintense T2-weighted signal lesions can be seen in the C2 and C3 levels and, possibly, also at the level of the C4-5 disc. This increases the likelihood of demyelinating disease, with differential considerations including multiple sclerosis or neuromyelitis optica spectrum disorders. In addition to the postcontrast imaging of the brain previously recommended, MRI of the cervical spine with and without contrast is also suggested. I discussed this with Dr. Wilford Corner at 7:59 p.m. on 12/08/2017. Electronically Signed   By: Deatra Robinson M.D.   On: 12/08/2017 20:00   Result Date: 12/08/2017 CLINICAL DATA:  Left hand weakness EXAM: MRI HEAD WITHOUT CONTRAST TECHNIQUE: Multiplanar, multiecho pulse sequences of the brain and surrounding structures were obtained without intravenous contrast. COMPARISON:  Head CT 12/05/2017 FINDINGS: Brain: The midline structures are normal.  There is no acute infarct or acute hemorrhage. No mass lesion, hydrocephalus, dural abnormality or extra-axial collection. There are multiple lesions of the periventricular and deep white matter. Some of these lesions are oriented perpendicularly to the long axis of the lateral ventricles. There is corresponding low T1-weighted signal for multiple lesions. No age-advanced or lobar predominant atrophy. No chronic microhemorrhage or superficial siderosis. Vascular: Major intracranial arterial and venous sinus flow voids are preserved. Skull and upper cervical spine: The visualized skull base, calvarium, upper cervical spine and extracranial soft tissues are normal. Sinuses/Orbits: No fluid levels or advanced mucosal thickening. No mastoid or middle ear effusion. Normal orbits. IMPRESSION: 1. Multiple deep and periventricular white matter lesions in a pattern that is nonspecific but concerning for demyelinating disease. CSF sampling is recommended. Additionally, postcontrast imaging might be helpful to assess for active demyelination or inflammation. 2. No acute ischemia. Electronically Signed: By: Deatra Robinson M.D. On: 12/08/2017 19:17   Mr Brain W Contrast  Result Date: 12/08/2017 CLINICAL DATA:  Suspected demyelinating disease. EXAM: MRI HEAD WITH CONTRAST MRI CERVICAL SPINE WITHOUT AND WITH CONTRAST TECHNIQUE: Multiplanar, multiecho pulse sequences of the cervical spine, to include the craniocervical junction and cervicothoracic junction, were obtained without and with intravenous contrast. Multiplanar, multi echo pulse sequences of the brain and surrounding structures were obtained  after the administration of intravenous contrast. CONTRAST:  20mL MULTIHANCE GADOBENATE DIMEGLUMINE 529 MG/ML IV SOLN COMPARISON:  None. FINDINGS: MRI HEAD FINDINGS Postcontrast imaging only was obtained, as a supplement to the earlier performed noncontrast MRI of the brain. There is no parenchymal contrast enhancement. There is a  small developmental venous anomaly in the right frontal lobe. No optic nerve enhancement. MRI CERVICAL SPINE FINDINGS Alignment: Physiologic. Vertebrae: No fracture, evidence of discitis, or bone lesion. Cord: There are hyperintense T2-weighted signal lesions within the spinal cord at the C2-C5 levels. The lesions predominantly involve the central spinal cord. There is sparing of the right dorsal cord at C2 and C3 levels. The left dorsal cord is spared at the C4 and C5 levels. There is mild contrast enhancement at the C2 and C3 levels. Posterior Fossa, vertebral arteries, paraspinal tissues: Visualized posterior fossa is normal. Vertebral artery flow voids are preserved. No prevertebral soft tissue swelling. Disc levels: No disc herniation, spinal canal stenosis or neural foraminal stenosis. IMPRESSION: 1. Multiple large brain tense T2-weighted signal lesions within the cervical spinal cord, most suggestive of demyelinating disease. Contrast enhancement within the most superior lesions is consistent with active demyelination. 2. No abnormal contrast enhancement within the brain. 3. In conjunction with the earlier performed noncontrast brain MRI, the findings altogether remain most suggestive of demyelinating disease, likely acute on chronic. Multiple sclerosis remains the primary consideration and neuromyelitis optica spectrum disorders the most likely alternative. Other demyelinating diseases, such as acute disseminated encephalomyelitis, are less likely. Electronically Signed   By: Deatra Robinson M.D.   On: 12/08/2017 21:48   Mr Cervical Spine W Wo Contrast  Result Date: 12/08/2017 CLINICAL DATA:  Suspected demyelinating disease. EXAM: MRI HEAD WITH CONTRAST MRI CERVICAL SPINE WITHOUT AND WITH CONTRAST TECHNIQUE: Multiplanar, multiecho pulse sequences of the cervical spine, to include the craniocervical junction and cervicothoracic junction, were obtained without and with intravenous contrast. Multiplanar, multi  echo pulse sequences of the brain and surrounding structures were obtained after the administration of intravenous contrast. CONTRAST:  20mL MULTIHANCE GADOBENATE DIMEGLUMINE 529 MG/ML IV SOLN COMPARISON:  None. FINDINGS: MRI HEAD FINDINGS Postcontrast imaging only was obtained, as a supplement to the earlier performed noncontrast MRI of the brain. There is no parenchymal contrast enhancement. There is a small developmental venous anomaly in the right frontal lobe. No optic nerve enhancement. MRI CERVICAL SPINE FINDINGS Alignment: Physiologic. Vertebrae: No fracture, evidence of discitis, or bone lesion. Cord: There are hyperintense T2-weighted signal lesions within the spinal cord at the C2-C5 levels. The lesions predominantly involve the central spinal cord. There is sparing of the right dorsal cord at C2 and C3 levels. The left dorsal cord is spared at the C4 and C5 levels. There is mild contrast enhancement at the C2 and C3 levels. Posterior Fossa, vertebral arteries, paraspinal tissues: Visualized posterior fossa is normal. Vertebral artery flow voids are preserved. No prevertebral soft tissue swelling. Disc levels: No disc herniation, spinal canal stenosis or neural foraminal stenosis. IMPRESSION: 1. Multiple large brain tense T2-weighted signal lesions within the cervical spinal cord, most suggestive of demyelinating disease. Contrast enhancement within the most superior lesions is consistent with active demyelination. 2. No abnormal contrast enhancement within the brain. 3. In conjunction with the earlier performed noncontrast brain MRI, the findings altogether remain most suggestive of demyelinating disease, likely acute on chronic. Multiple sclerosis remains the primary consideration and neuromyelitis optica spectrum disorders the most likely alternative. Other demyelinating diseases, such as acute disseminated encephalomyelitis, are less likely. Electronically  Signed   By: Deatra Robinson M.D.   On:  12/08/2017 21:48   Dg Chest Port 1 View  Result Date: 12/09/2017 CLINICAL DATA:  Demyelinating disease EXAM: PORTABLE CHEST 1 VIEW COMPARISON:  08/06/2014 FINDINGS: The heart size and mediastinal contours are within normal limits. Both lungs are clear. The visualized skeletal structures are unremarkable. IMPRESSION: No active disease. Electronically Signed   By: Jasmine Pang M.D.   On: 12/09/2017 03:29        Scheduled Meds: . DULoxetine  30 mg Oral BID  . enoxaparin (LOVENOX) injection  40 mg Subcutaneous Q24H   Continuous Infusions: . methylPREDNISolone (SOLU-MEDROL) injection Stopped (12/10/17 0132)     LOS: 2 days    Time spent: 30    Delaine Lame, MD [facility]  If 7PM-7AM, please contact night-coverage [email or web address] Password [facility] 12/10/2017, 9:42 AM

## 2017-12-11 MED ORDER — METHYLPREDNISOLONE SODIUM SUCC 1000 MG IJ SOLR
1000.0000 mg | INTRAMUSCULAR | Status: AC
  Administered 2017-12-11 – 2017-12-12 (×2): 1000 mg via INTRAVENOUS
  Filled 2017-12-11 (×3): qty 8

## 2017-12-11 NOTE — Progress Notes (Signed)
PROGRESS NOTE    Samantha [name]  RUE:[id number] DOB: 1986-01-21 DOA: 12/08/2017 PCP: Ardith Dark, MD   Brief Narrative: 32 year old with past medical history relevant for depression, peripheral neuropathy, coming in with diffuse weakness and found to have possible demyelinating process of the brain and C-spine concerning for multiple sclerosis versus neuromyelitis optica.  Assessment & Plan:   Principal Problem:   Demyelinating disease (HCC) Active Problems:   Tobacco abuse   Depression   #) Demyelinating process: Patient's symptoms have not improved dramatically.  She continues on significant amounts of steroids. -Continue 1000 milligrams methyl prednisolone daily, day 3 of 5 - Physical therapy Does not have any outpatient recommendations at this time -Occupational therapy recommends a tub and shower seat - Follow-up neuromyelitis optica antibodies - anti-Jo 1 antibody, ENA antibody titers negative -ACE levels normal -ANA negative -B12 levels 537 normal -patient will need MRI of thoracic spine as well as lumbar puncture which can be done as an outpatient -Patient will need outpatient neurology with Dr. Epimenio Foot who is a multiple sclerosis specialist  -neurology following appreciate recommendations  #) Depression: -Continue duloxetine 30 mg twice daily  Fluids: Tolerating p.o. Electrodes: Monitor and supplement Nutrition: Regular diet  Prophylaxis: enoxparin 40 mg  Disposition: Pending completion of IV steroids  Full code   Consultants:   Neurology  Procedures: (Don't include imaging studies which can be auto populated. Include things that cannot be auto populated i.e. Echo, Carotid and venous dopplers, Foley, Bipap, HD, tubes/drains, wound vac, central lines etc)  None  Antimicrobials: (specify start and planned stop date. Auto populated tables are space occupying and do not give end dates)  None   Subjective: Patient reports she is feeling well today.   She reports that she is able to ambulate more and has been walking around.  She reports some improvement in her strength in the upper extremities.  Objective: Vitals:   12/10/17 2057 12/10/17 2327 12/11/17 0320 12/11/17 0803  BP: (!) 142/73 129/79 133/83 (!) 130/91  Pulse: 68 75 (!) 57 63  Resp:  19 18 18   Temp: 98 F (36.7 C) 97.9 F (36.6 C) 98.2 F (36.8 C) 97.7 F (36.5 C)  TempSrc: Oral Oral Oral Axillary  SpO2: 98% 94% 97% 95%  Weight:      Height:        Intake/Output Summary (Last 24 hours) at 12/11/2017 1001 Last data filed at 12/11/2017 0900 Gross per 24 hour  Intake 240 ml  Output -  Net 240 ml   Filed Weights   12/08/17 1301 12/09/17 0000  Weight: 117 kg (258 lb) 115.2 kg (253 lb 15.5 oz)    Examination:  General exam: Appears calm and comfortable  Respiratory system: Clear to auscultation. Respiratory effort normal. Cardiovascular system: Regular rate and rhythm, no murmurs Gastrointestinal system: Abdomen is nondistended, soft and nontender. No organomegaly or masses felt. Normal bowel sounds heard. Central nervous system: Alert and oriented.  Greater distal than proximal upper extremity weakness worse on left, able to move all extremities against gravity Extremities: Trace lower extremity edema Skin: No rashes visible skin Psychiatry: Judgement and insight appear normal. Mood & affect appropriate.     Data Reviewed: I have personally reviewed following labs and imaging studies  CBC: Recent Labs  Lab 12/08/17 2240 12/09/17 0346  WBC 8.1 6.8  NEUTROABS 4.9  --   HGB 14.3 14.4  HCT 42.7 44.2  MCV 86.3 87.4  PLT 388 399   Basic Metabolic Panel: Recent  Labs  Lab 12/08/17 2240 12/09/17 0346  NA 137 137  K 3.7 3.8  CL 103 103  CO2 23 23  GLUCOSE 89 145*  BUN 5* 9  CREATININE 0.78 0.93  CALCIUM 9.0 8.9   GFR: Estimated Creatinine Clearance: 111.1 mL/min (by C-G formula based on SCr of 0.93 mg/dL). Liver Function Tests: No results for  input(s): AST, ALT, ALKPHOS, BILITOT, PROT, ALBUMIN in the last 168 hours. No results for input(s): LIPASE, AMYLASE in the last 168 hours. No results for input(s): AMMONIA in the last 168 hours. Coagulation Profile: No results for input(s): INR, PROTIME in the last 168 hours. Cardiac Enzymes: No results for input(s): CKTOTAL, CKMB, CKMBINDEX, TROPONINI in the last 168 hours. BNP (last 3 results) No results for input(s): PROBNP in the last 8760 hours. HbA1C: No results for input(s): HGBA1C in the last 72 hours. CBG: No results for input(s): GLUCAP in the last 168 hours. Lipid Profile: No results for input(s): CHOL, HDL, LDLCALC, TRIG, CHOLHDL, LDLDIRECT in the last 72 hours. Thyroid Function Tests: No results for input(s): TSH, T4TOTAL, FREET4, T3FREE, THYROIDAB in the last 72 hours. Anemia Panel: Recent Labs    12/08/17 2240  VITAMINB12 537   Sepsis Labs: No results for input(s): PROCALCITON, LATICACIDVEN in the last 168 hours.  No results found for this or any previous visit (from the past 240 hour(s)).       Radiology Studies: No results found.      Scheduled Meds: . DULoxetine  30 mg Oral BID  . enoxaparin (LOVENOX) injection  40 mg Subcutaneous Q24H   Continuous Infusions: . methylPREDNISolone (SOLU-MEDROL) injection Stopped (12/11/17 0156)     LOS: 3 days    Time spent: 30    Delaine Lame, MD [facility]  If 7PM-7AM, please contact night-coverage [email or web address] Password [email or web address] 12/11/2017, 10:01 AM

## 2017-12-11 NOTE — Progress Notes (Signed)
Neurology Progress Note  S:// No family at bedside. No new events reported overnight. Patient voice no new concerns. Admission symptoms slowly improving. Working with PT/OT. Denies andy H/A or visual changes, any new weakness or numbness on exam today. Patient says she is feeling better and looking forward to going home in AM.  O:// Exam: Vitals:   12/11/17 0803 12/11/17 1322  BP: (!) 130/91 (!) 141/76  Pulse: 63 74  Resp: 18 17  Temp: 97.7 F (36.5 C) 98.2 F (36.8 C)  SpO2: 95% 97%   Gen: In bed, NAD Resp: non-labored breathing, no acute distress Abd: soft, nt  Neuro: MS: Awake, alert, interactive and appropriate. Oriented x 3 CN: Pupils equal round and reactive, no APD, face symmetric Motor: Very mild 5-/5 weakness in the right upper extremity - improving  Pertinent Labs: Pending: [place], J1, ENA, Neuromyelitis optica  A:// Impression: 32 year old female with a history of multiple clinical episodes concerning for multiple sclerosis now with enhancing lesions as well as nonenhancing lesions and clinical symptoms.  Strongly suspect multiple sclerosis, however mimics are being assessed for with laboratory studies.  Symptoms again continue to improve on exam this morning. Gave patient some MS support information and useful websites.  P:// Recommendations: 1) Continue IV Solu-Medrol 1 g x5 doses total. Discussed with pharmacy to put end date on IV infusions. No need for taper. 2) PT OT 3) Follow up with outpatient [facility] Neurology Dr Epimenio Foot in 3 weeks  Beryl Meager, ANP-C [facility] 12/10/2017 9:44 AM  Neurology to sign off. Please call with any further questions or concerns. Thank you for this consultation  If 7pm- 7am, please page neurology on call as listed in AMION.

## 2017-12-12 LAB — NEUROMYELITIS OPTICA AUTOAB, IGG: NMO-IgG: <1.5 U/mL (ref 0.0–3.0)

## 2017-12-12 MED ORDER — HYDROCODONE-ACETAMINOPHEN 5-325 MG PO TABS: 1.0000 | ORAL_TABLET | Freq: Four times a day (QID) | ORAL | 0 refills | Status: DC | PRN

## 2017-12-12 NOTE — Care Management Note (Signed)
Case Management Note  Patient Details  Name: [name] MRN: [id number] Date of Birth: 17-Aug-1986  Subjective/Objective:                    Action/Plan: Pt discharging home with self care. CM consulted for outpatient therapy. CM met with the patient and she would like to attend [facility] Orders in Epic and information on the AVS.  PCP: Dr Jerline Pain Insurance: [employer] Pt has intermittent supervision at home and transportation home.  Expected Discharge Date:  12/12/17               Expected Discharge Plan:  OP Rehab  In-House Referral:     Discharge planning Services  CM Consult  Post Acute Care Choice:    Choice offered to:     DME Arranged:    DME Agency:     HH Arranged:    HH Agency:     Status of Service:  Completed, signed off  If discussed at [employer] of Stay Meetings, dates discussed:    Additional Comments:  Pollie Friar, RN 12/12/2017, 11:10 AM

## 2017-12-12 NOTE — Progress Notes (Signed)
12/12/17 1200  PT Visit Information  Last PT Received On 12/12/17  Assistance Needed +1  History of Present Illness Pt is a 32 y.o. female with medical history significant of depression, tobacco abuse, peripheral neuropathy, migraine headaches, who presents with diffuse weakness and numbness in all extremities.  MRI of brain and c spin with contrast showed enhancing lesions in the cervical spinal cord extending from C1-C4, almost into longitudinally extensive transverse myelitis, consistent with neuromyelitis optica.   Subjective Data  Subjective I walked a lot this weekend by myself  Patient Stated Goal to return to work  Precautions  Precautions Fall  Precaution Comments telemetry  Restrictions  Weight Bearing Restrictions No  Pain Assessment  Pain Assessment No/denies pain  Cognition  Arousal/Alertness Awake/alert  Behavior During Therapy WFL for tasks assessed/performed  Overall Cognitive Status Within Functional Limits for tasks assessed  Bed Mobility  Overal bed mobility Modified Independent  Transfers  Overall transfer level Modified independent  Equipment used None  Transfers Sit to/from Stand  General transfer comment stood by for safety but pt able to perform  Ambulation/Gait  Ambulation/Gait assistance Supervision  Ambulation Distance (Feet) 250 Feet  Assistive device None  Gait Pattern/deviations Step-through pattern;Wide base of support;Decreased stride length  General Gait Details supporting herself carefully and safe  Gait velocity reduced  Gait velocity interpretation Below normal speed for age/gender  Balance  Overall balance assessment Needs assistance  Sitting-balance support Feet supported  Sitting balance-Leahy Scale Good  Standing balance support No upper extremity supported  Standing balance-Leahy Scale Fair  Exercises  Exercises General Lower Extremity  General Exercises - Lower Extremity  Ankle Circles/Pumps AROM;Both;5 reps  Long Arc Quad  Strengthening;Both;10 reps  Heel Slides Strengthening;Both;10 reps  Hip ABduction/ADduction Strengthening;Both;10 reps  Other Exercises  Other Exercises reviewed ROM to cervical spine with located scapular posture B shoulders.  PT - End of Session  Equipment Utilized During Treatment Gait belt  Activity Tolerance Patient tolerated treatment well  Patient left in bed;with call bell/phone within reach;with family/visitor present  Nurse Communication Mobility status  PT - Assessment/Plan  PT Plan Current plan remains appropriate  PT Visit Diagnosis Other abnormalities of gait and mobility (R26.89);Other symptoms and signs involving the nervous system (R29.898)  PT Frequency (ACUTE ONLY) Min 3X/week  Follow Up Recommendations No PT follow up  PT equipment None recommended by PT  AM-PAC PT "6 Clicks" Daily Activity Outcome Measure  Difficulty turning over in bed (including adjusting bedclothes, sheets and blankets)? 4  Difficulty moving from lying on back to sitting on the side of the bed?  3  Difficulty sitting down on and standing up from a chair with arms (e.g., wheelchair, bedside commode, etc,.)? 3  Help needed moving to and from a bed to chair (including a wheelchair)? 3  Help needed walking in hospital room? 3  Help needed climbing 3-5 steps with a railing?  3  6 Click Score 19  Mobility G Code  CJ  PT Goal Progression  Progress towards PT goals Progressing toward goals  PT Time Calculation  PT Start Time (ACUTE ONLY) 1002  PT Stop Time (ACUTE ONLY) 1029  PT Time Calculation (min) (ACUTE ONLY) 27 min  PT G-Codes **NOT FOR INPATIENT CLASS**  Functional Assessment Tool Used AM-PAC 6 Clicks Basic Mobility  PT General Charges  $$ ACUTE PT VISIT 1 Visit  PT Treatments  $Gait Training 8-22 mins  $Therapeutic Exercise 8-22 mins    Samul Dada, PT MS Acute Rehab Dept.  Number: [place] and [place]

## 2017-12-12 NOTE — Discharge Summary (Signed)
Physician Discharge Summary  Patient ID: Samantha [name] MRN: [id number] DOB/AGE: [birth year] 32 y.o.  Admit date: 12/08/2017 Discharge date: 12/12/2017  Admission Diagnoses: Principal Problem:   Demyelinating disease (HCC) Active Problems:   Tobacco abuse   Depression   Generalized weakness   Gait disturbance   Discharge Diagnoses:  Principal Problem:   Demyelinating disease (HCC) Active Problems:   Tobacco abuse   Depression   Discharged Condition: good  Hospital Course: 32 year old with past medical history relevant for depression, peripheral neuropathy, coming in with diffuse weakness, difficulty with cramping of fingers on both hands uncontrollable, and found to have possible demyelinating process of the brain and C-spine concerning for multiple sclerosis most likely, less likely neuromyelitis optica or other rare disease.    #) Demyelinating process: seen by neurology, work up with MRI neck and brain showed multiple lesions in C-spine and brain suggestive of MS, less likely other demyelinating process such as neuromyelitis optica.  At dc serologies are pending. Pt symptoms improved in hospital with high dose IV solumedrol, 5 gm total at dc. No taper needed, pt will f/u with Dr Epimenio Foot neurology in 3 wks, pt is to call for appt. when she is dc'd.  - Physical therapy Does not have any outpatient recommendations at this time -Occupational therapy recommends a tub and shower seat - Follow-up neuromyelitis optica antibodies - anti-Jo 1 antibody, ENA antibody titers negative -ACE levels normal -ANA negative -B12 levels 537 normal -patient will need MRI of thoracic spine as well as lumbar puncture which can be done as an outpatient  #) Depression: -Continue duloxetine 30 mg twice daily  Consults: neurology  Significant Diagnostic Studies:  MRI head w and w/o contrast MRI C-spine   Treatments: 5 days IV solumedrol 1000 mg per day ( 5 gm total)  Discharge Exam: Blood  pressure (!) 141/94, pulse (!) 58, temperature (!) 97.5 F (36.4 C), temperature source Oral, resp. rate 20, height 5\' 5"  (1.651 m), weight 115.2 kg (253 lb 15.5 oz), SpO2 96 %. General exam: Appears calm and comfortable  Respiratory system: Clear to auscultation. Respiratory effort normal. Cardiovascular system: Regular rate and rhythm, no murmurs Gastrointestinal system: Abdomen is nondistended, soft and nontender. No organomegaly or masses felt. Normal bowel sounds heard. Central nervous system: Alert and oriented.  Greater distal than proximal upper extremity weakness worse on left, able to move all extremities against gravity Extremities: Trace lower extremity edema Skin: No rashes visible skin Psychiatry: Judgement and insight appear normal. Mood & affect appropriate.     Disposition: 01-Home or Self Care  Discharge Instructions    Ambulatory referral to Neurology   Complete by:  As directed    An appointment is requested in approximately: 3 weeks   Discharge patient   Complete by:  As directed    Discharge disposition:  01-Home or Self Care   Discharge patient date:  12/12/2017     Allergies as of 12/12/2017   No Known Allergies     Medication List    TAKE these medications   acetaminophen 500 MG tablet Commonly known as:  TYLENOL Take 500-1,000 mg by mouth every 6 (six) hours as needed for moderate pain.   DULoxetine 30 MG capsule Commonly known as:  CYMBALTA Take 1 tablet daily for 1 week, then increase to 2 tablets daily. What changed:    how much to take  how to take this  when to take this  additional instructions   HYDROcodone-acetaminophen 5-325 MG tablet Commonly known  as:  NORCO/VICODIN Take 1-2 tablets by mouth every 6 (six) hours as needed for moderate pain or severe pain.      Follow-up Information    Sater, Pearletha Furl, MD. Schedule an appointment as soon as possible for a visit in 3 week(s).   Specialty:  Neurology Contact information: [address] [place] [address] [phone]           Signed: Maree Krabbe 12/12/2017, 10:58 AM

## 2017-12-12 NOTE — Progress Notes (Signed)
Occupational Therapy Treatment Patient Details Name: Samantha [name] MRN: [id number] DOB: 01-01-1986 Today's Date: 12/12/2017    History of present illness Pt is a 32 y.o. female with medical history significant of depression, tobacco abuse, peripheral neuropathy, migraine headaches, who presents with diffuse weakness and numbness in all extremities.  MRI of brain and c spin with contrast showed enhancing lesions in the cervical spinal cord extending from C1-C4, almost into longitudinally extensive transverse myelitis, consistent with neuromyelitis optica.    OT comments  Pt progressing towards established OT goals. Pt performing grooming and functional mobility at supervision level. Educating pt on safe tub transfer techniques; pt performing transfer with [employer] A for safety and demonstrating good understanding of education. Reviewed FM and theraputty exercises; pt verbalizing and demonstrating understanding. Will continue to follow pt as admitted. Continue to recommend dc home once medically stable per physician.    Follow Up Recommendations  Supervision - Intermittent;Outpatient OT(Neuro OP OT)    Equipment Recommendations  Tub/shower seat(Educated pt on benefits of tub seat; she states she does not want tub seat; educated pt on safety)    Recommendations for Other Services      Precautions / Restrictions Precautions Precautions: Fall Restrictions Weight Bearing Restrictions: No       Mobility Bed Mobility Overal bed mobility: Modified Independent                Transfers Overall transfer level: Needs assistance Equipment used: None Transfers: Sit to/from Stand Sit to Stand: Supervision         General transfer comment: supervision for safety, no physical assist needed     Balance Overall balance assessment: Needs assistance Sitting-balance support: No upper extremity supported;Feet supported Sitting balance-Leahy Scale: Good     Standing balance support:  No upper extremity supported Standing balance-Leahy Scale: Fair                             ADL either performed or assessed with clinical judgement   ADL Overall ADL's : Needs assistance/impaired     Grooming: Wash/dry face;Set up;Supervision/safety;Standing;Oral care Grooming Details (indicate cue type and reason): Pt performing grooming at sink with supervision for safety. Pt with single LOB to right and able to self correct. Discussed safety and balance. Pt very aware of balance deficits.               Lower Body Dressing Details (indicate cue type and reason): Discussed safety during bathing. Educated pt on fatigue and taking warm showers instead of hot showers.          Tub/ Shower Transfer: Tub transfer;Min guard;Ambulation;Stand-pivot Tub/Shower Transfer Details (indicate cue type and reason): Educating pt on safe tub transfer techniques. Pt performing transfer with [employer] for safety.  Functional mobility during ADLs: Min guard;Supervision/safety General ADL Comments: Pt performing grooming, funcitonal mobility at hallway level, and tub transfer. Reviewed excercises for BUEs to increase FM skills; pt demonstrating understanding.     Vision   Vision Assessment?: No apparent visual deficits   Perception     Praxis      Cognition Arousal/Alertness: Awake/alert Behavior During Therapy: WFL for tasks assessed/performed Overall Cognitive Status: Within Functional Limits for tasks assessed                                 General Comments: Slow and purposeful speech. WFL for tasks completed.  Pt recalling excercises and safety education from prior sessions        Exercises Exercises: Other exercises Other Exercises Other Exercises: Reviewed FM excerises and theraputty exercises. Pt verbalizing and demonstrated understanding.   Shoulder Instructions       General Comments      Pertinent Vitals/ Pain       Pain Assessment:  Faces Faces Pain Scale: Hurts a little bit Pain Location: Stomach ache Pain Descriptors / Indicators: Discomfort Pain Intervention(s): Monitored during session  Home Living                                          Prior Functioning/Environment              Frequency  Min 2X/week        Progress Toward Goals  OT Goals(current goals can now be found in the care plan section)  Progress towards OT goals: Progressing toward goals  Acute Rehab OT Goals Patient Stated Goal: to return to work OT Goal Formulation: With patient Time For Goal Achievement: 12/23/17 Potential to Achieve Goals: Good ADL Goals Pt Will Perform Grooming: with modified independence;standing Pt Will Perform Lower Body Bathing: with modified independence;sit to/from stand Pt Will Perform Lower Body Dressing: with modified independence;sit to/from stand Pt Will Transfer to Toilet: with modified independence;ambulating;regular height toilet Pt Will Perform Toileting - Clothing Manipulation and hygiene: with modified independence;sit to/from stand Pt Will Perform Tub/Shower Transfer: with modified independence;ambulating;Tub transfer;shower seat Pt/caregiver will Perform Home Exercise Program: Increased strength;Increased ROM;Both right and left upper extremity;With theraputty(fine motor/coordination)  Plan Discharge plan remains appropriate    Co-evaluation                 AM-PAC PT "6 Clicks" Daily Activity     Outcome Measure   Help from another person eating meals?: None Help from another person taking care of personal grooming?: None Help from another person toileting, which includes using toliet, bedpan, or urinal?: None Help from another person bathing (including washing, rinsing, drying)?: A Little Help from another person to put on and taking off regular upper body clothing?: None Help from another person to put on and taking off regular lower body clothing?: A  Little 6 Click Score: 22    End of Session Equipment Utilized During Treatment: Gait belt  OT Visit Diagnosis: Other abnormalities of gait and mobility (R26.89);Other symptoms and signs involving the nervous system (R29.898)   Activity Tolerance Patient tolerated treatment well   Patient Left with call bell/phone within reach;with family/visitor present;Other (comment)(sitting EOB )   Nurse Communication Mobility status        Time: 4098-1191 OT Time Calculation (min): 16 min  Charges: OT General Charges $OT Visit: 1 Visit OT Treatments $Self Care/Home Management : 8-22 mins  Abubakr Wieman MSOT, OTR/L Acute Rehab Pager: [phone] Office: [phone]   Theodoro Grist Rashida Ladouceur 12/12/2017, 8:37 AM

## 2017-12-13 ENCOUNTER — Telehealth: Payer: Self-pay | Admitting: *Deleted

## 2017-12-13 NOTE — Telephone Encounter (Signed)
Per chart review: Admit date: 12/08/2017 Discharge date: 12/12/2017  Admission Diagnoses: Principal Problem: Demyelinating disease (HCC) Active Problems: Tobacco abuse Depression   Generalized weakness   Gait disturbance   Discharge Diagnoses:  Principal Problem:   Demyelinating disease (HCC) Active Problems:   Tobacco abuse   Depression   Discharged Condition: good _______________________________________________________ Per telephone call: Transition Care Management Follow-up Telephone Call   Date discharged? 12/12/17   How have you been since you were released from the hospital? "ok"   Do you understand why you were in the hospital? yes   Do you understand the discharge instructions? yes   Where were you discharged to? Home   Items Reviewed:  Medications reviewed: yes  Allergies reviewed: yes  Dietary changes reviewed: yes  Referrals reviewed: yes   Functional Questionnaire:   Activities of Daily Living (ADLs):   She states they are independent in the following: ambulation, bathing and hygiene, feeding, continence, grooming, toileting and dressing States they require assistance with the following: none   Any transportation issues/concerns?: no   Any patient concerns? No. She states that she is feeling better and was even able to drive.    Confirmed importance and date/time of follow-up visits scheduled yes  Provider Appointment booked with Dr [name] 12/14/17 2:00. Patient is also following up with neurology tomorrow afternoon.  Confirmed with patient if condition begins to worsen call PCP or go to the ER.  Patient was given the office number and encouraged to call back with question or concerns.  : yes

## 2017-12-14 ENCOUNTER — Encounter: Payer: Self-pay | Admitting: Diagnostic Neuroimaging

## 2017-12-14 ENCOUNTER — Ambulatory Visit (INDEPENDENT_AMBULATORY_CARE_PROVIDER_SITE_OTHER): Payer: BLUE CROSS/BLUE SHIELD | Admitting: Diagnostic Neuroimaging

## 2017-12-14 ENCOUNTER — Ambulatory Visit: Payer: BLUE CROSS/BLUE SHIELD | Admitting: Family Medicine

## 2017-12-14 ENCOUNTER — Encounter: Payer: Self-pay | Admitting: Family Medicine

## 2017-12-14 VITALS — BP 138/88 | HR 77 | Ht 65.0 in | Wt 256.2 lb

## 2017-12-14 DIAGNOSIS — G379 Demyelinating disease of central nervous system, unspecified: Secondary | ICD-10-CM

## 2017-12-14 DIAGNOSIS — G35 Multiple sclerosis: Secondary | ICD-10-CM

## 2017-12-14 MED ORDER — BACLOFEN 10 MG PO TABS: 5.0000 mg | ORAL_TABLET | Freq: Two times a day (BID) | ORAL | 6 refills | Status: DC | PRN

## 2017-12-14 NOTE — Progress Notes (Signed)
JCV sample to [facility] 12-14-17. Carie Caddy, RN

## 2017-12-14 NOTE — Progress Notes (Addendum)
[facility]  PATIENT: Samantha [name] DOB: [birth year]  REFERRING CLINICIAN: Jacquiline Doe HISTORY FROM: patient and father and chart review  REASON FOR VISIT: new consult    HISTORICAL  CHIEF COMPLAINT:  Chief Complaint  Patient presents with  . NX  Jacquiline Doe MD    ABN MRI brain and spinal cord.  . Demyelinating Disease    Hospital Course: 32 year old with past medical history relevant for depression, peripheral neuropathy, coming in with diffuse weakness, difficulty with cramping of fingers on both hands uncontrollable, and found to have possible demyelinating process of the brain and C-spine concerning for multiple sclerosis most likely, less likely neuromyelitis optica or other rare disease.  Finished 5 day course fo IV steroid 1000mg  on 12-12-17     HISTORY OF PRESENT ILLNESS:   32 year old female here for evaluation of multiple sclerosis.  Around 2015 patient had onset of numbness and tingling and pain in her feet.  This started to limit her physical exercise and activity.  Patient was evaluated in neurology clinic in 2016 for neuropathy evaluation.  EMG nerve conduction in 2017 was unremarkable.  Patient also had episode of losing vision in 1 of her eye lasting 1 month about 2 years ago.  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  Symptoms improved over about 1 month time.  Over the last few months patient has had worsening numbness and tingling in her hands.  She also reports stiffness in her fingertips and hands.  She has intermittent muscle spasms.  Also has intermittent speech and stuttering problems.  Also has intermittent confusion.  Patient ultimately went to emergency room and was admitted to the hospital.  MRI of the brain and spinal cord showed multiple acute and chronic demyelinating plaques.  Patient was treated with IV steroids.  Now patient following up in neurology clinic for further diagnosis, treatment and  management of possible multiple sclerosis.  Patient has fatigue, memory loss, insomnia, tremor, decreased energy, joint pain, feeling hot, numbness and weakness.  No bowel or bladder incontinence.   REVIEW OF SYSTEMS: Full 14 system review of systems performed and negative with exception of: As per HPI.  ALLERGIES: No Known Allergies  HOME MEDICATIONS: Outpatient Medications Prior to Visit  Medication Sig Dispense Refill  . acetaminophen (TYLENOL) 500 MG tablet Take 500-1,000 mg by mouth every 6 (six) hours as needed for moderate pain.    . DULoxetine (CYMBALTA) 30 MG capsule Take 1 tablet daily for 1 week, then increase to 2 tablets daily. (Patient taking differently: Take 30 mg by mouth 2 (two) times daily. ) 60 capsule 3  . HYDROcodone-acetaminophen (NORCO/VICODIN) 5-325 MG tablet Take 1-2 tablets by mouth every 6 (six) hours as needed for moderate pain or severe pain. 30 tablet 0   No facility-administered medications prior to visit.     PAST MEDICAL HISTORY: Past Medical History:  Diagnosis Date  . Arthritis   . Chicken pox   . Chlamydia 12/02/2011  . Depression   . Fracture of left leg 2009   removal metal plate in left leg 2016  . Frequent headaches   . Infection    UTI  . Kidney stone    Kidney stone  . Migraines   . Neuropathy   . PCOS (polycystic ovarian syndrome)   . Tobacco abuse     PAST SURGICAL HISTORY: Past Surgical History:  Procedure Laterality Date  . CESAREAN SECTION N/A 05/12/2013   Procedure: Primary cesarean section with  delivery of baby boy at 89.;  Surgeon: Freddrick March. Tenny Craw, MD;  Location: [facility];  Service: Obstetrics;  Laterality: N/A;  . REMOVAL OF IMPLANT Left 09/25/2015   Procedure: LEFT ANKLE REMOVAL OF DEEP  IMPLANT DEBRIDEMENT OF ANTERIOR MEDIAL ANKLE JOINT;  Surgeon: Toni Arthurs, MD;  Location:  [facility]ERY [facility];  Service: Orthopedics;  Laterality: Left;  . TIBIA FRACTURE SURGERY      FAMILY HISTORY: Family History  Problem  Relation Age of Onset  . Hypertension Other   . Diabetes Other   . Ulcers Mother   . GER disease Mother   . Asthma Mother   . Heart attack Mother   . Lupus Mother   . Cancer Father   . Drug abuse Father   . GER disease Sister   . Asthma Brother   . Birth defects Maternal Grandfather   . Heart attack Maternal Grandfather   . High blood pressure Maternal Grandfather   . Stroke Maternal Grandfather   . Autism Son   . Learning disabilities Son     SOCIAL HISTORY:  Social History   Socioeconomic History  . Marital status: Married    Spouse name: Not on file  . Number of children: Not on file  . Years of education: Not on file  . Highest education level: Not on file  Social Needs  . Financial resource strain: Not on file  . Food insecurity - worry: Not on file  . Food insecurity - inability: Not on file  . Transportation needs - medical: Not on file  . Transportation needs - non-medical: Not on file  Occupational History  . Not on file  Tobacco Use  . Smoking status: Current Every Day Smoker    Packs/day: 0.25    Years: 2.00    Pack years: 0.50    Types: Cigarettes  . Smokeless tobacco: Never Used  . Tobacco comment: quit with preg  Substance and Sexual Activity  . Alcohol use: No    Comment: socially none since pregnancy  . Drug use: No  . Sexual activity: Yes    Birth control/protection: None    Comment: desires pregnancy  Other Topics Concern  . 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  Visual Acuity Screening   Right eye Left eye Both eyes  Without correction:     With correction: 20/50 20/50      Patient is in  no distress; well developed, nourished and groomed; neck is supple  CARDIOVASCULAR:  Examination of carotid arteries is normal; no carotid bruits  Regular rate and rhythm, no murmurs  Examination of peripheral vascular system by observation and palpation is normal  EYES:  Ophthalmoscopic exam of optic discs and posterior segments is normal; no papilledema or hemorrhages  MUSCULOSKELETAL:  Gait, strength, tone, movements noted in Neurologic exam below  NEUROLOGIC: MENTAL STATUS:  No flowsheet data found.  awake, alert, oriented to person, place and time  recent and remote memory intact  normal attention and concentration  language fluent, comprehension intact, naming intact,   fund of knowledge appropriate  CRANIAL NERVE:   2nd - no papilledema on fundoscopic exam  2nd, 3rd, 4th, 6th - pupils equal and reactive to light, visual fields full to confrontation, extraocular muscles intact, no nystagmus  5th - facial sensation symmetric  7th - facial strength symmetric  8th - hearing intact  9th - palate elevates symmetrically, uvula midline  11th - shoulder shrug symmetric  12th - tongue protrusion midline  MOTOR:   normal bulk and tone, full strength in the BUE, BLE; EXCEPT INCREASED TONE IN ARMS  SENSORY:   normal and symmetric to light touch, temperature, vibration; EXCEPT DECR VIB AT HANDS  COORDINATION:   finger-nose-finger, fine finger movements --> DYSMETRIA WITH BUE  REFLEXES:   deep tendon reflexes present and symmetric  GAIT/STATION:   WIDE BASED GAIT; SLIGHTLY UNSTEADY; DIFF WITH TANDEM; romberg is negative    DIAGNOSTIC DATA (LABS, IMAGING, TESTING) - I reviewed patient records, labs, notes, testing and imaging myself where available.  Lab Results  Component Value Date   WBC 6.8 12/09/2017   HGB 14.4 12/09/2017   HCT 44.2 12/09/2017   MCV 87.4 12/09/2017   PLT 399 12/09/2017      Component Value Date/Time   NA 137 12/09/2017  0346   NA 137 12/16/2016   K 3.8 12/09/2017 0346   CL 103 12/09/2017 0346   CO2 23 12/09/2017 0346   GLUCOSE 145 (H) 12/09/2017 0346   BUN 9 12/09/2017 0346   BUN 8 12/16/2016   CREATININE 0.93 12/09/2017 0346   CALCIUM 8.9 12/09/2017 0346   PROT 7.0 11/14/2017 1512   ALBUMIN 3.6 11/14/2017 1512   AST 11 11/14/2017 1512   ALT 10 11/14/2017 1512   ALKPHOS 80 11/14/2017 1512   BILITOT 0.5 11/14/2017 1512   GFRNONAA >60 12/09/2017 0346   GFRAA >60 12/09/2017 0346   Lab Results  Component Value Date   CHOL 191 12/16/2016   HDL 33 (A) 12/16/2016   LDLCALC 154 (H) 12/10/2011   TRIG 89 12/16/2016   CHOLHDL 7.1 12/10/2011   Lab Results  Component Value Date   HGBA1C 6.5 11/14/2017   Lab Results  Component Value Date   VITAMINB12 537 12/08/2017   Lab Results  Component Value Date   TSH 1.64 11/14/2017    12/08/17  HIV Screen 4th Generation wRfx Non Reactive Non Reactive    12/08/17  NMO-IgG 0.0 - 3.0 U/mL <1.5   Comment: (NOTE)                Negative:   0.0 - 3.0                Positive:      >3.0    12/08/17  Ribonucleic Protein 0.0 - 0.9 AI <0.2 VC  SSA (Ro) (ENA) Antibody, IgG 0.0 - 0.9 AI <0.2   Scleroderma (Scl-70) (ENA) Antibody, IgG 0.0 - 0.9 AI <0.2   ENA SM Ab Ser-aCnc 0.0 - 0.9 AI <0.2   SSB (La) (ENA) Antibody, IgG 0.0 - 0.9 AI <0.2   ds DNA Ab 0 - 9 IU/mL <1     12/08/17 MRI brain (without) [I reviewed images myself and agree with interpretation. -VRP]  1. Multiple deep and periventricular white matter lesions in a pattern that is nonspecific but concerning for demyelinating disease. CSF sampling is recommended. Additionally, postcontrast imaging might be helpful to assess for active demyelination or inflammation. 2. No acute ischemia. - Additionally, on the sagittal FLAIR sequence, hyperintense T2-weighted signal lesions can be seen in  the C2 and C3 levels and, possibly, also at the level of the C4-5 disc. This  increases the likelihood of demyelinating disease, with differential considerations including multiple sclerosis or neuromyelitis optica spectrum disorders. In addition to the postcontrast imaging of the brain previously recommended, MRI of the cervical spine with and without contrast is also suggested. I discussed this with Dr. Wilford Corner at 7:59 p.m. on 12/08/2017.   12/08/17 MRI brain (with) and cervical (with and without) [I reviewed images myself and agree with interpretation except for below. -VRP]  1. Multiple large hypertense T2-weighted signal lesions within the cervical spinal cord, most suggestive of demyelinating disease. Contrast enhancement within the most superior lesions is consistent with active demyelination. 2. No abnormal contrast enhancement within the brain. --> [single right parietal enhancing lesion. -VRP] 3. In conjunction with the earlier performed noncontrast brain MRI, the findings altogether remain most suggestive of demyelinating disease, likely acute on chronic. Multiple sclerosis remains the primary consideration and neuromyelitis optica spectrum disorders the most likely alternative. Other demyelinating diseases, such as acute disseminated encephalomyelitis, are less likely.     ASSESSMENT AND PLAN  32 y.o. year old female here with constellation of symptoms including prior numbness and tingling pain in feet, current numbness and tingling in hands, prior unilateral vision loss for 1 month, current speech and stuttering problem, current fatigue, insomnia, tremor.  Signs and symptoms, neurologic exam and MRI consistent with multiple sclerosis.  We will proceed with further lab testing and treatment.   Dx:  1. MS (multiple sclerosis) (HCC)      PLAN:  RELAPSING REMITTING MULTIPLE SCLEROSIS - check Hep B, Tb, JCV labs - consider ocrevus vs tysabri (due to younger age and multiple spinal cord lesions); will decide after JCV ab testing  MUSCLE SPASMS - trial of  baclofen 5-10mg  twice a day as needed  Orders Placed This Encounter  Procedures  . Hep B Surface Antibody  . Hep B Surface Antigen  . Hepatitis B Core AB, Total  . QuantiFERON-TB Gold Plus  . Stratify JCV Ab (w/ Index) w/ Rflx  . VITAMIN D 25 Hydroxy (Vit-D Deficiency, Fractures)   Meds ordered this encounter  Medications  . baclofen (LIORESAL) 10 MG tablet    Sig: Take 0.5-1 tablets (5-10 mg total) by mouth 2 (two) times daily as needed for muscle spasms.    Dispense:  30 each    Refill:  6   Return in about 3 months (around 03/16/2018).  I reviewed images, labs, notes, records myself. I summarized findings and reviewed with patient, for this high risk condition (new onset multiple sclerosis) requiring high complexity decision making.    Suanne Marker, MD 12/14/2017, 2:04 PM Certified in Neurology, Neurophysiology and Neuroimaging  [facility] [address], [address] [place], [place] [address] [phone]

## 2017-12-14 NOTE — Patient Instructions (Addendum)
-   check labs  - start vitamin D 1000 units per day  - consider ocrevus or tysabri  - visit [email or web address].org for more information

## 2017-12-14 NOTE — Patient Instructions (Signed)
I am glad that you are feeling better.  We do not need to make any medication changes today.  Come back to see me in 3-6 months, or sooner as needed.  Take care, Dr [name]

## 2017-12-14 NOTE — Progress Notes (Signed)
Subjective:  Samantha [name] is a 32 y.o. female who presents today for a TCM visit.  HPI:  Summary of Hospital admission: Reason for admission: Abnormal brain MRI Date of admission: 12/08/2017 Date of discharge: 12/12/2017 Date of Interactive contact: 12/13/2017 Summary of Hospital course:  Patient presented to the ED on 12/12/2017 with progressively worsening left upper extremity weakness and pain.  She had a MRI performed in the emergency department which showed multiple lesions suggestive of multiple sclerosis.  Neurology was consulted and patient was admitted to the hospital for IV steroids.  She was evaluated physical therapy who did not have any outpatient recommendations. Patient's symptoms improved and she was discharged in stable condition.   Left Arm Weakness/Abnormal Brain MRI Symptoms have significantly improved over the past few days. She is still not back at her baseline but has noticed significant improvement. She still has some pain to her hands bilaterally. No other weakness or numbness.  No vision changes.  She is still taking the cymbalta which she thinks helps with her pain symptoms. She has not noticed any side effects from this medication. She has not noticed any other aggravating or alleviating factors.   She has been coping with her symptoms ok. Has a lot of support systems at home.   ROS: Per HPI, otherwise a complete review of systems was negative.   PMH:  The following were reviewed and entered/updated in epic: Past Medical History:  Diagnosis Date  . Arthritis   . Chicken pox   . Chlamydia 12/02/2011  . Depression   . Fracture of left leg   . Frequent headaches   . Infection    UTI  . Kidney stone    Kidney stone  . Migraines   . Neuropathy   . PCOS (polycystic ovarian syndrome)   . Tobacco abuse    Patient Active Problem List   Diagnosis Date Noted  . Demyelinating disease (HCC) 12/08/2017  . Tobacco abuse    Past Surgical History:    Procedure Laterality Date  . CESAREAN SECTION N/A 05/12/2013   Procedure: Primary cesarean section with delivery of baby boy at 0754.;  Surgeon: Freddrick March. Tenny Craw, MD;  Location: [facility];  Service: Obstetrics;  Laterality: N/A;  . REMOVAL OF IMPLANT Left 09/25/2015   Procedure: LEFT ANKLE REMOVAL OF DEEP  IMPLANT DEBRIDEMENT OF ANTERIOR MEDIAL ANKLE JOINT;  Surgeon: Toni Arthurs, MD;  Location: [facility];  Service: Orthopedics;  Laterality: Left;  . TIBIA FRACTURE SURGERY      Family History  Problem Relation Age of Onset  . Hypertension Other   . Diabetes Other   . Ulcers Mother   . GER disease Mother   . Asthma Mother   . Heart attack Mother   . Lupus Mother   . Cancer Father   . Drug abuse Father   . GER disease Sister   . Asthma Brother   . Birth defects Maternal Grandfather   . Heart attack Maternal Grandfather   . High blood pressure Maternal Grandfather   . Stroke Maternal Grandfather   . Autism Son   . Learning disabilities Son     Medications- Reconciled discharge and current medications in Epic.  Current Outpatient Medications  Medication Sig Dispense Refill  . acetaminophen (TYLENOL) 500 MG tablet Take 500-1,000 mg by mouth every 6 (six) hours as needed for moderate pain.    . DULoxetine (CYMBALTA) 30 MG capsule Take 1 tablet daily for 1 week, then increase  to 2 tablets daily. (Patient taking differently: Take 30 mg by mouth 2 (two) times daily. ) 60 capsule 3  . HYDROcodone-acetaminophen (NORCO/VICODIN) 5-325 MG tablet Take 1-2 tablets by mouth every 6 (six) hours as needed for moderate pain or severe pain. 30 tablet 0   No current facility-administered medications for this visit.     Allergies-reviewed and updated No Known Allergies  Social History   Socioeconomic History  . Marital status: Married    Spouse name: None  . Number of children: None  . Years of education: None  . Highest education level: None  Social Needs  . Financial resource  strain: None  . Food insecurity - worry: None  . Food insecurity - inability: None  . Transportation needs - medical: None  . Transportation needs - non-medical: None  Occupational History  . None  Tobacco Use  . Smoking status: Current Every Day Smoker    Packs/day: 0.25    Years: 2.00    Pack years: 0.50    Types: Cigarettes  . Smokeless tobacco: Never Used  . Tobacco comment: quit with preg  Substance and Sexual Activity  . Alcohol use: No    Comment: socially none since pregnancy  . Drug use: No  . Sexual activity: Yes    Birth control/protection: None    Comment: desires pregnancy  Other Topics Concern  . 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 Soft, Nontender, Nondistended. MSK: No edema, cyanosis, or clubbing noted Skin: Warm, dry Neuro: CN2-12 intact. Strength 5/5 in upper extremities. Sensation to light touch intact throughout.  Psych: Normal affect and thought content  Summary/Review of work up during hospitalization: BMET 12/09/2017: Sodium 137, potassium 3.8, chloride 103, bicarb 23, glucose 145, BUN 9, creatinine 0.93, calcium 8.9 CBC 12/09/2017: WBC 6.8, hemoglobin 14.4, hematocrit 44.2, platelets 399  MR Brain 12/08/2017: Multiple deep and periventricular white matter lesions concerning for demylinating disease MR C spine 12/08/2017: Multiple deep T2 weigted signal lesions within cervical spinal cord.  Assessment/Plan:  Demyelinating disease (HCC) New  Problem. Symptoms are improving after receiving 5 days of IV systemic steroids in the hospital.  Her neurological exam is significantly improved compared to where she was last week.  Leading diagnosis at this time is multiple sclerosis, however she will be undergoing further workup to rule out other causes including thoracic spine MRI.  She will be following up with neurology later today.    Overall, seems to be coping well with her diagnosis.  Discussed supportive measures including behavioral therapy in our office, however patient deferred.  She seems to have a lot of support already in place.  We will continue her Cymbalta for now as this seems to be helping with her neuropathic type pain and she is not having any side effects from this medication.  Patient has a high level of medical complexity due to number of diagnoses/treatment options  and amount/complexity of data reviewed.   Katina Degree. [name], MD 12/14/2017 9:12 AM

## 2017-12-14 NOTE — Assessment & Plan Note (Addendum)
New Problem. Symptoms are improving after receiving 5 days of IV systemic steroids in the hospital.  Her neurological exam is significantly improved compared to where she was last week.  Leading diagnosis at this time is multiple sclerosis, however she will be undergoing further workup to rule out other causes including thoracic spine MRI.  She will be following up with neurology later today.    Overall, seems to be coping well with her diagnosis.  Discussed supportive measures including behavioral therapy in our office, however patient deferred.  She seems to have a lot of support already in place.  We will continue her Cymbalta for now as this seems to be helping with her neuropathic type pain and she is not having any side effects from this medication.

## 2017-12-15 ENCOUNTER — Ambulatory Visit: Payer: BLUE CROSS/BLUE SHIELD | Attending: Nephrology | Admitting: Occupational Therapy

## 2017-12-15 ENCOUNTER — Other Ambulatory Visit: Payer: Self-pay

## 2017-12-15 ENCOUNTER — Telehealth: Payer: Self-pay

## 2017-12-15 DIAGNOSIS — G35 Multiple sclerosis: Secondary | ICD-10-CM

## 2017-12-15 DIAGNOSIS — R208 Other disturbances of skin sensation: Secondary | ICD-10-CM | POA: Diagnosis present

## 2017-12-15 DIAGNOSIS — R41840 Attention and concentration deficit: Secondary | ICD-10-CM | POA: Insufficient documentation

## 2017-12-15 DIAGNOSIS — R278 Other lack of coordination: Secondary | ICD-10-CM | POA: Insufficient documentation

## 2017-12-15 DIAGNOSIS — R2681 Unsteadiness on feet: Secondary | ICD-10-CM | POA: Diagnosis present

## 2017-12-15 DIAGNOSIS — M6281 Muscle weakness (generalized): Secondary | ICD-10-CM | POA: Diagnosis present

## 2017-12-15 NOTE — Therapy (Signed)
[facility] [address] [place], [place], [address] Phone: [phone]   Fax:  [phone]  Occupational Therapy Evaluation  Patient Details  Name: Samantha [name] MRN: [id number] Date of Birth: [birth year] No Data Recorded  Encounter Date: 12/15/2017  OT End of Session - 12/15/17 1629    Visit Number  1    Number of Visits  17    Date for OT Re-Evaluation  02/14/18    Authorization Type  BC/BS    OT Start Time  1530    OT Stop Time  1615    OT Time Calculation (min)  45 min    Activity Tolerance  Patient tolerated treatment well       Past Medical History:  Diagnosis Date  . Arthritis   . Chicken pox   . Chlamydia 12/02/2011  . Depression   . Fracture of left leg 2009   removal metal plate in left leg 2016  . Frequent headaches   . Infection    UTI  . Kidney stone    Kidney stone  . Migraines   . Neuropathy   . PCOS (polycystic ovarian syndrome)   . Tobacco abuse     Past Surgical History:  Procedure Laterality Date  . CESAREAN SECTION N/A 05/12/2013   Procedure: Primary cesarean section with delivery of baby boy at 0754.;  Surgeon: Freddrick March. Tenny Craw, MD;  Location: [facility];  Service: Obstetrics;  Laterality: N/A;  . REMOVAL OF IMPLANT Left 09/25/2015   Procedure: LEFT ANKLE REMOVAL OF DEEP  IMPLANT DEBRIDEMENT OF ANTERIOR MEDIAL ANKLE JOINT;  Surgeon: Toni Arthurs, MD;  Location: [facility];  Service: Orthopedics;  Laterality: Left;  . TIBIA FRACTURE SURGERY      There were no vitals filed for this visit.  Subjective Assessment - 12/15/17 1542    Subjective   The neurologist confirmed MS yesterday    Pertinent History  recently diagnosed with MS    Patient Stated Goals  to improve coordination, grip strength, and writing    Currently in Pain?  Yes    Pain Score  6     Pain Location  Arm down to hands    Pain Orientation  Right;Left    Pain Descriptors / Indicators  Aching    Pain  Type  Chronic pain;Acute pain recent hands    Pain Onset  More than a month ago    Pain Frequency  Constant    Aggravating Factors   cold weather, early am    Pain Relieving Factors  pain meds reduces        [facility] OT Assessment - 12/15/17 0001      Assessment   Medical Diagnosis  MS referral states demyelinating dz but MS confirmed by neurologist    Referring Provider  -- hospitalist referred pt, but will send eval to neurologist (Dr. Marjory Lies)   Onset Date/Surgical Date  -- symptoms since 2016, but just recently diagnosed w/ MS    Hand Dominance  Right    Next MD Visit  June 2019 saw neurologist 12/14/17    Prior Therapy  Acute care      Balance Screen   Has the patient fallen in the past 6 months  No    Has the patient had a decrease in activity level because of a fear of falling?   Yes    Is the patient reluctant to leave their home because of a fear of falling?   No  Home  Environment   Bathroom Shower/Tub  Tub/Shower unit;Curtain    Additional Comments  Pt lives with 58 y.o. sister and 40 y.o. son. Husband drives trucks and is gone a lot. Pt lives in 1 story home, 3 steps to enter, no railing.     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    Leisure  arts/crafts      ADL   Eating/Feeding  Independent    Grooming  Independent    Upper Body Bathing  Modified independent extra time    Lower Body Bathing  Modified independent extra time    Upper Body Dressing  Increased time    Lower Body Dressing  Increased time difficulty with zippers, typing shoes    Toilet Transfer  Modified independent    Toileting -  Hygiene  Independent    Tub/Shower Transfer  Modified independent has to be careful      IADL   Shopping  Completely unable to shop since diagnosis, but could for small purchases    Light Housekeeping  -- only very  light cleaning    Meal Prep  -- has not yet attempted since diagnosis    Community Mobility  Drives own vehicle    Medication Management  Is responsible for taking medication in correct dosages at correct time    Financial Management  -- has most things set up for automatic withdrawl      Mobility   Mobility Status  Independent but feels off balance      Written Expression   Dominant Hand  Right      Vision - History   Baseline Vision  Wears glasses all the time, denies change     Activity Tolerance   Activity Tolerance Comments  Overall decreased endurance      Cognition   Cognition Comments  Pt reports memory impairments, difficulty concentrating, and processing info      Sensation   Hot/Cold  Impaired by gross assessment    Additional Comments  bilateral hands constantly numb, pt can detect light touch but inconsistent with localization      Coordination   9 Hole Peg Test  Right;Left    Right 9 Hole Peg Test  41.15 sec    Left 9 Hole Peg Test  41.22 sec      Edema   Edema  none observed but pt feels like they are swollen      ROM / Strength   AROM / PROM / Strength  AROM      AROM   Overall AROM Comments  BUE AROM WFL's but pain with movement (numbness, stiffness, hypersensitivity)       Hand Function   Right Hand Grip (lbs)  35 lbs    Left Hand Grip (lbs)  10 lbs                      OT Education - 12/15/17 1628    Education provided  Yes    Education Details  Avoiding heat/sweating, MS support group, discussion re: benefits of exercise but not overexerting    Person(s) Educated  Patient    Methods  Explanation    Comprehension  Verbalized understanding       OT Short Term Goals - 12/15/17 1634  OT SHORT TERM GOAL #1   Title  Independent with HEP for coordination, hand strength, UE ROM/endurance bilaterally    Time  4    Period  Weeks    Status  New    Target Date  01/15/18      OT SHORT TERM GOAL #2   Title  Pt to verbalize  understanding with energy conservation techniques    Time  4    Period  Weeks    Status  New      OT SHORT TERM GOAL #3   Title  Pt to report greater ease with dressing and bathing using A/E prn    Time  4    Period  Weeks    Status  New      OT SHORT TERM GOAL #4   Title  Pt to improve grip strength Lt hand by 8 lbs     Baseline  10 lbs    Time  4    Period  Weeks    Status  New      OT SHORT TERM GOAL #5   Title  Pt to improve bilateral coordination by reducing speed on 9 hole peg test by 5 sec.     Baseline  Rt/Lt = 41 sec    Time  4    Period  Weeks    Status  New        OT Long Term Goals - 12/15/17 1637      OT LONG TERM GOAL #1   Title  Pt to return to simple cooking tasks with rest breaks and A/E prn    Time  8    Period  Weeks    Status  New      OT LONG TERM GOAL #2   Title  Pt to return to light household cleaning/management with rest breaks and A/E prn    Time  8    Period  Weeks    Status  New      OT LONG TERM GOAL #3   Title  Pt to improve Rt grip strength to 40 lbs, and Lt grip strength to 25 lbs for opening jars/containers    Baseline  Rt = 35 lbs, Lt = 10 lbs    Time  8    Period  Weeks    Status  New      OT LONG TERM GOAL #4   Title  Pt to verbalize understanding with pain management strategies, safety considerations for lack of sensation, and general guidelines for MS    Time  8    Period  Weeks    Status  New            Plan - 12/15/17 1630    Clinical Impression Statement  Pt is a 32 y.o. female who presents to outpatient rehab with referral for demyelinating dz (suspected MS), but confirmed MS with neurologist yesterday. Pt however has been having symptoms since 2016. Pt presents with decreased strength, endurance, coordination, memory and cognition, and decreased balance.     Occupational Profile and client history currently impacting functional performance  Current limitations effect ADLS, IADLS, childcare, and pt had to  recently quit job.     Occupational performance deficits (Please refer to evaluation for details):  ADL's;IADL's;Work;Social Participation;Leisure;Other childcare needs    Rehab Potential  Good    OT Frequency  2x / week    OT Duration  8 weeks plus evaluation    OT Treatment/Interventions  Self-care/ADL training;DME and/or AE instruction;Splinting;Aquatic Therapy;Therapeutic activities;Therapeutic exercise;Cognitive remediation/compensation;Coping strategies training;Neuromuscular education;Functional Mobility Training;Passive range of motion;Visual/perceptual remediation/compensation;Energy conservation;Manual Therapy;Patient/family education    Plan  coordination and putty HEP for bilateral hands, if time, HEP for UE's w/ dowel    Clinical Decision Making  Limited treatment options, no task modification necessary    Recommended Other Services  P.T. services d/t decreased balance    Consulted and Agree with Plan of Care  Patient       Patient will benefit from skilled therapeutic intervention in order to improve the following deficits and impairments:  Decreased coordination, Decreased range of motion, Impaired flexibility, Decreased endurance, Impaired sensation, Decreased coping skills, Decreased activity tolerance, Decreased knowledge of precautions, Decreased balance, Impaired UE functional use, Pain, Decreased knowledge of use of DME, Decreased cognition, Decreased mobility, Decreased strength, Impaired vision/preception  Visit Diagnosis: Other lack of coordination - Plan: Ot plan of care cert/re-cert  Muscle weakness (generalized) - Plan: Ot plan of care cert/re-cert  Unsteadiness on feet - Plan: Ot plan of care cert/re-cert  Attention and concentration deficit - Plan: Ot plan of care cert/re-cert  Other disturbances of skin sensation - Plan: Ot plan of care cert/re-cert    Problem List Patient Active Problem List   Diagnosis Date Noted  . Demyelinating disease (HCC) 12/08/2017   . Tobacco abuse     Kelli Churn, OTR/L 12/15/2017, 4:43 PM  [facility] [address] [place], [place], [address] Phone: [phone]   Fax:  [phone]  Name: Samantha [name] MRN: [id number] Date of Birth: 01-Jul-1986

## 2017-12-15 NOTE — Telephone Encounter (Signed)
Please send referral for P.T. Services to outpatient neuro due to balance deficits

## 2017-12-16 DIAGNOSIS — G35 Multiple sclerosis: Secondary | ICD-10-CM | POA: Insufficient documentation

## 2017-12-19 ENCOUNTER — Telehealth: Payer: Self-pay | Admitting: Diagnostic Neuroimaging

## 2017-12-19 ENCOUNTER — Emergency Department (HOSPITAL_COMMUNITY)
Admission: EM | Admit: 2017-12-19 | Discharge: 2017-12-19 | Disposition: A | Discharge status: Home / Self Care | Payer: BLUE CROSS/BLUE SHIELD | Attending: Emergency Medicine | Admitting: Emergency Medicine

## 2017-12-19 ENCOUNTER — Encounter (HOSPITAL_COMMUNITY): Payer: Self-pay | Admitting: Emergency Medicine

## 2017-12-19 ENCOUNTER — Ambulatory Visit: Payer: BLUE CROSS/BLUE SHIELD | Admitting: Family Medicine

## 2017-12-19 ENCOUNTER — Other Ambulatory Visit: Payer: Self-pay

## 2017-12-19 DIAGNOSIS — Z79899 Other long term (current) drug therapy: Secondary | ICD-10-CM | POA: Insufficient documentation

## 2017-12-19 DIAGNOSIS — R42 Dizziness and giddiness: Secondary | ICD-10-CM | POA: Diagnosis present

## 2017-12-19 DIAGNOSIS — G35 Multiple sclerosis: Secondary | ICD-10-CM | POA: Insufficient documentation

## 2017-12-19 DIAGNOSIS — F17210 Nicotine dependence, cigarettes, uncomplicated: Secondary | ICD-10-CM | POA: Insufficient documentation

## 2017-12-19 LAB — HEPATITIS B SURFACE ANTIGEN: Hepatitis B Surface Ag: NEGATIVE

## 2017-12-19 LAB — QUANTIFERON-TB GOLD PLUS
QuantiFERON Mitogen Value: 9.88 IU/mL
QuantiFERON Nil Value: 0.08 IU/mL
QuantiFERON TB1 Ag Value: 0.12 IU/mL
QuantiFERON TB2 Ag Value: 0.15 IU/mL
QuantiFERON-TB Gold Plus: NEGATIVE

## 2017-12-19 LAB — VITAMIN D 25 HYDROXY (VIT D DEFICIENCY, FRACTURES): Vit D, 25-Hydroxy: 10.1 ng/mL — ABNORMAL LOW (ref 30.0–100.0)

## 2017-12-19 LAB — HEPATITIS B SURFACE ANTIBODY,QUALITATIVE: HEP B SURFACE AB, QUAL: REACTIVE

## 2017-12-19 LAB — POC URINE PREG, ED: PREG TEST UR: NEGATIVE

## 2017-12-19 LAB — HEPATITIS B CORE ANTIBODY, TOTAL: HEP B C TOTAL AB: NEGATIVE

## 2017-12-19 LAB — PREGNANCY, URINE: PREG TEST UR: NEGATIVE

## 2017-12-19 MED ORDER — PREDNISONE 10 MG PO TABS: ORAL_TABLET | ORAL | 0 refills | Status: DC

## 2017-12-19 NOTE — ED Triage Notes (Addendum)
Pt here for dizzi ness states was just dx with MS on 3/7 and called to see her neurologist and cannot see her till wed, states any time she moves her head she is dizzy states same s/s as before

## 2017-12-19 NOTE — ED Provider Notes (Signed)
Patient placed in Quick Look pathway, seen and evaluated   Chief Complaint: dizziness  HPI:   Dizziness described as "things moving" and feeling off balance and like falling down x 2 days, has had similar dizziness for 1-2 weeks but never this bad. Aggravating factors include moving head and walking. Alleviating factors include rest. New diagnosis of MS on 3/7 discharged on 3/11. Had extensive labs, CT head, MRI last week while in hospital. Neurologist unable to see pt until Wednesday.   ROS: No headache, blurred or double vision, nausea, vomiting, CP, SOB, palpitations, syncope.   Physical Exam:   Gen: No distress  Neuro: Awake and Alert  Skin: Warm    Focused Exam:  RRR. Lungs CTAB.  Alert and oriented to self, place, time and event.  Speech is fluent without obvious dysarthria or dysphasia. Strength 5/5 with hand grip and ankle F/E.   Sensation to light touch intact in RIGHT hand and foot. Decreased sensation to light touch in LEFT hand and foot (ongoing for 2 weeks).  Normal gait. No pronator drift.  Normal finger-to-nose and finger tapping.  CN I and VIII not tested. CN II-XII grossly intact bilaterally.  Knee DTR symmetric. No ankle clonus.   ?Vertigo. Will order EKG, urine pregnancy, CBG. Pt stable to return to waiting room. Initiation of care has begun. The patient has been counseled on the process, plan, and necessity for staying for the completion/evaluation, and the remainder of the medical screening examination    Liberty Handy, PA-C 12/19/17 1345    Melene Plan, DO 12/19/17 1524

## 2017-12-19 NOTE — Telephone Encounter (Signed)
Dr. Hyacinth Meeker at [facility] requesting a call back as soon as Dr. Marjory Lies is available at [phone]

## 2017-12-19 NOTE — Telephone Encounter (Signed)
I spoke with Dr. Hyacinth Meeker. I would consider discontinuing baclofen (if patient has started) and also consider prednisone dose pack. I will have my nurse reach out to patient for sooner follow up in clinic if needed.   Suanne Marker, MD 12/19/2017, 5:08 PM Certified in Neurology, Neurophysiology and Neuroimaging  [facility] [address] [place], [place] [address] [phone]

## 2017-12-19 NOTE — ED Provider Notes (Signed)
[facility] EMERGENCY DEPARTMENT Provider Note   CSN: [id number] Arrival date & time: 12/19/17  1312     History   Chief Complaint Chief Complaint  Patient presents with  . Dizziness    HPI Samantha [name] is a 32 y.o. female.  HPI  The patient is a 32 year old female, she has a known history of a recent workup for focal neurologic deficits and was found to have multiple sclerosis.  During the course of this workup she had both an MRI of the brain as well as the cervical spine and ultimately was found to have multiple lesions that appeared consistent with a demyelinating disease.  She was kept in the hospital for approximately 5 days and during that time received 1 g of Solu-Medrol daily for a total of 5 g and some improvement in her symptoms.  She was referred to see neurology in the outpatient setting, the soonest appointment she could make was for June approximately 3 months away.  She reports that over the last couple of days since being discharged home she has developed increasing amounts of a dizzy feeling and states that whenever she tries to change positions including standing up, changing positions in bed or any walking she has a fullness in her head and a feeling of dizziness which is poorly described but she states is somewhat like the room is spinning.  There is no vomiting, no chest pain or shortness of breath, no abdominal pain, no coughing, fever, diarrhea, dysuria.  She reports that she has no medications at home and was discharged without any need for ongoing medications in the short-term.  The patient denies any focal weakness or numbness of her arms or her legs  Past Medical History:  Diagnosis Date  . Arthritis   . Chicken pox   . Chlamydia 12/02/2011  . Depression   . Fracture of left leg 2009   removal metal plate in left leg 2016  . Frequent headaches   . Infection    UTI  . Kidney stone    Kidney stone  . Migraines   . Neuropathy   .  PCOS (polycystic ovarian syndrome)   . Tobacco abuse     Patient Active Problem List   Diagnosis Date Noted  . MS (multiple sclerosis) (HCC) 12/16/2017  . Demyelinating disease (HCC) 12/08/2017  . Tobacco abuse     Past Surgical History:  Procedure Laterality Date  . CESAREAN SECTION N/A 05/12/2013   Procedure: Primary cesarean section with delivery of baby boy at 0754.;  Surgeon: Freddrick March. Tenny Craw, MD;  Location: [facility];  Service: Obstetrics;  Laterality: N/A;  . REMOVAL OF IMPLANT Left 09/25/2015   Procedure: LEFT ANKLE REMOVAL OF DEEP  IMPLANT DEBRIDEMENT OF ANTERIOR MEDIAL ANKLE JOINT;  Surgeon: Toni Arthurs, MD;  Location: [facility];  Service: Orthopedics;  Laterality: Left;  . TIBIA FRACTURE SURGERY      OB History    Gravida Para Term Preterm AB Living   1 1 1     1    SAB TAB Ectopic Multiple Live Births           1       Home Medications    Prior to Admission medications   Medication Sig Start Date End Date Taking? Authorizing Provider  acetaminophen (TYLENOL) 500 MG tablet Take 500-1,000 mg by mouth every 6 (six) hours as needed (for pain).    Yes [provider]  DULoxetine (CYMBALTA) 30 MG  capsule Take 1 tablet daily for 1 week, then increase to 2 tablets daily. Patient taking differently: Take 30 mg by mouth 2 (two) times daily.  11/04/17  Yes Ardith Dark, MD  HYDROcodone-acetaminophen (NORCO/VICODIN) 5-325 MG tablet Take 1-2 tablets by mouth every 6 (six) hours as needed for moderate pain or severe pain. 12/12/17  Yes Delano Metz, MD  predniSONE (DELTASONE) 10 MG tablet Prednisone taper, 60mg  PO day 1, 50mg  PO day 2, 40mg  PO day3, 30mg  PO day 4, 20mg  PO day 5, 10mg  PO day 6 12/19/17   Eber Hong, MD    Family History Family History  Problem Relation Age of Onset  . Hypertension Other   . Diabetes Other   . Ulcers Mother   . GER disease Mother   . Asthma Mother   . Heart attack Mother   . Lupus Mother   . Cancer Father   . Drug  abuse Father   . GER disease Sister   . Asthma Brother   . Birth defects Maternal Grandfather   . Heart attack Maternal Grandfather   . High blood pressure Maternal Grandfather   . Stroke Maternal Grandfather   . Autism Son   . Learning disabilities Son     Social History Social History   Tobacco Use  . Smoking status: Current Every Day Smoker    Packs/day: 0.25    Years: 2.00    Pack years: 0.50    Types: Cigarettes  . Smokeless tobacco: Never Used  . Tobacco comment: quit with preg  Substance Use Topics  . Alcohol use: No    Comment: socially none since pregnancy  . Drug use: No     Allergies   Patient has no known allergies.   Review of Systems Review of Systems  All other systems reviewed and are negative.    Physical Exam Updated Vital Signs BP 130/89 (BP Location: Right Arm)   Pulse 77   Temp 98.8 F (37.1 C) (Oral)   Resp 16   SpO2 98%   Physical Exam  Constitutional: She appears well-developed and well-nourished. No distress.  HENT:  Head: Normocephalic and atraumatic.  Mouth/Throat: Oropharynx is clear and moist. No oropharyngeal exudate.  Eyes: Conjunctivae and EOM are normal. Pupils are equal, round, and reactive to light. Right eye exhibits no discharge. Left eye exhibits no discharge. No scleral icterus.  Neck: Normal range of motion. Neck supple. No JVD present. No thyromegaly present.  Cardiovascular: Normal rate, regular rhythm, normal heart sounds and intact distal pulses. Exam reveals no gallop and no friction rub.  No murmur heard. Pulmonary/Chest: Effort normal and breath sounds normal. No respiratory distress. She has no wheezes. She has no rales.  Abdominal: Soft. Bowel sounds are normal. She exhibits no distension and no mass. There is no tenderness.  Musculoskeletal: Normal range of motion. She exhibits no edema or tenderness.  Lymphadenopathy:    She has no cervical adenopathy.  Neurological: She is alert. Coordination normal.    Speech is clear, cranial nerves III through XII are normal without any facial asymmetry, normal sensation of the bilateral face, normal tongue protrusion, normal extraocular movements and pupillary exam.  Memory is intact, gait is normal, the patient has normal strength in all 4 extremities at the major muscle groups however she has some decreased sensation to the left arm and leg which is patchy.  There is normal finger-nose-finger, normal rapid alternating movements and no pronator drift.  Skin: Skin is warm and dry. No  rash noted. No erythema.  Psychiatric: She has a normal mood and affect. Her behavior is normal.  Nursing note and vitals reviewed.    ED Treatments / Results  Labs (all labs ordered are listed, but only abnormal results are displayed) Labs Reviewed  PREGNANCY, URINE  POC URINE PREG, ED  CBG MONITORING, ED    EKG  EKG Interpretation  Date/Time:  Monday December 19 2017 13:55:27 EDT Ventricular Rate:  84 PR Interval:  162 QRS Duration: 64 QT Interval:  354 QTC Calculation: 418 R Axis:   61 Text Interpretation:  Normal sinus rhythm Normal ECG since last tracing no significant change Confirmed by Eber Hong ([id number]) on 12/19/2017 3:16:00 PM       Radiology No results found.  Procedures Procedures (including critical care time)  Medications Ordered in ED Medications - No data to display   Initial Impression / Assessment and Plan / ED Course  I have reviewed the triage vital signs and the nursing notes.  Pertinent labs & imaging results that were available during my care of the patient were reviewed by me and considered in my medical decision making (see chart for details).  Clinical Course as of Dec 20 1715  Mon Dec 19, 2017  1644 The patient was actually seen by her neurologist 5 days ago, Dr. Marjory Lies who recommended potential treatments based on her further blood work.  Will discuss with him if available.  Discussed with her current neurologist  on-call who states steroids are not indicated at this time.  [BM]  1710 Discussed with Dr. Marjory Lies at 5:10 PM who recommends a prednisone Dosepak and follow-up, he will have the office reach out and set her up with an office appointment later in the week to further discuss her illness  [BM]    Clinical Course User Index [BM] Eber Hong, MD   The patient has no acute findings on exam that are not expected with the prior workup.  Unfortunately this patient has a terrible disease and her symptoms which are vague seem to be related to the same process.  Will discuss with neurology, the patient's vital signs are unremarkable, her EKG was unremarkable, she likely needs to have the neurology workup expedited as well.  Patient given expectations of ongoing illness, expressed understanding  Discontinued Baclofen at suggestion of Neurologist as this may be causing symptoms.  On repeat exam the pt is well appearing, tolerating [employer] meal without any difficulty - appreciative of advice and expedited f/u news.  Final Clinical Impressions(s) / ED Diagnoses   Final diagnoses:  Multiple sclerosis [facility])    ED Discharge Orders        Ordered    predniSONE (DELTASONE) 10 MG tablet     12/19/17 1715       Eber Hong, MD 12/19/17 1717

## 2017-12-19 NOTE — Discharge Instructions (Signed)
Prednisone daily for 6 days 60 mg day 1, 50 mg day 2, 40 mg day 3, 30 mg day 4, 20 mg day 5, 10 mg day 6  Seek medical attention for severe or worsening symptoms

## 2017-12-20 ENCOUNTER — Ambulatory Visit: Payer: BLUE CROSS/BLUE SHIELD | Admitting: Occupational Therapy

## 2017-12-20 ENCOUNTER — Telehealth: Payer: Self-pay | Admitting: Diagnostic Neuroimaging

## 2017-12-20 ENCOUNTER — Encounter: Payer: Self-pay | Admitting: Occupational Therapy

## 2017-12-20 DIAGNOSIS — R41840 Attention and concentration deficit: Secondary | ICD-10-CM

## 2017-12-20 DIAGNOSIS — R278 Other lack of coordination: Secondary | ICD-10-CM

## 2017-12-20 DIAGNOSIS — R2681 Unsteadiness on feet: Secondary | ICD-10-CM

## 2017-12-20 DIAGNOSIS — M6281 Muscle weakness (generalized): Secondary | ICD-10-CM

## 2017-12-20 DIAGNOSIS — R208 Other disturbances of skin sensation: Secondary | ICD-10-CM

## 2017-12-20 NOTE — Telephone Encounter (Signed)
Pt was seen at ED for MS exacerbation yesterday. Discharge notes states she is to follow up with Dr Demetrius Charity. Please call to schedule an appt with her, Dr Demetrius Charity does not have an appt open

## 2017-12-20 NOTE — Patient Instructions (Signed)
1. Grip Strengthening (Resistive Putty)   Squeeze putty using thumb and all fingers. Repeat _20___ times. Do __2__ sessions per day.   2. Roll putty into tube on table and pinch between each finger and thumb x 10 reps each. (can do ring and small finger together)     Copyright  VHI. All rights reserved.   

## 2017-12-20 NOTE — Telephone Encounter (Signed)
Spoke to pt and she states that she is better then what she was yesterday.  She is going to proceed with tysabri, so this p/w will be given to tina in intrafusion.   JCV negatinve Index 0.32   No apptment made at this time.

## 2017-12-20 NOTE — Telephone Encounter (Signed)
Agree with tysabri. Will sign rx and start process. -VRP

## 2017-12-20 NOTE — Telephone Encounter (Signed)
See other note

## 2017-12-21 NOTE — Therapy (Signed)
[facility] [address] [place], [place], [address] Phone: [phone]   Fax:  [phone]  Occupational Therapy Treatment  Patient Details  Name: Samantha [name] MRN: [id number] Date of Birth: [birth year] No Data Recorded  Encounter Date: 12/20/2017  OT End of Session - 12/20/17 1454    Visit Number  2    Number of Visits  17    Date for OT Re-Evaluation  02/14/18    OT Start Time  1451    OT Stop Time  1530    OT Time Calculation (min)  39 min       Past Medical History:  Diagnosis Date  . Arthritis   . Chicken pox   . Chlamydia 12/02/2011  . Depression   . Fracture of left leg 2009   removal metal plate in left leg 2016  . Frequent headaches   . Infection    UTI  . Kidney stone    Kidney stone  . Migraines   . Neuropathy   . PCOS (polycystic ovarian syndrome)   . Tobacco abuse     Past Surgical History:  Procedure Laterality Date  . CESAREAN SECTION N/A 05/12/2013   Procedure: Primary cesarean section with delivery of baby boy at 0754.;  Surgeon: Freddrick March. Tenny Craw, MD;  Location: [facility];  Service: Obstetrics;  Laterality: N/A;  . REMOVAL OF IMPLANT Left 09/25/2015   Procedure: LEFT ANKLE REMOVAL OF DEEP  IMPLANT DEBRIDEMENT OF ANTERIOR MEDIAL ANKLE JOINT;  Surgeon: Toni Arthurs, MD;  Location: [facility];  Service: Orthopedics;  Laterality: Left;  . TIBIA FRACTURE SURGERY      There were no vitals filed for this visit.  Subjective Assessment - 12/21/17 1634    Subjective   Pt reports exacerbation over weekend    Pertinent History  recently diagnosed with MS    Patient Stated Goals  to improve coordination, grip strength, and writing    Currently in Pain?  No/denies            Pt reports overheating over weekend and having an exacerbation. Pt is awaiting further instructions regarding infusion by neurologist office.               OT Education - 12/21/17 1635    Education provided  Yes    Education Details  Putty exercises, energy conservation techniques with handout    Person(s) Educated  Patient    Methods  Explanation;Demonstration;Verbal cues;Handout    Comprehension  Verbalized understanding;Returned demonstration;Verbal cues required       OT Short Term Goals - 12/15/17 1634      OT SHORT TERM GOAL #1   Title  Independent with HEP for coordination, hand strength, UE ROM/endurance bilaterally    Time  4    Period  Weeks    Status  New    Target Date  01/15/18      OT SHORT TERM GOAL #2   Title  Pt to verbalize understanding with energy conservation techniques    Time  4    Period  Weeks    Status  New      OT SHORT TERM GOAL #3   Title  Pt to report greater ease with dressing and bathing using A/E prn    Time  4    Period  Weeks    Status  New      OT SHORT TERM GOAL #4   Title  Pt to improve grip strength  Lt hand by 8 lbs     Baseline  10 lbs    Time  4    Period  Weeks    Status  New      OT SHORT TERM GOAL #5   Title  Pt to improve bilateral coordination by reducing speed on 9 hole peg test by 5 sec.     Baseline  Rt/Lt = 41 sec    Time  4    Period  Weeks    Status  New        OT Long Term Goals - 12/15/17 1637      OT LONG TERM GOAL #1   Title  Pt to return to simple cooking tasks with rest breaks and A/E prn    Time  8    Period  Weeks    Status  New      OT LONG TERM GOAL #2   Title  Pt to return to light household cleaning/management with rest breaks and A/E prn    Time  8    Period  Weeks    Status  New      OT LONG TERM GOAL #3   Title  Pt to improve Rt grip strength to 40 lbs, and Lt grip strength to 25 lbs for opening jars/containers    Baseline  Rt = 35 lbs, Lt = 10 lbs    Time  8    Period  Weeks    Status  New      OT LONG TERM GOAL #4   Title  Pt to verbalize understanding with pain management strategies, safety considerations for lack of sensation, and general guidelines for MS     Time  8    Period  Weeks    Status  New            Plan - 12/21/17 1635    Clinical Impression Statement  Pt reports MS exacerbation over weekend. MD put her on steroids, she is doing better now.    Rehab Potential  Good    OT Frequency  2x / week    OT Duration  8 weeks    OT Treatment/Interventions  Self-care/ADL training;DME and/or AE instruction;Splinting;Aquatic Therapy;Therapeutic activities;Therapeutic exercise;Cognitive remediation/compensation;Coping strategies training;Neuromuscular education;Functional Mobility Training;Passive range of motion;Visual/perceptual remediation/compensation;Energy conservation;Manual Therapy;Patient/family education    Plan  coordination HEP, HEP for UE's    OT Home Exercise Plan  putty, energy conservation    Consulted and Agree with Plan of Care  Patient       Patient will benefit from skilled therapeutic intervention in order to improve the following deficits and impairments:  Decreased coordination, Decreased range of motion, Impaired flexibility, Decreased endurance, Impaired sensation, Decreased coping skills, Decreased activity tolerance, Decreased knowledge of precautions, Decreased balance, Impaired UE functional use, Pain, Decreased knowledge of use of DME, Decreased cognition, Decreased mobility, Decreased strength, Impaired vision/preception  Visit Diagnosis: Other lack of coordination  Muscle weakness (generalized)  Attention and concentration deficit  Other disturbances of skin sensation  Unsteadiness on feet    Problem List Patient Active Problem List   Diagnosis Date Noted  . MS (multiple sclerosis) (HCC) 12/16/2017  . Demyelinating disease (HCC) 12/08/2017  . Tobacco abuse     RINE,KATHRYN 12/21/2017, 4:37 PM Keene Breath, OTR/L [phone] Phone: [phone] 4:38 PM 12/21/17 [facility] [address] [place], [place], [address] Phone:  [phone]   Fax:  [phone]  Name: ILSA GILLEM  MRN: [id number] Date of Birth: 12/29/1985

## 2017-12-22 ENCOUNTER — Ambulatory Visit: Payer: BLUE CROSS/BLUE SHIELD | Admitting: Occupational Therapy

## 2017-12-22 DIAGNOSIS — R278 Other lack of coordination: Secondary | ICD-10-CM | POA: Diagnosis not present

## 2017-12-22 DIAGNOSIS — M6281 Muscle weakness (generalized): Secondary | ICD-10-CM

## 2017-12-22 NOTE — Therapy (Signed)
[facility] [address] [place], [place], [address] Phone: [phone]   Fax:  [phone]  Occupational Therapy Treatment  Patient Details  Name: Samantha [name] MRN: [id number] Date of Birth: [birth year] No data recorded  Encounter Date: 12/22/2017  OT End of Session - 12/22/17 1206    Visit Number  3    Number of Visits  17    Date for OT Re-Evaluation  02/14/18    Authorization Type  BC/BS    OT Start Time  1100    OT Stop Time  1145    OT Time Calculation (min)  45 min    Activity Tolerance  Patient tolerated treatment well       Past Medical History:  Diagnosis Date  . Arthritis   . Chicken pox   . Chlamydia 12/02/2011  . Depression   . Fracture of left leg 2009   removal metal plate in left leg 6144  . Frequent headaches   . Infection    UTI  . Kidney stone    Kidney stone  . Migraines   . Neuropathy   . PCOS (polycystic ovarian syndrome)   . Tobacco abuse     Past Surgical History:  Procedure Laterality Date  . CESAREAN SECTION N/A 05/12/2013   Procedure: Primary cesarean section with delivery of baby boy at 0754.;  Surgeon: Farrel Gobble. Harrington Challenger, MD;  Location: [facility];  Service: Obstetrics;  Laterality: N/A;  . REMOVAL OF IMPLANT Left 09/25/2015   Procedure: LEFT ANKLE REMOVAL OF DEEP  IMPLANT DEBRIDEMENT OF ANTERIOR MEDIAL ANKLE JOINT;  Surgeon: Wylene Simmer, MD;  Location: [place];  Service: Orthopedics;  Laterality: Left;  . TIBIA FRACTURE SURGERY      There were no vitals filed for this visit.  Subjective Assessment - 12/22/17 1118    Pertinent History  recently diagnosed with MS    Patient Stated Goals  to improve coordination, grip strength, and writing    Currently in Pain?  Yes    Pain Score  4     Pain Location  Leg    Pain Orientation  Right    Pain Descriptors / Indicators  Sore    Pain Type  Chronic pain    Pain Frequency  Intermittent    Aggravating Factors    cold, standing too long    Pain Relieving Factors  laying down                   OT Treatments/Exercises (OP) - 12/22/17 0001      ADLs   ADL Comments  Reviewed energy conservation techniques and avoiding overfatiguing and heat/sweating     Exercises   Exercises  Shoulder;Hand      Shoulder Exercises: ROM/Strengthening   Other ROM/Strengthening Exercises  Pt issued cane HEP and demo each x 10 reps - see pt instructions for details      Hand Exercises   Other Hand Exercises  Pt issued coordination HEP and demo each - see pt instructions for details             OT Education - 12/22/17 1144    Education provided  Yes    Education Details  Coordination HEP, UE HEP    Person(s) Educated  Patient    Methods  Explanation;Demonstration;Handout    Comprehension  Verbalized understanding;Returned demonstration       OT Short Term Goals - 12/22/17 1206      OT SHORT  TERM GOAL #1   Title  Independent with HEP for coordination, hand strength, UE ROM/endurance bilaterally    Time  4    Period  Weeks    Status  Achieved      OT SHORT TERM GOAL #2   Title  Pt to verbalize understanding with energy conservation techniques    Time  4    Period  Weeks    Status  Achieved      OT SHORT TERM GOAL #3   Title  Pt to report greater ease with dressing and bathing using A/E prn    Time  4    Period  Weeks    Status  On-going      OT SHORT TERM GOAL #4   Title  Pt to improve grip strength Lt hand by 8 lbs     Baseline  10 lbs    Time  4    Period  Weeks    Status  On-going      OT SHORT TERM GOAL #5   Title  Pt to improve bilateral coordination by reducing speed on 9 hole peg test by 5 sec.     Baseline  Rt/Lt = 41 sec    Time  4    Period  Weeks    Status  On-going        OT Long Term Goals - 12/15/17 1637      OT LONG TERM GOAL #1   Title  Pt to return to simple cooking tasks with rest breaks and A/E prn    Time  8    Period  Weeks    Status  New       OT LONG TERM GOAL #2   Title  Pt to return to light household cleaning/management with rest breaks and A/E prn    Time  8    Period  Weeks    Status  New      OT LONG TERM GOAL #3   Title  Pt to improve Rt grip strength to 40 lbs, and Lt grip strength to 25 lbs for opening jars/containers    Baseline  Rt = 35 lbs, Lt = 10 lbs    Time  8    Period  Weeks    Status  New      OT LONG TERM GOAL #4   Title  Pt to verbalize understanding with pain management strategies, safety considerations for lack of sensation, and general guidelines for MS    Time  8    Period  Weeks    Status  New            Plan - 12/22/17 1207    Clinical Impression Statement  Pt met STG'S #1 and #2. Pt with greater understanding of ways to help symptoms of MS and things to avoid    Occupational Profile and client history currently impacting functional performance  Current limitations effect ADLS, IADLS, childcare, and pt had to recently quit job.     Rehab Potential  Good    OT Frequency  2x / week    OT Duration  8 weeks    OT Treatment/Interventions  Self-care/ADL training;DME and/or AE instruction;Splinting;Aquatic Therapy;Therapeutic activities;Therapeutic exercise;Cognitive remediation/compensation;Coping strategies training;Neuromuscular education;Functional Mobility Training;Passive range of motion;Visual/perceptual remediation/compensation;Energy conservation;Manual Therapy;Patient/family education    Plan  A/E recommendations, continue grip strength and coordination, safety considerations for lack of sensation       Patient will benefit from skilled therapeutic intervention in  order to improve the following deficits and impairments:  Decreased coordination, Decreased range of motion, Impaired flexibility, Decreased endurance, Impaired sensation, Decreased coping skills, Decreased activity tolerance, Decreased knowledge of precautions, Decreased balance, Impaired UE functional use, Pain, Decreased  knowledge of use of DME, Decreased cognition, Decreased mobility, Decreased strength, Impaired vision/preception  Visit Diagnosis: Other lack of coordination  Muscle weakness (generalized)    Problem List Patient Active Problem List   Diagnosis Date Noted  . MS (multiple sclerosis) ([place]) 12/16/2017  . Demyelinating disease ([place]) 12/08/2017  . Tobacco abuse     Carey Bullocks, OTR/L 12/22/2017, 12:09 PM  [facility] [address] [place], [place], [address] Phone: [phone]   Fax:  [phone]  Name: Samantha [name] MRN: [id number] Date of Birth: 06-28-1986

## 2017-12-22 NOTE — Patient Instructions (Signed)
  Coordination Activities  Perform the following activities for 15 minutes 1 times per day with both hand(s).   Rotate ball in fingertips (clockwise and counter-clockwise x 3 revolutions each way).  Flip cards 1 at a time as fast as you can (1/2 deck with each hand).  Deal cards with your thumb (Hold deck in hand and push card off top with thumb).  Rotate one card in hand (clockwise and counter-clockwise).  Pick up coins one at a time until you get 5 in your hand, then move coins from palm to fingertips to stack one at a time.  Practice writing with Rt hand only.   SELF ASSISTED WITH OBJECT: Shoulder Flexion - Sitting    Hold cane with both hands. Raise arms up. _10__ reps per set, _2__ sets per day,   Abduction (Eccentric) - Active (Cane)    Lift cane out to side with affected arm. Avoid hiking shoulder. Keep palm relaxed. Slowly lower affected arm for 3-5 seconds. _10__ reps per set, _2__ sets per day,  ROM: Extension - Wand (Standing)    Stand holding wand behind back, palms FORWARD. Raise arms as far as possible. Repeat _10___ times per set.  Do __2__ sessions per day.

## 2017-12-27 ENCOUNTER — Ambulatory Visit: Payer: BLUE CROSS/BLUE SHIELD | Admitting: Occupational Therapy

## 2017-12-27 DIAGNOSIS — R278 Other lack of coordination: Secondary | ICD-10-CM | POA: Diagnosis not present

## 2017-12-27 DIAGNOSIS — M6281 Muscle weakness (generalized): Secondary | ICD-10-CM

## 2017-12-27 DIAGNOSIS — R208 Other disturbances of skin sensation: Secondary | ICD-10-CM

## 2017-12-27 NOTE — Therapy (Signed)
[facility] [address] [place], [place], [address] Phone: [phone]   Fax:  [phone]  Occupational Therapy Treatment  Patient Details  Name: Samantha [name] MRN: [id number] Date of Birth: 07-04-1986 No data recorded  Encounter Date: 12/27/2017  OT End of Session - 12/27/17 1305    Visit Number  4    Number of Visits  17    Date for OT Re-Evaluation  02/14/18    Authorization Type  BC/BS    OT Start Time  1100    OT Stop Time  1148    OT Time Calculation (min)  48 min    Activity Tolerance  Patient tolerated treatment well    Behavior During Therapy  [facility] for tasks assessed/performed       Past Medical History:  Diagnosis Date  . Arthritis   . Chicken pox   . Chlamydia 12/02/2011  . Depression   . Fracture of left leg 2009   removal metal plate in left leg 2016  . Frequent headaches   . Infection    UTI  . Kidney stone    Kidney stone  . Migraines   . Neuropathy   . PCOS (polycystic ovarian syndrome)   . Tobacco abuse     Past Surgical History:  Procedure Laterality Date  . CESAREAN SECTION N/A 05/12/2013   Procedure: Primary cesarean section with delivery of baby boy at 0754.;  Surgeon: Freddrick March. Tenny Craw, MD;  Location: [facility];  Service: Obstetrics;  Laterality: N/A;  . REMOVAL OF IMPLANT Left 09/25/2015   Procedure: LEFT ANKLE REMOVAL OF DEEP  IMPLANT DEBRIDEMENT OF ANTERIOR MEDIAL ANKLE JOINT;  Surgeon: Toni Arthurs, MD;  Location: [facility];  Service: Orthopedics;  Laterality: Left;  . TIBIA FRACTURE SURGERY      There were no vitals filed for this visit.  Subjective Assessment - 12/27/17 1110    Subjective   I just finished the prednisone yesterday and I feel like the tingling and stiffness are worse in my hands now    Pertinent History  recently diagnosed with MS    Patient Stated Goals  to improve coordination, grip strength, and writing    Currently in Pain?  Yes    Pain  Score  5     Pain Location  Hand and Rt arm    Pain Orientation  Right;Left    Pain Descriptors / Indicators  Sore;Tingling stiffness    Pain Frequency  Intermittent    Aggravating Factors   nothing    Pain Relieving Factors  nothing                   OT Treatments/Exercises (OP) - 12/27/17 0001      ADLs   ADL Comments  Discussed safety considerations, task modifications/adaptations, and potential A/E needs due to lack of sensation, decr. coordination and grip strength in UE's including things like: buying foods already chopped or using chopper or cut resistant gloves; using burn resistant glove or having family member take things off stove or out of oven or using smaller dishes; using travel mug to carry liquid beverages, using plastic dishes vs. breakable dishes, using loose comfortable clothing. Pt was also shown button hook and demo how to use. Pt shown different options for tub transfers including tub bench or shower chair and grab bars for fall prevention, and options for shoes and alternatives to tying shoes.       Hand Exercises  Other Hand Exercises  Gripper set at level 1 resistance to pick up blocks for sustained grip strength (1/2 amount with Rt hand, then 1/2 amt with Lt hand) with more difficulty Lt hand             OT Education - 12/27/17 1305    Education provided  Yes    Education Details  A/E recommendations, safety considerations, task modifications    Person(s) Educated  Patient    Methods  Explanation;Handout    Comprehension  Verbalized understanding       OT Short Term Goals - 12/22/17 1206      OT SHORT TERM GOAL #1   Title  Independent with HEP for coordination, hand strength, UE ROM/endurance bilaterally    Time  4    Period  Weeks    Status  Achieved      OT SHORT TERM GOAL #2   Title  Pt to verbalize understanding with energy conservation techniques    Time  4    Period  Weeks    Status  Achieved      OT SHORT TERM GOAL #3    Title  Pt to report greater ease with dressing and bathing using A/E prn    Time  4    Period  Weeks    Status  On-going      OT SHORT TERM GOAL #4   Title  Pt to improve grip strength Lt hand by 8 lbs     Baseline  10 lbs    Time  4    Period  Weeks    Status  On-going      OT SHORT TERM GOAL #5   Title  Pt to improve bilateral coordination by reducing speed on 9 hole peg test by 5 sec.     Baseline  Rt/Lt = 41 sec    Time  4    Period  Weeks    Status  On-going        OT Long Term Goals - 12/15/17 1637      OT LONG TERM GOAL #1   Title  Pt to return to simple cooking tasks with rest breaks and A/E prn    Time  8    Period  Weeks    Status  New      OT LONG TERM GOAL #2   Title  Pt to return to light household cleaning/management with rest breaks and A/E prn    Time  8    Period  Weeks    Status  New      OT LONG TERM GOAL #3   Title  Pt to improve Rt grip strength to 40 lbs, and Lt grip strength to 25 lbs for opening jars/containers    Baseline  Rt = 35 lbs, Lt = 10 lbs    Time  8    Period  Weeks    Status  New      OT LONG TERM GOAL #4   Title  Pt to verbalize understanding with pain management strategies, safety considerations for lack of sensation, and general guidelines for MS    Time  8    Period  Weeks    Status  New            Plan - 12/27/17 1306    Clinical Impression Statement  Pt continues to make progress towards remaining STG's.     Occupational Profile and client history currently impacting functional performance  Current limitations effect ADLS, IADLS, childcare, and pt had to recently quit job.     Rehab Potential  Good    OT Frequency  2x / week    OT Duration  8 weeks    OT Treatment/Interventions  Self-care/ADL training;DME and/or AE instruction;Splinting;Aquatic Therapy;Therapeutic activities;Therapeutic exercise;Cognitive remediation/compensation;Coping strategies training;Neuromuscular education;Functional Mobility  Training;Passive range of motion;Visual/perceptual remediation/compensation;Energy conservation;Manual Therapy;Patient/family education    Plan  continue to reinforce safety considerations, and A/E recommendations and task modifications for ADLS, coordination bilaterally, check on PT referral and if in - schedule PT eval    Consulted and Agree with Plan of Care  Patient       Patient will benefit from skilled therapeutic intervention in order to improve the following deficits and impairments:  Decreased coordination, Decreased range of motion, Impaired flexibility, Decreased endurance, Impaired sensation, Decreased coping skills, Decreased activity tolerance, Decreased knowledge of precautions, Decreased balance, Impaired UE functional use, Pain, Decreased knowledge of use of DME, Decreased cognition, Decreased mobility, Decreased strength, Impaired vision/preception  Visit Diagnosis: Muscle weakness (generalized)  Other lack of coordination  Other disturbances of skin sensation    Problem List Patient Active Problem List   Diagnosis Date Noted  . MS (multiple sclerosis) (HCC) 12/16/2017  . Demyelinating disease (HCC) 12/08/2017  . Tobacco abuse     Kelli Churn, OTR/L 12/27/2017, 1:08 PM  [facility] [address] [place], [place], [address] Phone: [phone]   Fax:  [phone]  Name: Samantha [name] MRN: [id number] Date of Birth: 07-25-1986

## 2017-12-28 ENCOUNTER — Other Ambulatory Visit: Payer: Self-pay

## 2017-12-28 MED ORDER — DULOXETINE HCL 30 MG PO CPEP: 60.0000 mg | ORAL_CAPSULE | Freq: Every day | ORAL | 3 refills | Status: DC

## 2017-12-28 NOTE — Telephone Encounter (Signed)
Ibuprofen and tylenol for pain and stiffness issues. -VRP

## 2017-12-28 NOTE — Telephone Encounter (Signed)
Patient received call from [facility] stating a PA is needed for Tysabri. Until she gets Tysabri she would like a Rx for pain medication called to [facility] on [address]. Please call and advise.

## 2017-12-28 NOTE — Telephone Encounter (Addendum)
I spoke to Samantha [name] in intrafusion and she will have to look up pt in the system.  I LMVM for pt that returning her call.

## 2017-12-28 NOTE — Telephone Encounter (Signed)
Spoke to pt she stated that when she had 2 days left of the last prednisone, she started to note stiffness in her hand, pain, weakness, not being able to grip, tremor.  This has progressively gotten worse.  I spoke to Inetta Fermo, and it looks like they are reaching out to specialty pharmacy and copay assistance thru [employer].  So hopefully will hear soon.  I got more specific sx on pt.  Having spasms that she feels like comes and goes.  Leg and arms sore, with some weakness, has whole body tremor and is stuttering a little.  She stated that the baclofen helped some.  She is getting OT tomorrow and will start PT next week.  What would you suggest?

## 2017-12-29 ENCOUNTER — Ambulatory Visit: Payer: BLUE CROSS/BLUE SHIELD | Admitting: Occupational Therapy

## 2017-12-29 DIAGNOSIS — R41840 Attention and concentration deficit: Secondary | ICD-10-CM

## 2017-12-29 DIAGNOSIS — R278 Other lack of coordination: Secondary | ICD-10-CM

## 2017-12-29 DIAGNOSIS — R208 Other disturbances of skin sensation: Secondary | ICD-10-CM

## 2017-12-29 DIAGNOSIS — M6281 Muscle weakness (generalized): Secondary | ICD-10-CM

## 2017-12-29 MED ORDER — TIZANIDINE HCL 2 MG PO CAPS: 2.0000 mg | ORAL_CAPSULE | Freq: Three times a day (TID) | ORAL | 2 refills | Status: DC

## 2017-12-29 NOTE — Addendum Note (Signed)
Addended by: Guy Begin on: 12/29/2017 05:26 PM   Modules accepted: Orders

## 2017-12-29 NOTE — Therapy (Signed)
[facility] [address] [place], [place], [address] Phone: [phone]   Fax:  [phone]  Occupational Therapy Treatment  Patient Details  Name: Samantha [name] MRN: [id number] Date of Birth: March 28, 1986 No data recorded  Encounter Date: 12/29/2017  OT End of Session - 12/29/17 1155    Visit Number  5    Number of Visits  17    Date for OT Re-Evaluation  02/14/18    Authorization Type  BC/BS    OT Start Time  1105    OT Stop Time  1145    OT Time Calculation (min)  40 min    Activity Tolerance  Patient tolerated treatment well    Behavior During Therapy  [facility] for tasks assessed/performed       Past Medical History:  Diagnosis Date  . Arthritis   . Chicken pox   . Chlamydia 12/02/2011  . Depression   . Fracture of left leg 2009   removal metal plate in left leg 2016  . Frequent headaches   . Infection    UTI  . Kidney stone    Kidney stone  . Migraines   . Neuropathy   . PCOS (polycystic ovarian syndrome)   . Tobacco abuse     Past Surgical History:  Procedure Laterality Date  . CESAREAN SECTION N/A 05/12/2013   Procedure: Primary cesarean section with delivery of baby boy at 0754.;  Surgeon: Freddrick March. Tenny Craw, MD;  Location: [facility];  Service: Obstetrics;  Laterality: N/A;  . REMOVAL OF IMPLANT Left 09/25/2015   Procedure: LEFT ANKLE REMOVAL OF DEEP  IMPLANT DEBRIDEMENT OF ANTERIOR MEDIAL ANKLE JOINT;  Surgeon: Toni Arthurs, MD;  Location: [facility];  Service: Orthopedics;  Laterality: Left;  . TIBIA FRACTURE SURGERY      There were no vitals filed for this visit.  Subjective Assessment - 12/29/17 1115    Subjective   I feel worse again - my neck and Rt arm are sore, and my hands are stiff and numb in my fingertips. I still haven't heard from the Dr.     Pertinent History  recently diagnosed with MS    Patient Stated Goals  to improve coordination, grip strength, and writing    Currently  in Pain?  Yes    Pain Score  7     Pain Location  Arm Rt neck down to hand, and Lt hand    Pain Orientation  Right    Pain Descriptors / Indicators  Sore stiffness in hands    Pain Type  Chronic pain    Pain Onset  More than a month ago    Pain Frequency  Intermittent    Aggravating Factors   nothing    Pain Relieving Factors  nothing                   OT Treatments/Exercises (OP) - 12/29/17 0001      ADLs   ADL Comments  Reviewed task modifications, A/E recommendations, necessity and importance of task and getting help when needed.       Exercises   Exercises  -- UBE x 5 min. Level 1 for UB conditioning/endurance      Hand Exercises   Other Hand Exercises  Pt placing small pegs in pegboard Rt hand, then Lt hand to copy peg design for coordination and visual perceptual skills: Pt with min difficulty using hands and min to mod drops especially Lt hand, but  copied design with 100% accuracy               OT Short Term Goals - 12/22/17 1206      OT SHORT TERM GOAL #1   Title  Independent with HEP for coordination, hand strength, UE ROM/endurance bilaterally    Time  4    Period  Weeks    Status  Achieved      OT SHORT TERM GOAL #2   Title  Pt to verbalize understanding with energy conservation techniques    Time  4    Period  Weeks    Status  Achieved      OT SHORT TERM GOAL #3   Title  Pt to report greater ease with dressing and bathing using A/E prn    Time  4    Period  Weeks    Status  On-going      OT SHORT TERM GOAL #4   Title  Pt to improve grip strength Lt hand by 8 lbs     Baseline  10 lbs    Time  4    Period  Weeks    Status  On-going      OT SHORT TERM GOAL #5   Title  Pt to improve bilateral coordination by reducing speed on 9 hole peg test by 5 sec.     Baseline  Rt/Lt = 41 sec    Time  4    Period  Weeks    Status  On-going        OT Long Term Goals - 12/15/17 1637      OT LONG TERM GOAL #1   Title  Pt to return to  simple cooking tasks with rest breaks and A/E prn    Time  8    Period  Weeks    Status  New      OT LONG TERM GOAL #2   Title  Pt to return to light household cleaning/management with rest breaks and A/E prn    Time  8    Period  Weeks    Status  New      OT LONG TERM GOAL #3   Title  Pt to improve Rt grip strength to 40 lbs, and Lt grip strength to 25 lbs for opening jars/containers    Baseline  Rt = 35 lbs, Lt = 10 lbs    Time  8    Period  Weeks    Status  New      OT LONG TERM GOAL #4   Title  Pt to verbalize understanding with pain management strategies, safety considerations for lack of sensation, and general guidelines for MS    Time  8    Period  Weeks    Status  New            Plan - 12/29/17 1156    Clinical Impression Statement  Pt making progress in coordination, and greater understanding of A/E needs and task modifications.     Occupational Profile and client history currently impacting functional performance  Current limitations effect ADLS, IADLS, childcare, and pt had to recently quit job.     Rehab Potential  Good    OT Frequency  2x / week    OT Duration  8 weeks    OT Treatment/Interventions  Self-care/ADL training;DME and/or AE instruction;Splinting;Aquatic Therapy;Therapeutic activities;Therapeutic exercise;Cognitive remediation/compensation;Coping strategies training;Neuromuscular education;Functional Mobility Training;Passive range of motion;Visual/perceptual remediation/compensation;Energy conservation;Manual Therapy;Patient/family education    Plan  standing with functional reaching and coordination tasks bilaterally, grip strength bilaterally, UBE     Recommended Other Services  Pt has P.T. evaluation scheduled for next Tuesday    Consulted and Agree with Plan of Care  Patient       Patient will benefit from skilled therapeutic intervention in order to improve the following deficits and impairments:  Decreased coordination, Decreased range of  motion, Impaired flexibility, Decreased endurance, Impaired sensation, Decreased coping skills, Decreased activity tolerance, Decreased knowledge of precautions, Decreased balance, Impaired UE functional use, Pain, Decreased knowledge of use of DME, Decreased cognition, Decreased mobility, Decreased strength, Impaired vision/preception  Visit Diagnosis: Other lack of coordination  Muscle weakness (generalized)  Other disturbances of skin sensation  Attention and concentration deficit    Problem List Patient Active Problem List   Diagnosis Date Noted  . MS (multiple sclerosis) (HCC) 12/16/2017  . Demyelinating disease (HCC) 12/08/2017  . Tobacco abuse     Kelli Churn, OTR/L 12/29/2017, 11:58 AM  [facility] [address] [place], [place], [address] Phone: [phone]   Fax:  [phone]  Name: Samantha [name] MRN: [id number] Date of Birth: 25-May-1986

## 2017-12-29 NOTE — Telephone Encounter (Signed)
Please call in tizanidine 2 mg po tid

## 2017-12-29 NOTE — Telephone Encounter (Signed)
Spoke to pt and let her know that Dr. Epimenio Foot prescirbed another type of muscle relaxer, for stiffness, spasms, start taking one tonight may help with sleep as SE drowsiness.  Tizanidine 2mg  caps po tid.  See how this does.  She will let us know.  Let know MD on call for practice over weekend.

## 2017-12-29 NOTE — Telephone Encounter (Signed)
I spoke to pt and relayed that Dr. Marjory Lies wanted her to take otc for the pain and stiffness.  She stated that she is having since this am neck numnbess R side and front and her head  Feels funny ( she could not explain what this meant).  Dr. Marjory Lies is out of the office.  She is concerned that if she worsens then will need to go back to ED, which I told her that if she is concerned, she can definitely do this.  Please advise.

## 2017-12-30 NOTE — Telephone Encounter (Signed)
Spoke with patient who stated she hasn't been notified by her pharmacy that new Rx is ready. She stated she feels "pretty much the same". She stated she is "okay except for the numbness in her neck".  This RN advised her that this office has a dr on call this weekend should she need the service.  She verbalized understanding, appreciation for call.

## 2018-01-01 ENCOUNTER — Telehealth: Payer: Self-pay | Admitting: Neurology

## 2018-01-01 MED ORDER — DEXAMETHASONE 2 MG PO TABS: ORAL_TABLET | ORAL | 0 refills | Status: DC

## 2018-01-01 NOTE — Telephone Encounter (Signed)
I called the patient.  The patient has had some problems with stuttering speech today.  Patient recently was diagnosed with multiple sclerosis.  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  The patient just came off of prednisone, I will give her 3 course of dexamethasone.  The current symptoms may be psychogenic.

## 2018-01-02 ENCOUNTER — Ambulatory Visit: Payer: BLUE CROSS/BLUE SHIELD | Admitting: Occupational Therapy

## 2018-01-02 ENCOUNTER — Ambulatory Visit: Payer: BLUE CROSS/BLUE SHIELD | Admitting: Physical Therapy

## 2018-01-03 ENCOUNTER — Telehealth: Payer: Self-pay | Admitting: Diagnostic Neuroimaging

## 2018-01-03 ENCOUNTER — Other Ambulatory Visit: Payer: Self-pay

## 2018-01-03 MED ORDER — DULOXETINE HCL 30 MG PO CPEP: 60.0000 mg | ORAL_CAPSULE | Freq: Every day | ORAL | 0 refills | Status: DC

## 2018-01-03 NOTE — Telephone Encounter (Signed)
Jared from [employer] there was a PA on the Tysabri for pt but it was denied.  Jilda Panda states there has not been an appeal and he would like to know what are the plans to get pt her medication. Please call [phone] option 2

## 2018-01-03 NOTE — Telephone Encounter (Addendum)
Spoke with Inetta Fermo RN, infusion suite. She stated that the [employer] will do appeal on patient's Tysabri. She stated they were informed yesterday that it needs an appeal.  [employer], spoke with Inetta Fermo H and informed her of above . She stated she made note and would let Jared know. She verbalized understanding, appreciation for call.

## 2018-01-04 ENCOUNTER — Ambulatory Visit: Payer: BLUE CROSS/BLUE SHIELD | Attending: Nephrology | Admitting: Occupational Therapy

## 2018-01-04 DIAGNOSIS — R2681 Unsteadiness on feet: Secondary | ICD-10-CM

## 2018-01-04 DIAGNOSIS — M6281 Muscle weakness (generalized): Secondary | ICD-10-CM

## 2018-01-04 DIAGNOSIS — R208 Other disturbances of skin sensation: Secondary | ICD-10-CM | POA: Insufficient documentation

## 2018-01-04 DIAGNOSIS — R2689 Other abnormalities of gait and mobility: Secondary | ICD-10-CM | POA: Diagnosis present

## 2018-01-04 DIAGNOSIS — R278 Other lack of coordination: Secondary | ICD-10-CM | POA: Insufficient documentation

## 2018-01-04 NOTE — Telephone Encounter (Signed)
Spoke with Inetta Fermo RN in infusion room. She stated that all office notes and MRI reports were faxed for review. She advised patient be told this. This RN called patient and reassured her that all supporting documents have been faxed. Also advised her this RN faxed another form this afternoon with her diagnosis. Advised she will get a call when the infusion RN receives approval. Patient verbalized understanding, appreciation of call.

## 2018-01-04 NOTE — Therapy (Signed)
[facility] [address] [place], [place], [address] Phone: [phone]   Fax:  [phone]  Occupational Therapy Treatment  Patient Details  Name: Samantha [name] MRN: [id number] Date of Birth: [birth year] No data recorded  Encounter Date: 01/04/2018  OT End of Session - 01/04/18 1135    Visit Number  6    Number of Visits  17    Date for OT Re-Evaluation  02/14/18    Authorization Type  BC/BS    OT Start Time  1100    OT Stop Time  1142    OT Time Calculation (min)  42 min    Activity Tolerance  Patient tolerated treatment well    Behavior During Therapy  [facility] for tasks assessed/performed       Past Medical History:  Diagnosis Date  . Arthritis   . Chicken pox   . Chlamydia 12/02/2011  . Depression   . Fracture of left leg 2009   removal metal plate in left leg 2016  . Frequent headaches   . Infection    UTI  . Kidney stone    Kidney stone  . Migraines   . Neuropathy   . PCOS (polycystic ovarian syndrome)   . Tobacco abuse     Past Surgical History:  Procedure Laterality Date  . CESAREAN SECTION N/A 05/12/2013   Procedure: Primary cesarean section with delivery of baby boy at 0754.;  Surgeon: Freddrick March. Tenny Craw, MD;  Location: [facility];  Service: Obstetrics;  Laterality: N/A;  . REMOVAL OF IMPLANT Left 09/25/2015   Procedure: LEFT ANKLE REMOVAL OF DEEP  IMPLANT DEBRIDEMENT OF ANTERIOR MEDIAL ANKLE JOINT;  Surgeon: Toni Arthurs, MD;  Location: [facility];  Service: Orthopedics;  Laterality: Left;  . TIBIA FRACTURE SURGERY      There were no vitals filed for this visit.  Subjective Assessment - 01/04/18 1105    Subjective   They put me on a 3 day steroid because I was feeling so bad but today was my last day. I am better today    Pertinent History  recently diagnosed with MS    Patient Stated Goals  to improve coordination, grip strength, and writing    Currently in Pain?  Yes    Pain Location   Hand    Pain Orientation  Right;Left    Pain Descriptors / Indicators  Throbbing stiffness    Pain Type  Chronic pain    Pain Onset  More than a month ago    Pain Frequency  Intermittent    Aggravating Factors   nothing    Pain Relieving Factors  nothing                   OT Treatments/Exercises (OP) - 01/04/18 0001      Shoulder Exercises: ROM/Strengthening   UBE (Upper Arm Bike)  UBE x 8 min. Level 1 for strength/endurance      Hand Exercises   Other Hand Exercises  Gripper set at level 1 resistance to pick up blocks for sustained grip strength Lt hand, and at level 2 resistance for Rt hand. Pt able to do entire set of blocks with Lt hand, then entire set of blocks with Rt hand today.       Functional Reaching Activities   Mid Level  Standing to perform mid to high level reaching activities bilateral UE's to place small pegs in pegboard on vertical surface for coordination Rt and Lt hand.  Pt with fatigue BUE's. Then pt removing pegs one at a time until she gets 5 in hand, to move from fingertips to/from palm for in hand translation alternating hands. Pt with more difficulty and drops Lt hand               OT Short Term Goals - 12/22/17 1206      OT SHORT TERM GOAL #1   Title  Independent with HEP for coordination, hand strength, UE ROM/endurance bilaterally    Time  4    Period  Weeks    Status  Achieved      OT SHORT TERM GOAL #2   Title  Pt to verbalize understanding with energy conservation techniques    Time  4    Period  Weeks    Status  Achieved      OT SHORT TERM GOAL #3   Title  Pt to report greater ease with dressing and bathing using A/E prn    Time  4    Period  Weeks    Status  On-going      OT SHORT TERM GOAL #4   Title  Pt to improve grip strength Lt hand by 8 lbs     Baseline  10 lbs    Time  4    Period  Weeks    Status  On-going      OT SHORT TERM GOAL #5   Title  Pt to improve bilateral coordination by reducing speed on 9  hole peg test by 5 sec.     Baseline  Rt/Lt = 41 sec    Time  4    Period  Weeks    Status  On-going        OT Long Term Goals - 12/15/17 1637      OT LONG TERM GOAL #1   Title  Pt to return to simple cooking tasks with rest breaks and A/E prn    Time  8    Period  Weeks    Status  New      OT LONG TERM GOAL #2   Title  Pt to return to light household cleaning/management with rest breaks and A/E prn    Time  8    Period  Weeks    Status  New      OT LONG TERM GOAL #3   Title  Pt to improve Rt grip strength to 40 lbs, and Lt grip strength to 25 lbs for opening jars/containers    Baseline  Rt = 35 lbs, Lt = 10 lbs    Time  8    Period  Weeks    Status  New      OT LONG TERM GOAL #4   Title  Pt to verbalize understanding with pain management strategies, safety considerations for lack of sensation, and general guidelines for MS    Time  8    Period  Weeks    Status  New            Plan - 01/04/18 1135    Clinical Impression Statement  Pt with increased coordination, strength, and endurance today    Occupational Profile and client history currently impacting functional performance  Current limitations effect ADLS, IADLS, childcare, and pt had to recently quit job.     Rehab Potential  Good    OT Frequency  2x / week    OT Duration  8 weeks    OT Treatment/Interventions  Self-care/ADL training;DME and/or AE instruction;Splinting;Aquatic Therapy;Therapeutic activities;Therapeutic exercise;Cognitive remediation/compensation;Coping strategies training;Neuromuscular education;Functional Mobility Training;Passive range of motion;Visual/perceptual remediation/compensation;Energy conservation;Manual Therapy;Patient/family education    Plan  continue progress towards goals.        Patient will benefit from skilled therapeutic intervention in order to improve the following deficits and impairments:  Decreased coordination, Decreased range of motion, Impaired flexibility,  Decreased endurance, Impaired sensation, Decreased coping skills, Decreased activity tolerance, Decreased knowledge of precautions, Decreased balance, Impaired UE functional use, Pain, Decreased knowledge of use of DME, Decreased cognition, Decreased mobility, Decreased strength, Impaired vision/preception  Visit Diagnosis: Other lack of coordination  Muscle weakness (generalized)  Unsteadiness on feet    Problem List Patient Active Problem List   Diagnosis Date Noted  . MS (multiple sclerosis) (HCC) 12/16/2017  . Demyelinating disease (HCC) 12/08/2017  . Tobacco abuse     Kelli Churn, OTR/L 01/04/2018, 11:40 AM  [facility] [address] [place], [place], [address] Phone: [phone]   Fax:  [phone]  Name: ALEGNA VANDERKOLK MRN: [id number] Date of Birth: [birth year]

## 2018-01-04 NOTE — Telephone Encounter (Signed)
Pt is asking for a call back to discuss her insurance company denying her Tysabri due to a lack of information from Dr Marjory Lies

## 2018-01-05 ENCOUNTER — Other Ambulatory Visit: Payer: Self-pay

## 2018-01-05 MED ORDER — DULOXETINE HCL 30 MG PO CPEP: 60.0000 mg | ORAL_CAPSULE | Freq: Every day | ORAL | 3 refills | Status: DC

## 2018-01-09 ENCOUNTER — Other Ambulatory Visit: Payer: Self-pay

## 2018-01-09 ENCOUNTER — Encounter (HOSPITAL_COMMUNITY): Payer: Self-pay | Admitting: Emergency Medicine

## 2018-01-09 ENCOUNTER — Emergency Department (HOSPITAL_COMMUNITY)
Admission: EM | Admit: 2018-01-09 | Discharge: 2018-01-10 | Disposition: A | Discharge status: Home / Self Care | Payer: BLUE CROSS/BLUE SHIELD | Attending: Emergency Medicine | Admitting: Emergency Medicine

## 2018-01-09 DIAGNOSIS — F17210 Nicotine dependence, cigarettes, uncomplicated: Secondary | ICD-10-CM | POA: Insufficient documentation

## 2018-01-09 DIAGNOSIS — M25552 Pain in left hip: Secondary | ICD-10-CM | POA: Diagnosis present

## 2018-01-09 DIAGNOSIS — G35 Multiple sclerosis: Secondary | ICD-10-CM | POA: Insufficient documentation

## 2018-01-09 LAB — CBC WITH DIFFERENTIAL/PLATELET
BASOS ABS: 0 10*3/uL (ref 0.0–0.1)
BASOS PCT: 0 %
EOS ABS: 0.2 10*3/uL (ref 0.0–0.7)
Eosinophils Relative: 3 %
HEMATOCRIT: 43 % (ref 36.0–46.0)
HEMOGLOBIN: 14.1 g/dL (ref 12.0–15.0)
Lymphocytes Relative: 32 %
Lymphs Abs: 2.3 10*3/uL (ref 0.7–4.0)
MCH: 28.8 pg (ref 26.0–34.0)
MCHC: 32.8 g/dL (ref 30.0–36.0)
MCV: 87.8 fL (ref 78.0–100.0)
Monocytes Absolute: 0.3 10*3/uL (ref 0.1–1.0)
Monocytes Relative: 4 %
NEUTROS ABS: 4.3 10*3/uL (ref 1.7–7.7)
NEUTROS PCT: 61 %
Platelets: 460 10*3/uL — ABNORMAL HIGH (ref 150–400)
RBC: 4.9 MIL/uL (ref 3.87–5.11)
RDW: 14.9 % (ref 11.5–15.5)
WBC: 7.1 10*3/uL (ref 4.0–10.5)

## 2018-01-09 NOTE — ED Triage Notes (Signed)
Pt reports her head "is feeling funny.. Like swimmy"  Pt reports she also started expericing tremors in her head and arms.  Friend agrees pt is having trouble "finding her words" and added "at times she zones out."

## 2018-01-10 ENCOUNTER — Emergency Department (HOSPITAL_COMMUNITY): Payer: BLUE CROSS/BLUE SHIELD

## 2018-01-10 ENCOUNTER — Ambulatory Visit: Payer: BLUE CROSS/BLUE SHIELD | Admitting: Occupational Therapy

## 2018-01-10 LAB — URINALYSIS, ROUTINE W REFLEX MICROSCOPIC
Bilirubin Urine: NEGATIVE
Glucose, UA: NEGATIVE mg/dL
Hgb urine dipstick: NEGATIVE
Ketones, ur: NEGATIVE mg/dL
Leukocytes, UA: NEGATIVE
Nitrite: NEGATIVE
Protein, ur: NEGATIVE mg/dL
Specific Gravity, Urine: 1.02 (ref 1.005–1.030)
pH: 5 (ref 5.0–8.0)

## 2018-01-10 LAB — COMPREHENSIVE METABOLIC PANEL
ALT: 30 U/L (ref 14–54)
ANION GAP: 7 (ref 5–15)
AST: 23 U/L (ref 15–41)
Albumin: 3.2 g/dL — ABNORMAL LOW (ref 3.5–5.0)
Alkaline Phosphatase: 91 U/L (ref 38–126)
BUN: 7 mg/dL (ref 6–20)
CALCIUM: 9 mg/dL (ref 8.9–10.3)
CO2: 24 mmol/L (ref 22–32)
CREATININE: 0.88 mg/dL (ref 0.44–1.00)
Chloride: 105 mmol/L (ref 101–111)
Glucose, Bld: 168 mg/dL — ABNORMAL HIGH (ref 65–99)
Potassium: 3.7 mmol/L (ref 3.5–5.1)
SODIUM: 136 mmol/L (ref 135–145)
Total Bilirubin: 0.5 mg/dL (ref 0.3–1.2)
Total Protein: 7 g/dL (ref 6.5–8.1)

## 2018-01-10 LAB — PREGNANCY, URINE: Preg Test, Ur: NEGATIVE

## 2018-01-10 MED ORDER — KETOROLAC TROMETHAMINE 30 MG/ML IJ SOLN
30.0000 mg | Freq: Once | INTRAMUSCULAR | Status: AC
  Administered 2018-01-10: 30 mg via INTRAMUSCULAR
  Filled 2018-01-10: qty 1

## 2018-01-10 MED ORDER — NAPROXEN 500 MG PO TABS: 500.0000 mg | ORAL_TABLET | Freq: Two times a day (BID) | ORAL | 0 refills | Status: DC

## 2018-01-10 NOTE — Discharge Instructions (Addendum)
Medications: Naprosyn  Treatment: Take Naprosyn twice daily as needed for your left hip pain.  Use ice and heat alternating 20 minutes on, 20 minutes off.  Follow-up: Please call your neurologist today to make them aware of your symptoms and for continued treatment of your MS.  Please return the emergency department if you develop any new or worsening symptoms.

## 2018-01-10 NOTE — ED Provider Notes (Signed)
[name] Provider Note   CSN: [id number] Arrival date & time: 01/09/18  2159     History   Chief Complaint Chief Complaint  Patient presents with  . MS  . Hip Pain    HPI Samantha [name] is a 32 y.o. female with recent diagnosis of multiple sclerosis who presents with 2-day history of increased feeling of lightheadedness and dizziness, difficulty word finding, as well as left hip pain.  She reports having the lightheadedness and swimmy headed feeling as well as difficulty word finding while she was in the hospital, however improved.  He returned 2 days ago.  Patient has not had a left hip pain before.  She has had the pain on for the same amount of time as the other symptoms.  She denies injury or heavy lifting.  She denies any new numbness or tingling to her extremities.  She has been taking Tylenol at home without relief.  She denies any urinary symptoms, abdominal pain, chest pain, shortness of breath, nausea, vomiting, saddle anesthesia, bowel or bladder incontinence.  HPI  Past Medical History:  Diagnosis Date  . Arthritis   . Chicken pox   . Chlamydia 12/02/2011  . Depression   . Fracture of left leg 2009   removal metal plate in left leg 2016  . Frequent headaches   . Infection    UTI  . Kidney stone    Kidney stone  . Migraines   . Neuropathy   . PCOS (polycystic ovarian syndrome)   . Tobacco abuse     Patient Active Problem List   Diagnosis Date Noted  . MS (multiple sclerosis) (HCC) 12/16/2017  . Demyelinating disease (HCC) 12/08/2017  . Tobacco abuse     Past Surgical History:  Procedure Laterality Date  . CESAREAN SECTION N/A 05/12/2013   Procedure: Primary cesarean section with delivery of baby boy at 0754.;  Surgeon: Freddrick March. Tenny Craw, MD;  Location: [facility];  Service: Obstetrics;  Laterality: N/A;  . REMOVAL OF IMPLANT Left 09/25/2015   Procedure: LEFT ANKLE REMOVAL OF DEEP  IMPLANT DEBRIDEMENT OF ANTERIOR MEDIAL  ANKLE JOINT;  Surgeon: Toni Arthurs, MD;  Location: [facility];  Service: Orthopedics;  Laterality: Left;  . TIBIA FRACTURE SURGERY       OB History    Gravida  1   Para  1   Term  1   Preterm      AB      Living  1     SAB      TAB      Ectopic      Multiple      Live Births  1            Home Medications    Prior to Admission medications   Medication Sig Start Date End Date Taking? Authorizing Provider  DULoxetine (CYMBALTA) 30 MG capsule Take 2 capsules (60 mg total) by mouth daily. 01/05/18  Yes Ardith Dark, MD  dexamethasone (DECADRON) 2 MG tablet Take 3 tablets the first day, 2 the second and 1 the third day Patient not taking: Reported on 01/10/2018 01/01/18   York Spaniel, MD  HYDROcodone-acetaminophen (NORCO/VICODIN) 5-325 MG tablet Take 1-2 tablets by mouth every 6 (six) hours as needed for moderate pain or severe pain. Patient not taking: Reported on 01/10/2018 12/12/17   Delano Metz, MD  naproxen (NAPROSYN) 500 MG tablet Take 1 tablet (500 mg total) by mouth 2 (two)  times daily. 01/10/18   Emilygrace Grothe, Waylan Boga, PA-C  tizanidine (ZANAFLEX) 2 MG capsule Take 1 capsule (2 mg total) by mouth 3 (three) times daily. Patient not taking: Reported on 01/10/2018 12/29/17   Asa Lente, MD    Family History Family History  Problem Relation Age of Onset  . Hypertension Other   . Diabetes Other   . Ulcers Mother   . GER disease Mother   . Asthma Mother   . Heart attack Mother   . Lupus Mother   . Cancer Father   . Drug abuse Father   . GER disease Sister   . Asthma Brother   . Birth defects Maternal Grandfather   . Heart attack Maternal Grandfather   . High blood pressure Maternal Grandfather   . Stroke Maternal Grandfather   . Autism Son   . Learning disabilities Son     Social History Social History   Tobacco Use  . Smoking status: Current Every Day Smoker    Packs/day: 0.25    Years: 2.00    Pack years: 0.50    Types:  Cigarettes  . Smokeless tobacco: Never Used  . Tobacco comment: quit with preg  Substance Use Topics  . Alcohol use: No    Comment: socially none since pregnancy  . Drug use: No     Allergies   Patient has no known allergies.   Review of Systems Review of Systems  Constitutional: Negative for chills and fever.  HENT: Negative for facial swelling and sore throat.   Respiratory: Negative for shortness of breath.   Cardiovascular: Negative for chest pain.  Gastrointestinal: Negative for abdominal pain, nausea and vomiting.  Genitourinary: Negative for dysuria.  Musculoskeletal: Positive for arthralgias (L hip). Negative for back pain and neck pain.  Skin: Negative for rash and wound.  Neurological: Positive for dizziness. Negative for headaches.  Psychiatric/Behavioral: The patient is not nervous/anxious.      Physical Exam Updated Vital Signs BP 140/60 (BP Location: Right Arm)   Pulse 60   Temp 98.3 F (36.8 C)   Resp 16   Ht 5\' 5"  (1.651 m)   Wt 115.7 kg (255 lb)   SpO2 98%   BMI 42.43 kg/m   Physical Exam  Constitutional: She appears well-developed and well-nourished. No distress.  HENT:  Head: Normocephalic and atraumatic.  Mouth/Throat: Oropharynx is clear and moist. No oropharyngeal exudate.  Eyes: Pupils are equal, round, and reactive to light. Conjunctivae are normal. Right eye exhibits no discharge. Left eye exhibits no discharge. No scleral icterus.  Neck: Normal range of motion. Neck supple. No thyromegaly present.  Cardiovascular: Normal rate, regular rhythm, normal heart sounds and intact distal pulses. Exam reveals no gallop and no friction rub.  No murmur heard. Pulmonary/Chest: Effort normal and breath sounds normal. No stridor. No respiratory distress. She has no wheezes. She has no rales.  Abdominal: Soft. Bowel sounds are normal. She exhibits no distension. There is no tenderness. There is no rebound and no guarding.  Musculoskeletal: She exhibits  no edema.       Left hip: She exhibits bony tenderness. She exhibits normal range of motion, normal strength and no deformity.       Legs: Left anterior lateral hip tenderness on palpation, no erythema or warmth noted, full range of motion No midline tenderness to the thoracic or lumbar spine, midline tenderness of the cervical spine, which is typical for the patient since her diagnosis  Lymphadenopathy:    She has  no cervical adenopathy.  Neurological: She is alert. Coordination normal.  CN 3-12 intact; normal sensation throughout; 5/5 strength in all 4 extremities; equal bilateral grip strength; patient slow to respond, however speech is clear  Skin: Skin is warm and dry. No rash noted. She is not diaphoretic. No pallor.  Psychiatric: She has a normal mood and affect.  Nursing note and vitals reviewed.    ED Treatments / Results  Labs (all labs ordered are listed, but only abnormal results are displayed) Labs Reviewed  COMPREHENSIVE METABOLIC PANEL - Abnormal; Notable for the following components:      Result Value   Glucose, Bld 168 (*)    Albumin 3.2 (*)    All other components within normal limits  CBC WITH DIFFERENTIAL/PLATELET - Abnormal; Notable for the following components:   Platelets 460 (*)    All other components within normal limits  URINALYSIS, ROUTINE W REFLEX MICROSCOPIC - Abnormal; Notable for the following components:   APPearance HAZY (*)    All other components within normal limits  PREGNANCY, URINE    EKG None  Radiology Dg Hip Unilat W Or Wo Pelvis 2-3 Views Left  Result Date: 01/10/2018 CLINICAL DATA:  Left hip pain EXAM: DG HIP (WITH OR WITHOUT PELVIS) 2-3V LEFT COMPARISON:  None. FINDINGS: Pelvis and hips appear symmetric and intact. Negative for fracture. No acute osseous finding or significant arthropathy. Normal bowel gas pattern. IMPRESSION: No acute finding by plain radiography Electronically Signed   By: Judie Petit.  Shick M.D.   On: 01/10/2018 08:02     Procedures Procedures (including critical care time)  Medications Ordered in ED Medications  ketorolac (TORADOL) 30 MG/ML injection 30 mg (30 mg Intramuscular Given 01/10/18 0727)     Initial Impression / Assessment and Plan / ED Course  I have reviewed the triage vital signs and the nursing notes.  Pertinent labs & imaging results that were available during my care of the patient were reviewed by me and considered in my medical decision making (see chart for details).     Patient with left hip pain and recurrence of MS symptoms.  CBC, CMP unremarkable.  UA negative.  Urine pregnancy negative.  Left hip x-ray is negative.  Patient feeling much improved after Toradol.  Per chart review, patient has had the symptoms she describes today with her MS.  She has not been able to start the Tysabri due to difficulty getting approved by her insurance, however it is in the process of being fixed.  She reports that she was also prescribed baclofen, however she feels this made her head feel the same way she feels today.  She has stopped taking this medication.  She does not want another muscle relaxer.  Will treat her hip pain with Naprosyn.  Patient has appointment scheduled to start physical therapy this week for her MS.  I advised her to speak to her physical therapist about her hip pain.  Supportive treatment discussed including ice and heat as well as stretching.  Return precautions discussed.  Patient understands and agrees with plan.  Patient vitals stable throughout ED course and discharged in satisfactory condition. I discussed patient case with Dr. Wilkie Aye who guided the patient's management and agrees with plan.   Final Clinical Impressions(s) / ED Diagnoses   Final diagnoses:  Left hip pain  Multiple sclerosis [facility])    ED Discharge Orders        Ordered    naproxen (NAPROSYN) 500 MG tablet  2 times daily  01/10/18 1610       Emi Holes, PA-C 01/10/18 1149    Shon Baton, MD 01/10/18 [phone]

## 2018-01-11 ENCOUNTER — Ambulatory Visit: Payer: BLUE CROSS/BLUE SHIELD | Admitting: Occupational Therapy

## 2018-01-11 DIAGNOSIS — R208 Other disturbances of skin sensation: Secondary | ICD-10-CM

## 2018-01-11 DIAGNOSIS — R278 Other lack of coordination: Secondary | ICD-10-CM | POA: Diagnosis not present

## 2018-01-11 DIAGNOSIS — M6281 Muscle weakness (generalized): Secondary | ICD-10-CM

## 2018-01-11 NOTE — Therapy (Signed)
[facility] [address] [place], [place], [address] Phone: [phone]   Fax:  [phone]  Occupational Therapy Treatment  Patient Details  Name: Samantha [name] MRN: [id number] Date of Birth: 1986/05/28 No data recorded  Encounter Date: 01/11/2018  OT End of Session - 01/11/18 1335    Visit Number  7    Number of Visits  17    Date for OT Re-Evaluation  02/14/18    Authorization Type  BC/BS    OT Start Time  1233    OT Stop Time  1330    OT Time Calculation (min)  57 min    Activity Tolerance  Patient tolerated treatment well    Behavior During Therapy  [facility] for tasks assessed/performed       Past Medical History:  Diagnosis Date  . Arthritis   . Chicken pox   . Chlamydia 12/02/2011  . Depression   . Fracture of left leg 2009   removal metal plate in left leg 3016  . Frequent headaches   . Infection    UTI  . Kidney stone    Kidney stone  . Migraines   . Neuropathy   . PCOS (polycystic ovarian syndrome)   . Tobacco abuse     Past Surgical History:  Procedure Laterality Date  . CESAREAN SECTION N/A 05/12/2013   Procedure: Primary cesarean section with delivery of baby boy at 0754.;  Surgeon: Farrel Gobble. Harrington Challenger, MD;  Location: [facility];  Service: Obstetrics;  Laterality: N/A;  . REMOVAL OF IMPLANT Left 09/25/2015   Procedure: LEFT ANKLE REMOVAL OF DEEP  IMPLANT DEBRIDEMENT OF ANTERIOR MEDIAL ANKLE JOINT;  Surgeon: Wylene Simmer, MD;  Location: [facility];  Service: Orthopedics;  Laterality: Left;  . TIBIA FRACTURE SURGERY      There were no vitals filed for this visit.  Subjective Assessment - 01/11/18 1240    Subjective   I went to the ED on Monday night (01/09/18). They gave me an injection for the Lt hip pain    Pertinent History  recently diagnosed with MS    Patient Stated Goals  to improve coordination, grip strength, and writing    Currently in Pain?  Yes    Pain Score  4     Pain  Location  Hand    Pain Orientation  Right;Left    Pain Descriptors / Indicators  Pins and needles;Numbness stiffness    Pain Type  Chronic pain    Pain Onset  More than a month ago    Pain Frequency  Constant    Aggravating Factors   nothing    Pain Relieving Factors  nothing         [facility] OT Assessment - 01/11/18 0001      Coordination   Right 9 Hole Peg Test  24.72 sec    Left 9 Hole Peg Test  30.13 sec      Hand Function   Right Hand Grip (lbs)  48 lbs    Left Hand Grip (lbs)  30 lbs               OT Treatments/Exercises (OP) - 01/11/18 0001      ADLs   ADL Comments  Assessed remaining STG's - pt has met all STG's and progressed with bilateral hand coordination and bilateral grip strength. Discussed further ways to adapt tasks/ADLS for energy conservation and to increase ease with ADLS. Also discussed childcare tasks - pt reports  trouble with picking up child's toys (child is autistic and cannot pick up toys himself) - recommended dividing up toys into bins and only allowing child to play with one bin each week, then switch out/rotate. Pt also shown how to navigate [place] website and therapist found 3 support groups within 20 mile radius for her and printed them out. Pt shown different resources and tabs on website that would be beneficial      Hand Exercises   Other Hand Exercises  Gripper increased to level 2 resistance for Lt hand for sustained grip strength - pt with mod difficulty and increased drops near end of task d/t fatigue. Pt required 1 rest break.                OT Short Term Goals - 01/11/18 1336      OT SHORT TERM GOAL #1   Title  Independent with HEP for coordination, hand strength, UE ROM/endurance bilaterally    Time  4    Period  Weeks    Status  Achieved      OT SHORT TERM GOAL #2   Title  Pt to verbalize understanding with energy conservation techniques    Time  4    Period  Weeks    Status  Achieved      OT SHORT TERM GOAL  #3   Title  Pt to report greater ease with dressing and bathing using A/E prn    Time  4    Period  Weeks    Status  Achieved      OT SHORT TERM GOAL #4   Title  Pt to improve grip strength Lt hand by 8 lbs     Baseline  10 lbs    Time  4    Period  Weeks    Status  Achieved Lt = 30 lbs      OT SHORT TERM GOAL #5   Title  Pt to improve bilateral coordination by reducing speed on 9 hole peg test by 5 sec.     Baseline  Rt/Lt = 41 sec    Time  4    Period  Weeks    Status  Achieved Rt = 24 sec, Lt = 30 sec        OT Long Term Goals - 01/11/18 1336      OT LONG TERM GOAL #1   Title  Pt to return to simple cooking tasks with rest breaks and A/E prn    Time  8    Period  Weeks    Status  On-going Pt reports doing at home      OT LONG TERM GOAL #2   Title  Pt to return to light household cleaning/management with rest breaks and A/E prn    Time  8    Period  Weeks    Status  On-going Pt reports doing at home      OT LONG TERM GOAL #3   Title  Pt to improve Rt grip strength to 40 lbs, and Lt grip strength to 25 lbs for opening jars/containers    Baseline  Rt = 35 lbs, Lt = 10 lbs    Time  8    Period  Weeks    Status  Achieved Rt = 48 lbs, Lt = 30 lbs      OT LONG TERM GOAL #4   Title  Pt to verbalize understanding with pain management strategies, safety considerations for lack of  sensation, and general guidelines for MS    Time  8    Period  Weeks    Status  On-going            Plan - 01/11/18 1337    Clinical Impression Statement  Pt with increased strength and coordination bilateral hands. Pt has met all STG's and most LTG's at this time.     Occupational Profile and client history currently impacting functional performance  Current limitations effect ADLS, IADLS, childcare, and pt had to recently quit job.     Rehab Potential  Good    OT Frequency  2x / week    OT Duration  8 weeks    OT Treatment/Interventions  Self-care/ADL training;DME and/or AE  instruction;Splinting;Aquatic Therapy;Therapeutic activities;Therapeutic exercise;Cognitive remediation/compensation;Coping strategies training;Neuromuscular education;Functional Mobility Training;Passive range of motion;Visual/perceptual remediation/compensation;Energy conservation;Manual Therapy;Patient/family education    Plan  issue theraband HEP for UE strength. Following session: cooking and cleaning tasks    Consulted and Agree with Plan of Care  Patient       Patient will benefit from skilled therapeutic intervention in order to improve the following deficits and impairments:  Decreased coordination, Decreased range of motion, Impaired flexibility, Decreased endurance, Impaired sensation, Decreased coping skills, Decreased activity tolerance, Decreased knowledge of precautions, Decreased balance, Impaired UE functional use, Pain, Decreased knowledge of use of DME, Decreased cognition, Decreased mobility, Decreased strength, Impaired vision/preception  Visit Diagnosis: Muscle weakness (generalized)  Other disturbances of skin sensation    Problem List Patient Active Problem List   Diagnosis Date Noted  . MS (multiple sclerosis) ([facility]) 12/16/2017  . Demyelinating disease ([facility]) 12/08/2017  . Tobacco abuse     Carey Bullocks, OTR/L 01/11/2018, 1:39 PM  [facility] [address] [place], [place], [address] Phone: [phone]   Fax:  [phone]  Name: MICHELENE KENISTON MRN: [id number] Date of Birth: 03-14-1986

## 2018-01-13 ENCOUNTER — Encounter: Payer: Self-pay | Admitting: Physical Therapy

## 2018-01-13 ENCOUNTER — Ambulatory Visit: Payer: BLUE CROSS/BLUE SHIELD | Admitting: Physical Therapy

## 2018-01-13 ENCOUNTER — Other Ambulatory Visit: Payer: Self-pay

## 2018-01-13 DIAGNOSIS — R278 Other lack of coordination: Secondary | ICD-10-CM | POA: Diagnosis not present

## 2018-01-13 DIAGNOSIS — M6281 Muscle weakness (generalized): Secondary | ICD-10-CM

## 2018-01-13 DIAGNOSIS — R2681 Unsteadiness on feet: Secondary | ICD-10-CM

## 2018-01-13 DIAGNOSIS — R2689 Other abnormalities of gait and mobility: Secondary | ICD-10-CM

## 2018-01-14 NOTE — Therapy (Signed)
[facility] [address] [place], [place], [address] Phone: [phone]   Fax:  [phone]  Physical Therapy Evaluation  Patient Details  Name: Samantha [name] MRN: [id number] Date of Birth: January 14, 1986 Referring Provider: Joycelyn Schmid MD   Encounter Date: 01/13/2018  PT End of Session - 01/13/18 1121    Visit Number  1    Number of Visits  5 4 plus eval    Date for PT Re-Evaluation  02/17/18    Authorization Type  [employer] 30 VL PT/OT/Chiro    Authorization - Visit Number  8 7 OT to date, 1 PT    Authorization - Number of Visits  30    PT Start Time  1015    PT Stop Time  1105    PT Time Calculation (min)  50 min    Activity Tolerance  Patient tolerated treatment well    Behavior During Therapy  [facility] for tasks assessed/performed       Past Medical History:  Diagnosis Date  . Arthritis   . Chicken pox   . Chlamydia 12/02/2011  . Depression   . Fracture of left leg 2009   removal metal plate in left leg 2016  . Frequent headaches   . Infection    UTI  . Kidney stone    Kidney stone  . Migraines   . Neuropathy   . PCOS (polycystic ovarian syndrome)   . Tobacco abuse     Past Surgical History:  Procedure Laterality Date  . CESAREAN SECTION N/A 05/12/2013   Procedure: Primary cesarean section with delivery of baby boy at 0754.;  Surgeon: Freddrick March. Tenny Craw, MD;  Location: [facility];  Service: Obstetrics;  Laterality: N/A;  . REMOVAL OF IMPLANT Left 09/25/2015   Procedure: LEFT ANKLE REMOVAL OF DEEP  IMPLANT DEBRIDEMENT OF ANTERIOR MEDIAL ANKLE JOINT;  Surgeon: Toni Arthurs, MD;  Location: [facility];  Service: Orthopedics;  Laterality: Left;  . TIBIA FRACTURE SURGERY      There were no vitals filed for this visit.   Subjective Assessment - 01/13/18 1016    Subjective  I know I have trouble with my leg strength. On my bad days I get really weak. I have fallen once (a couple of weeks ago) up at  night to go to the bathroom and legs collapsed. Lots of near falls, especially when legs are tremoring. Since getting IV steroids at the hospital early March her symptoms had improved and now returning/intensifying. MD is aware. Her insurance has not given pre-authorization for medicine MD wants to put her on, so currently not undergoing any treatment medically. Was just diagnosesd with MS 12/13/17 after hospitalization. Used to enjoy Zumba, going to the gym, and walking for exercise.     How long can you stand comfortably?  not sure    How long can you walk comfortably?  not sure    Diagnostic tests  MRI brain and cervical spine +demyelinating disease    Patient Stated Goals  I don't want to fall anymore. Build up some strength and not stumble everywhere    Currently in Pain?  Yes    Pain Score  2     Pain Location  Knee    Pain Orientation  Right    Pain Descriptors / Indicators  Tightness;Other (Comment) swollen, tight, feels like it has knot     Pain Type  Chronic pain    Pain Onset  More than a month ago  Pain Frequency  Intermittent    Aggravating Factors   nothing    Pain Relieving Factors  nothing    Multiple Pain Sites  Yes    Pain Location  Hand    Pain Orientation  Right;Left    Pain Descriptors / Indicators  Pins and needles    Pain Type  Chronic pain    Pain Onset  More than a month ago    Pain Frequency  Constant    Aggravating Factors   nothing    Pain Relieving Factors  nothing    Effect of Pain on Daily Activities  hard to hold onto things         [facility] PT Assessment - 01/13/18 1026      Assessment   Medical Diagnosis  MS    Referring Provider  Penumalli,Vikram MD    Onset Date/Surgical Date  12/08/17 symptoms since 2016    Hand Dominance  Right    Prior Therapy  Acute PT, OPOT      Precautions   Precautions  Fall      Restrictions   Weight Bearing Restrictions  No      Balance Screen   Has the patient fallen in the past 6 months  Yes    How many times?   1    Has the patient had a decrease in activity level because of a fear of falling?   Yes    Is the patient reluctant to leave their home because of a fear of falling?   No      Home Environment   Living Environment  Private residence    Living Arrangements  Spouse/significant other;Children by end of month movng to Mom's    Available Help at Discharge  Family;Available PRN/intermittently    Type of Home  House    Home Layout  Multi-level    Alternate Level Stairs-Number of Steps  20 to basement  and to 3rd floor    Alternate Level Stairs-Rails  Right;Left    Home Equipment  -- at mom's; 2 steps into shower no grab bar    Additional Comments  current home one level, 2 steps to enter      Prior Function   Level of Independence  Independent    Vocation  -- hasn't worked since February; applied for disability    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    Leisure  arts/crafts      Cognition   Overall Cognitive Status  Within Functional Limits for tasks assessed      Sensation   Light Touch  Impaired by gross assessment pins & needles bottom of feet; numb knees down    Proprioception  Appears Intact ankles assessed      Coordination   Gross Motor Movements are Fluid and Coordinated  Yes    Fine Motor Movements are Fluid and Coordinated  No rapid toe tapping is slow and becomes asynchronous      Posture/Postural Control   Posture/Postural Control  No significant limitations      ROM / Strength   AROM / PROM / Strength  AROM;Strength      AROM   Overall AROM   Deficits LEs WNL except Lt ankle h/o fx ORIF and DF only 10      Strength   Overall Strength  Deficits    Overall Strength Comments  RLE: knee extension 3+, flexion 3, DF 5; LLE knee extension 4+, flexion 4, ankle DF 5  Transfers   Transfers  Sit to Stand    Sit to Stand  6: Modified independent (Device/Increase time);With upper extremity assist;With armrests unable without UE assist    Five time  sit to stand comments   21.43 using UEs and armrests      Ambulation/Gait   Ambulation Distance (Feet)  40 Feet 75, 80    Assistive device  None    Gait Pattern  Decreased arm swing - right;Decreased arm swing - left;Decreased step length - left;Wide base of support    Ambulation Surface  Level;Indoor    Gait velocity  32.8/12.29=2.67 norm for age 55.8 ft/sec    Stairs  Yes    Stairs Assistance  6: Modified independent (Device/Increase time)    Stair Management Technique  Two rails;Alternating pattern;Forwards    Number of Stairs  4    Height of Stairs  6      Balance   Balance Assessed  Yes      High Level Balance   High Level Balance Comments  Stand on blue airex foam, feet apart EO no sway; EC +sway with mostly independent recovery (touched corner walls 4 times in 30 sec)      Functional Gait  Assessment   Gait assessed   Yes    Gait Level Surface  Walks 20 ft, slow speed, abnormal gait pattern, evidence for imbalance or deviates 10-15 in outside of the 12 in walkway width. Requires more than 7 sec to ambulate 20 ft.    Change in Gait Speed  Makes only minor adjustments to walking speed, or accomplishes a change in speed with significant gait deviations, deviates 10-15 in outside the 12 in walkway width, or changes speed but loses balance but is able to recover and continue walking.    Gait with Horizontal Head Turns  Performs head turns smoothly with slight change in gait velocity (eg, minor disruption to smooth gait path), deviates 6-10 in outside 12 in walkway width, or uses an assistive device.    Gait with Vertical Head Turns  Performs task with slight change in gait velocity (eg, minor disruption to smooth gait path), deviates 6 - 10 in outside 12 in walkway width or uses assistive device    Gait and Pivot Turn  Pivot turns safely in greater than 3 sec and stops with no loss of balance, or pivot turns safely within 3 sec and stops with mild imbalance, requires small steps to catch  balance.    Step Over Obstacle  Is able to step over 2 stacked shoe boxes taped together (9 in total height) without changing gait speed. No evidence of imbalance.    Gait with Narrow Base of Support  Ambulates less than 4 steps heel to toe or cannot perform without assistance.    Gait with Eyes Closed  Walks 20 ft, slow speed, abnormal gait pattern, evidence for imbalance, deviates 10-15 in outside 12 in walkway width. Requires more than 9 sec to ambulate 20 ft.    Ambulating Backwards  Walks 20 ft, uses assistive device, slower speed, mild gait deviations, deviates 6-10 in outside 12 in walkway width.    Steps  Alternating feet, must use rail.    Total Score  16    FGA comment:  < 19 = high risk fall                Objective measurements completed on examination: See above findings.  PT Education - 01/13/18 1119    Education Details  delicate balance between exercising for improvement and overdoing and making things worse; begin by walking in home (or she prefers driveway, which she has been doing) and tracking how long she can walk at a time so we'l have a baseline to start from    [facility]) Educated  Patient    Methods  Explanation    Comprehension  Verbalized understanding          PT Long Term Goals - 01/14/18 0840      PT LONG TERM GOAL #1   Title  Patient will be independent with HEP for balance and strengthening and able to verbalize plan for community-based activity. (Target for all LTGs 02/24/18, date extended due to delayed start due to census)    Time  4    Period  Weeks    Status  New    Target Date  02/24/18      PT LONG TERM GOAL #2   Title  Patient will improve FGA to >=23/30 to demonstrate improved balance and lesser fall risk.     Baseline  4/12 16/30    Time  4    Period  Weeks    Status  New      PT LONG TERM GOAL #3   Title  Patient will improve gait velocity >=2.9 ft/sec demonstrating improved balance and lesser fall risk  (age approp norm 3.8 ft/sec)    Baseline  4/12 2.67 ft/sec    Time  4    Period  Weeks    Status  New      PT LONG TERM GOAL #4   Title  Patient will improve 5x sit to stand to <=17 sec demonstrating improved LE strength.    Baseline  4/12 21.43 sec    Time  4    Period  Weeks    Status  New             Plan - 01/14/18 0816    Clinical Impression Statement  Patient referred to OPPT with new diagnosis of MS. She is currently undergoing OPOT who recommended PT evaluation due to decreased balance. Patient has had one fall in the past 6 months which occurred in the last few weeks. Her FGA score of 16/30 and 5 times sit to stand of 21.43 seconds put her in the high fall risk category. She overall displays slowed, cautious movements with wide base of support. Patient's primary goals are to address her leg weakness and reduce her risk of falling. Patient can benefit from PT to address the deficits listed below via the interventions listed below.      History and Personal Factors relevant to plan of care:  PMH-newly diagnosed MS, neuropathy, arthritis, migraines, depression  Personal factors-limited family support (husband drives trucks & frequently gone, however soon to move in with her mother), unknown expected progression of patient (recent hospitalization due to exacerbation/flare and recently symptoms worsening)    Clinical Presentation  Unstable    Clinical Presentation due to:  symptoms currently worsening, not yet started medical treatment due to awaiting insurance approval for meds    Clinical Decision Making  High    Rehab Potential  Fair    Clinical Impairments Affecting Rehab Potential  recent flare with symptoms currently worsening;     PT Frequency  1x / week    PT Duration  4 weeks    PT Treatment/Interventions  ADLs/Self Care Home Management;Aquatic Therapy;Gait  training;DME Instruction;Stair training;Functional mobility training;Therapeutic activities;Therapeutic  exercise;Balance training;Neuromuscular re-education;Patient/family education;Passive range of motion;Vestibular    PT Next Visit Plan  Needs to make 1 more appt for total 4 PT appts; discuss her walking time/tolerance (asked to assess herself) or do (norm 472 ft; MDC 22 ft); initiate HEP-corner balance on foam: EC 30 sec working towards feet together, EC feet apart head turns; tandem walking fwd/bkwd at counter; walking head turns; sit to stand; walking program; discuss ?water aerobics/zumba as moving into summer    Consulted and Agree with Plan of Care  Patient       Patient will benefit from skilled therapeutic intervention in order to improve the following deficits and impairments:  Abnormal gait, Decreased activity tolerance, Decreased balance, Decreased mobility, Decreased knowledge of use of DME, Decreased knowledge of precautions, Decreased endurance, Decreased coordination, Decreased range of motion, Decreased strength, Dizziness, Difficulty walking, Increased muscle spasms, Impaired sensation, Impaired UE functional use, Obesity, Pain  Visit Diagnosis: Muscle weakness (generalized) - Plan: PT plan of care cert/re-cert  Unsteadiness on feet - Plan: PT plan of care cert/re-cert  Other abnormalities of gait and mobility - Plan: PT plan of care cert/re-cert     Problem List Patient Active Problem List   Diagnosis Date Noted  . MS (multiple sclerosis) (HCC) 12/16/2017  . Demyelinating disease (HCC) 12/08/2017  . Tobacco abuse     Zena Amos, PT 01/14/2018, 8:57 AM  [facility] [address] [place], [place], [address] Phone: [phone]   Fax:  [phone]  Name: Samantha [name] MRN: [id number] Date of Birth: 16-Feb-1986

## 2018-01-27 ENCOUNTER — Ambulatory Visit: Payer: BLUE CROSS/BLUE SHIELD | Admitting: Physical Therapy

## 2018-01-27 ENCOUNTER — Encounter: Payer: Self-pay | Admitting: Physical Therapy

## 2018-01-27 DIAGNOSIS — R2681 Unsteadiness on feet: Secondary | ICD-10-CM

## 2018-01-27 DIAGNOSIS — R278 Other lack of coordination: Secondary | ICD-10-CM | POA: Diagnosis not present

## 2018-01-27 DIAGNOSIS — M6281 Muscle weakness (generalized): Secondary | ICD-10-CM

## 2018-01-27 NOTE — Patient Instructions (Signed)
Stand in the corner with back to walls but not touching and chair back in front of you.   Feet Apart (Compliant Surface) Head Motion - Eyes Closed    Stand on compliant surface: ____pillow, folded blanket____ with feet shoulder width apart. Close eyes and move head slowly, up and down x 10, left and right x 10, each diagonal x 10  Do __1__ sessions per day.    Feet Partial Heel-Toe (Compliant Surface) Head Motion - Eyes Closed    Stand on compliant surface: _____pillow, etc___ with right foot partially in front of the other. Close eyes and hold for 30 seconds. *When able to do 30 seconds without touching walls, move head slowly, up and down. Left and right, diagonally x 10 each  Do __1__ sessions per day.  Copyright  [employer]HI. All rights reserved.    Sit to Stand Transfers:  1. Scoot out to the edge of the chair 2. Place your feet flat on the floor, shoulder width apart.  Make sure your feet are tucked just under your knees. 3. Lean forward (nose over toes) with momentum, and stand up tall with your best posture.  If you need to use your arms, use them as a quick boost up to stand. 4. If you are in a low or soft chair, you can lean back and then forward up to stand, in order to get more momentum. 5. Once you are standing, make sure you are looking ahead and standing tall.  To sit down:  1. Back up until you feel the chair behind your legs. 2. Bend at you hips, reaching  Back for you chair, if needed, then slowly squat to sit down on your chair.  HIP / KNEE: Extension - Sit to Stand    Sitting, lean chest forward, raise hips up from surface. Straighten hips and knees. Weight bear equally on left and right sides. Backs of legs should not push off surface. __10_ reps per set, __1-2_ sets per day, _5__ days per week Use assistive device as needed.     Tandem Walking    Walk beside counter with hand/fingers lightly touching with each foot directly in front of other, heel of one  foot touching toes of other foot with each step. Both feet straight ahead. Do for 2 minutes.      AMBULATION: Walk Backward    Beside counter with light hand/finger support, Walk backward. Take large steps, do not drag feet. Do for 2 minutes _5__ days per week Use assistive device. Walk to tempo of metronome or music.    Tandem Stance    Stand beside counter Right foot in front of left, heel touching toe both feet "straight ahead".  Balance in this position __30_ seconds. Do with left foot in front of right. Do 2-3 reps each foot in front.

## 2018-01-28 NOTE — Therapy (Signed)
[facility] [address] [place], [place], [address] Phone: [phone]   Fax:  [phone]  Physical Therapy Treatment  Patient Details  Name: Samantha [name] MRN: [id number] Date of Birth: 12/28/1985 Referring Provider: Joycelyn Schmid MD   Encounter Date: 01/27/2018  PT End of Session - 01/28/18 0909    Visit Number  2    Number of Visits  5 4 plus eval    Date for PT Re-Evaluation  02/17/18    Authorization Type  BCBS 30 VL PT/OT/Chiro    Authorization - Visit Number  9 7 OT to date, 1 PT    Authorization - Number of Visits  30    PT Start Time  0801    PT Stop Time  0844    PT Time Calculation (min)  43 min    Activity Tolerance  Patient tolerated treatment well    Behavior During Therapy  [facility] for tasks assessed/performed       Past Medical History:  Diagnosis Date  . Arthritis   . Chicken pox   . Chlamydia 12/02/2011  . Depression   . Fracture of left leg 2009   removal metal plate in left leg 2016  . Frequent headaches   . Infection    UTI  . Kidney stone    Kidney stone  . Migraines   . Neuropathy   . PCOS (polycystic ovarian syndrome)   . Tobacco abuse     Past Surgical History:  Procedure Laterality Date  . CESAREAN SECTION N/A 05/12/2013   Procedure: Primary cesarean section with delivery of baby boy at 0754.;  Surgeon: Freddrick March. Tenny Craw, MD;  Location: [facility];  Service: Obstetrics;  Laterality: N/A;  . REMOVAL OF IMPLANT Left 09/25/2015   Procedure: LEFT ANKLE REMOVAL OF DEEP  IMPLANT DEBRIDEMENT OF ANTERIOR MEDIAL ANKLE JOINT;  Surgeon: Toni Arthurs, MD;  Location: [facility];  Service: Orthopedics;  Laterality: Left;  . TIBIA FRACTURE SURGERY      There were no vitals filed for this visit.  Subjective Assessment - 01/27/18 0806    Subjective  I've been approved for the medication, but when I called to schedule they said they had to talk to my insurance. I've moved Monday to  my parents' house. Doing a lot of stairs now and I'm exhausted. Her room is in basement and son's is 2 levels above her.     How long can you stand comfortably?  not sure    How long can you walk comfortably?  not sure    Diagnostic tests  MRI brain and cervical spine +demyelinating disease    Patient Stated Goals  I don't want to fall anymore. Build up some strength and not stumble everywhere    Currently in Pain?  Yes    Pain Score  4     Pain Location  Ankle    Pain Orientation  Left    Pain Descriptors / Indicators  Aching    Pain Type  Chronic pain worse this week    Pain Onset  More than a month ago    Pain Frequency  Constant       Patient completed the following exercises/reps as educated for HEP:  Stand in the corner with back to walls but not touching and chair back in front of you.   Feet Apart (Compliant Surface) Head Motion - Eyes Closed    Stand on compliant surface: ____pillow, folded blanket____ with feet shoulder  width apart. Close eyes and move head slowly, up and down x 10, left and right x 10, each diagonal x 10  Do __1__ sessions per day.    Feet Partial Heel-Toe (Compliant Surface) Head Motion - Eyes Closed    Stand on compliant surface: _____pillow, etc___ with right foot partially in front of the other. Close eyes and hold for 30 seconds. *When able to do 30 seconds without touching walls, move head slowly, up and down. Left and right, diagonally x 10 each  Do __1__ sessions per day.  Copyright  VHI. All rights reserved.    Sit to Stand Transfers:  1. Scoot out to the edge of the chair 2. Place your feet flat on the floor, shoulder width apart.  Make sure your feet are tucked just under your knees. 3. Lean forward (nose over toes) with momentum, and stand up tall with your best posture.  If you need to use your arms, use them as a quick boost up to stand. 4. If you are in a low or soft chair, you can lean back and then forward up to stand, in order  to get more momentum. 5. Once you are standing, make sure you are looking ahead and standing tall.  To sit down:  1. Back up until you feel the chair behind your legs. 2. Bend at you hips, reaching  Back for you chair, if needed, then slowly squat to sit down on your chair.  HIP / KNEE: Extension - Sit to Stand    Sitting, lean chest forward, raise hips up from surface. Straighten hips and knees. Weight bear equally on left and right sides. Backs of legs should not push off surface. __10_ reps per set, __1-2_ sets per day, _5__ days per week Use assistive device as needed.     Tandem Walking    Walk beside counter with hand/fingers lightly touching with each foot directly in front of other, heel of one foot touching toes of other foot with each step. Both feet straight ahead. Do for 2 minutes.      AMBULATION: Walk Backward    Beside counter with light hand/finger support, Walk backward. Take large steps, do not drag feet. Do for 2 minutes _5__ days per week Use assistive device. Walk to tempo of metronome or music.    Tandem Stance    Stand beside counter Right foot in front of left, heel touching toe both feet "straight ahead".  Balance in this position __30_ seconds. Do with left foot in front of right. Do 2-3 reps each foot in front.                      Balance Exercises - 01/27/18 0844      Balance Exercises: Standing   Rockerboard  Anterior/posterior;EO fwd wt shift onto toes, hold 5 sec; hip hinge to wt on heels        PT Education - 01/28/18 0907    Education provided  Yes    Education Details  see pt instructions for HEP; need to plan her day so she makes as few trips up/down steps as possible    Person(s) Educated  Patient    Methods  Explanation;Demonstration;Handout    Comprehension  Need further instruction;Returned demonstration;Verbalized understanding          PT Long Term Goals - 01/14/18 0840      PT LONG TERM  GOAL #1   Title  Patient will be independent  with HEP for balance and strengthening and able to verbalize plan for community-based activity. (Target for all LTGs 02/24/18, date extended due to delayed start due to census)    Time  4    Period  Weeks    Status  New    Target Date  02/24/18      PT LONG TERM GOAL #2   Title  Patient will improve FGA to >=23/30 to demonstrate improved balance and lesser fall risk.     Baseline  4/12 16/30    Time  4    Period  Weeks    Status  New      PT LONG TERM GOAL #3   Title  Patient will improve gait velocity >=2.9 ft/sec demonstrating improved balance and lesser fall risk (age approp norm 3.8 ft/sec)    Baseline  4/12 2.67 ft/sec    Time  4    Period  Weeks    Status  New      PT LONG TERM GOAL #4   Title  Patient will improve 5x sit to stand to <=17 sec demonstrating improved LE strength.    Baseline  4/12 21.43 sec    Time  4    Period  Weeks    Status  New            Plan - 01/28/18 0910    Clinical Impression Statement  Session focused on educating in HEP for balance and LE strengthening. 2 MWT deferred as pt having ankle pain (thinks from standing so much while moving to parents' home). Discussed potential water Zumba class and pt to look into where there may be a class available. Pt can continue to benefit from PT to address balance, strength, and reducing fall risk.     Rehab Potential  Fair    Clinical Impairments Affecting Rehab Potential  recent flare with symptoms currently worsening;     PT Frequency  1x / week    PT Duration  4 weeks    PT Treatment/Interventions  ADLs/Self Care Home Management;Aquatic Therapy;Gait training;DME Instruction;Stair training;Functional mobility training;Therapeutic activities;Therapeutic exercise;Balance training;Neuromuscular re-education;Patient/family education;Passive range of motion;Vestibular    PT Next Visit Plan  ? do (norm 472 ft; MDC 22 ft); review HEP from 4/26-need to give her  walking program; did she find a water zumba class?    Consulted and Agree with Plan of Care  Patient       Patient will benefit from skilled therapeutic intervention in order to improve the following deficits and impairments:  Abnormal gait, Decreased activity tolerance, Decreased balance, Decreased mobility, Decreased knowledge of use of DME, Decreased knowledge of precautions, Decreased endurance, Decreased coordination, Decreased range of motion, Decreased strength, Dizziness, Difficulty walking, Increased muscle spasms, Impaired sensation, Impaired UE functional use, Obesity, Pain  Visit Diagnosis: Muscle weakness (generalized)  Unsteadiness on feet     Problem List Patient Active Problem List   Diagnosis Date Noted  . MS (multiple sclerosis) (HCC) 12/16/2017  . Demyelinating disease (HCC) 12/08/2017  . Tobacco abuse     Zena Amos, PT 01/28/2018, 9:15 AM  [facility] [address] [place], [place], [address] Phone: [phone]   Fax:  [phone]  Name: MATTY DEAMER MRN: [id number] Date of Birth: 04/06/1986

## 2018-01-31 ENCOUNTER — Encounter: Payer: BLUE CROSS/BLUE SHIELD | Admitting: Occupational Therapy

## 2018-01-31 ENCOUNTER — Ambulatory Visit: Payer: BLUE CROSS/BLUE SHIELD | Admitting: Physical Therapy

## 2018-02-02 ENCOUNTER — Telehealth: Payer: Self-pay | Admitting: *Deleted

## 2018-02-02 ENCOUNTER — Encounter: Payer: BLUE CROSS/BLUE SHIELD | Admitting: Occupational Therapy

## 2018-02-02 NOTE — Telephone Encounter (Signed)
Pt started Tysabri today with  [facility].

## 2018-02-03 ENCOUNTER — Ambulatory Visit: Payer: BLUE CROSS/BLUE SHIELD | Admitting: Physical Therapy

## 2018-02-03 ENCOUNTER — Ambulatory Visit: Payer: BLUE CROSS/BLUE SHIELD | Attending: Nephrology | Admitting: Occupational Therapy

## 2018-02-03 ENCOUNTER — Encounter: Payer: Self-pay | Admitting: Physical Therapy

## 2018-02-03 DIAGNOSIS — M6281 Muscle weakness (generalized): Secondary | ICD-10-CM | POA: Insufficient documentation

## 2018-02-03 NOTE — Therapy (Signed)
[facility] [address] [place], [place], [address] Phone: [phone]   Fax:  [phone]  Occupational Therapy Treatment  Patient Details  Name: Samantha [name] MRN: [id number] Date of Birth: 06-29-1986 Referring Provider: Joycelyn Schmid MD   Encounter Date: 02/03/2018  OT End of Session - 02/03/18 1015    Visit Number  8    Number of Visits  17    Date for OT Re-Evaluation  02/28/18 extended 2 weeks d/t missing 2 weeks of OT   Authorization Type  BC/BS    OT Start Time  0935    OT Stop Time  1015    OT Time Calculation (min)  40 min    Activity Tolerance  Patient tolerated treatment well    Behavior During Therapy  [facility] for tasks assessed/performed       Past Medical History:  Diagnosis Date  . Arthritis   . Chicken pox   . Chlamydia 12/02/2011  . Depression   . Fracture of left leg 2009   removal metal plate in left leg 2016  . Frequent headaches   . Infection    UTI  . Kidney stone    Kidney stone  . Migraines   . Neuropathy   . PCOS (polycystic ovarian syndrome)   . Tobacco abuse     Past Surgical History:  Procedure Laterality Date  . CESAREAN SECTION N/A 05/12/2013   Procedure: Primary cesarean section with delivery of baby boy at 0754.;  Surgeon: Freddrick March. Tenny Craw, MD;  Location: [facility];  Service: Obstetrics;  Laterality: N/A;  . REMOVAL OF IMPLANT Left 09/25/2015   Procedure: LEFT ANKLE REMOVAL OF DEEP  IMPLANT DEBRIDEMENT OF ANTERIOR MEDIAL ANKLE JOINT;  Surgeon: Toni Arthurs, MD;  Location: [facility];  Service: Orthopedics;  Laterality: Left;  . TIBIA FRACTURE SURGERY      There were no vitals filed for this visit.  Subjective Assessment - 02/03/18 0945    Subjective   I started the IV treatment yesterday. I moved into my parents house last week    Pertinent History  recently diagnosed with MS    Patient Stated Goals  to improve coordination, grip strength, and writing     Currently in Pain?  Yes    Pain Score  5     Pain Location  Hand    Pain Orientation  Right;Left    Pain Descriptors / Indicators  Burning;Tingling;Aching    Pain Type  Chronic pain    Pain Onset  More than a month ago    Pain Frequency  Constant    Aggravating Factors   nothing    Pain Relieving Factors  nothing                   OT Treatments/Exercises (OP) - 02/03/18 0001      Shoulder Exercises: ROM/Strengthening   UBE (Upper Arm Bike)  UBE x 5 min. Level 3 for strength/endurance    Other ROM/Strengthening Exercises  Theraband HEP issued - pt demo each bilaterally x 10 reps with yellow resistance band for UB strength/endurance             OT Education - 02/03/18 1003    Education provided  Yes    Education Details  Theraband HEP     Person(s) Educated  Patient    Methods  Explanation;Demonstration;Handout    Comprehension  Verbalized understanding;Returned demonstration       OT Short Term Goals -  01/11/18 1336      OT SHORT TERM GOAL #1   Title  Independent with HEP for coordination, hand strength, UE ROM/endurance bilaterally    Time  4    Period  Weeks    Status  Achieved      OT SHORT TERM GOAL #2   Title  Pt to verbalize understanding with energy conservation techniques    Time  4    Period  Weeks    Status  Achieved      OT SHORT TERM GOAL #3   Title  Pt to report greater ease with dressing and bathing using A/E prn    Time  4    Period  Weeks    Status  Achieved      OT SHORT TERM GOAL #4   Title  Pt to improve grip strength Lt hand by 8 lbs     Baseline  10 lbs    Time  4    Period  Weeks    Status  Achieved Lt = 30 lbs      OT SHORT TERM GOAL #5   Title  Pt to improve bilateral coordination by reducing speed on 9 hole peg test by 5 sec.     Baseline  Rt/Lt = 41 sec    Time  4    Period  Weeks    Status  Achieved Rt = 24 sec, Lt = 30 sec        OT Long Term Goals - 01/11/18 1336      OT LONG TERM GOAL #1   Title  Pt  to return to simple cooking tasks with rest breaks and A/E prn    Time  8    Period  Weeks    Status  On-going Pt reports doing at home      OT LONG TERM GOAL #2   Title  Pt to return to light household cleaning/management with rest breaks and A/E prn    Time  8    Period  Weeks    Status  On-going Pt reports doing at home      OT LONG TERM GOAL #3   Title  Pt to improve Rt grip strength to 40 lbs, and Lt grip strength to 25 lbs for opening jars/containers    Baseline  Rt = 35 lbs, Lt = 10 lbs    Time  8    Period  Weeks    Status  Achieved Rt = 48 lbs, Lt = 30 lbs      OT LONG TERM GOAL #4   Title  Pt to verbalize understanding with pain management strategies, safety considerations for lack of sensation, and general guidelines for MS    Time  8    Period  Weeks    Status  On-going            Plan - 02/03/18 1016    Clinical Impression Statement  Pt began IV treatment yesterday at [facility]. The medication (tysabri) was not listed as an option in medication list when this O.T. tried to add to meds list. Pt tolerating theraband HEP well today    Occupational Profile and client history currently impacting functional performance  Current limitations effect ADLS, IADLS, childcare, and pt had to recently quit job.     Occupational performance deficits (Please refer to evaluation for details):  ADL's;IADL's;Work;Social Participation;Leisure;Other    Rehab Potential  Good    OT Frequency  2x / week  OT Duration  8 weeks    OT Treatment/Interventions  Self-care/ADL training;DME and/or AE instruction;Splinting;Aquatic Therapy;Therapeutic activities;Therapeutic exercise;Cognitive remediation/compensation;Coping strategies training;Neuromuscular education;Functional Mobility Training;Passive range of motion;Visual/perceptual remediation/compensation;Energy conservation;Manual Therapy;Patient/family education    Plan  cooking task   Consulted and Agree with Plan of Care  Patient        Patient will benefit from skilled therapeutic intervention in order to improve the following deficits and impairments:  Decreased coordination, Decreased range of motion, Impaired flexibility, Decreased endurance, Impaired sensation, Decreased coping skills, Decreased activity tolerance, Decreased knowledge of precautions, Decreased balance, Impaired UE functional use, Pain, Decreased knowledge of use of DME, Decreased cognition, Decreased mobility, Decreased strength, Impaired vision/preception  Visit Diagnosis: Muscle weakness (generalized)    Problem List Patient Active Problem List   Diagnosis Date Noted  . MS (multiple sclerosis) (HCC) 12/16/2017  . Demyelinating disease (HCC) 12/08/2017  . Tobacco abuse     Kelli Churn, OTR/L 02/03/2018, 10:21 AM  [facility] [address] [place], [place], [address] Phone: [phone]   Fax:  [phone]  Name: ALIAYAH TYER MRN: [id number] Date of Birth: Dec 07, 1985

## 2018-02-03 NOTE — Patient Instructions (Signed)
   Strengthening: Resisted Flexion   Hold tubing with __Rt, then Lt___ arm(s) at side. Pull forward and up. Move shoulder through pain-free range of motion. Repeat __10__ times per set.  Do _1-2_ sessions per day , every other day   Strengthening: Resisted Extension   Hold tubing in __Rt, then Lt___ hand(s), arm forward. Pull arm back, elbow straight. Repeat _10___ times per set. Do _1-2___ sessions per day, every other day.   Resisted Horizontal Abduction: Bilateral   Sit or stand, tubing in both hands, arms out in front. Keeping arms straight, pinch shoulder blades together and stretch arms out. Repeat _10___ times per set. Do _1-2___ sessions per day, every other day.   Elbow Flexion: Resisted   With tubing held in __Rt, then Lt____ hand(s) and other end secured under foot, curl arm up as far as possible. Repeat _10___ times per set. Do _1-2___ sessions per day, every other day.    Elbow Extension: Resisted   Sit in chair with resistive band secured at armrest (or hold with other hand) and ___Rt, then Lt____ elbow bent. Straighten elbow. Repeat _10___ times per set.  Do _1-2___ sessions per day, every other day.   Copyright  VHI. All rights reserved.

## 2018-02-03 NOTE — Patient Instructions (Signed)
   Printed off [employer] fitness class schedule (they have a Ambulance person" class) and several other types of classes. Included pricing

## 2018-02-04 NOTE — Therapy (Signed)
[facility] [address] [place], [place], [address] Phone: [phone]   Fax:  [phone]  Physical Therapy Treatment  Patient Details  Name: Samantha [name] MRN: [id number] Date of Birth: [birth year] Referring Provider: Joycelyn Schmid MD   Encounter Date: 02/03/2018  PT End of Session - 02/03/18 1100    Visit Number  3    Number of Visits  5 4 plus eval    Date for PT Re-Evaluation  02/17/18    Authorization Type  BCBS 30 VL PT/OT/Chiro    Authorization - Visit Number  11 5/3 8 OT to date, 3 PT    Authorization - Number of Visits  30    PT Start Time  1017 late from OT session    PT Stop Time  1046    PT Time Calculation (min)  29 min    Activity Tolerance  Patient limited by fatigue    Behavior During Therapy  [facility] for tasks assessed/performed       Past Medical History:  Diagnosis Date  . Arthritis   . Chicken pox   . Chlamydia 12/02/2011  . Depression   . Fracture of left leg 2009   removal metal plate in left leg 2016  . Frequent headaches   . Infection    UTI  . Kidney stone    Kidney stone  . Migraines   . Neuropathy   . PCOS (polycystic ovarian syndrome)   . Tobacco abuse     Past Surgical History:  Procedure Laterality Date  . CESAREAN SECTION N/A 05/12/2013   Procedure: Primary cesarean section with delivery of baby boy at 0754.;  Surgeon: Freddrick March. Tenny Craw, MD;  Location: [facility];  Service: Obstetrics;  Laterality: N/A;  . REMOVAL OF IMPLANT Left 09/25/2015   Procedure: LEFT ANKLE REMOVAL OF DEEP  IMPLANT DEBRIDEMENT OF ANTERIOR MEDIAL ANKLE JOINT;  Surgeon: Toni Arthurs, MD;  Location: [facility];  Service: Orthopedics;  Laterality: Left;  . TIBIA FRACTURE SURGERY      There were no vitals filed for this visit.  Subjective Assessment - 02/03/18 1020    Subjective  Started infusion medicine (tysabri) yesterday. She'll have it every 28 days. Feeling very tired today and not sure  why. (Looked up medicine side effects and fatigue was one of the most common). States she is going to see her son in the [employer] [place] today. Going alone and worried about parking and how far she will have to walk. She has not attempted to find a water zumba class locally due to busy week with moving to parents' home.    How long can you stand comfortably?  not sure    How long can you walk comfortably?  not sure    Diagnostic tests  MRI brain and cervical spine +demyelinating disease    Patient Stated Goals  I don't want to fall anymore. Build up some strength and not stumble everywhere    Currently in Pain?  Yes    Pain Score  6     Pain Location  Hand    Pain Orientation  Right;Left    Pain Descriptors / Indicators  Burning;Tingling;Aching    Pain Radiating Towards  Lt hand only; rt numbness goes up into forearem    Pain Onset  More than a month ago    Pain Frequency  Constant         [facility] PT Assessment - 02/03/18 0001  6 Minute Walk- Baseline   6 Minute Walk- Baseline  yes    BP (mmHg)  111/80    HR (bpm)  78    02 Sat (%RA)  97 %    Modified Borg Scale for Dyspnea  0- Nothing at all    Perceived Rate of Exertion (Borg)  9- very light      6 Minute walk- Post Test   6 Minute Walk Post Test  yes    BP (mmHg)  108/72    HR (bpm)  80    02 Sat (%RA)  96 %    Modified Borg Scale for Dyspnea  0- Nothing at all    Perceived Rate of Exertion (Borg)  13- Somewhat hard      6 minute walk test results    Aerobic Endurance Distance Walked  307    Endurance additional comments  Only did 2 MWT due to level of fatigue/MS diagnosis; norm 472 ft; MDC 22 ft)                   [facility] Adult PT Treatment/Exercise - 02/03/18 1054      Ambulation/Gait   Ambulation/Gait  Yes    Ambulation/Gait Assistance  7: Independent    Ambulation Distance (Feet)  307 Feet see also (only did 2 MWT)    Assistive device  None    Gait Pattern  Decreased arm swing - right;Decreased  arm swing - left;Decreased step length - left;Wide base of support    Ambulation Surface  Indoor             PT Education - 02/03/18 1058    Education Details  normal BP response to exercise (her BP decreased by 3 mmHg SBP and 8 mmHG DBP during ) we will more closely monitor her BP pre- and post- exercise to see if this is a trend for her (or ? related to new medication/infusion 5/2)    Person(s) Educated  Patient    Methods  Explanation    Comprehension  Verbalized understanding          PT Long Term Goals - 01/14/18 0840      PT LONG TERM GOAL #1   Title  Patient will be independent with HEP for balance and strengthening and able to verbalize plan for community-based activity. (Target for all LTGs 02/24/18, date extended due to delayed start due to census)    Time  4    Period  Weeks    Status  New    Target Date  02/24/18      PT LONG TERM GOAL #2   Title  Patient will improve FGA to >=23/30 to demonstrate improved balance and lesser fall risk.     Baseline  4/12 16/30    Time  4    Period  Weeks    Status  New      PT LONG TERM GOAL #3   Title  Patient will improve gait velocity >=2.9 ft/sec demonstrating improved balance and lesser fall risk (age approp norm 3.8 ft/sec)    Baseline  4/12 2.67 ft/sec    Time  4    Period  Weeks    Status  New      PT LONG TERM GOAL #4   Title  Patient will improve 5x sit to stand to <=17 sec demonstrating improved LE strength.    Baseline  4/12 21.43 sec    Time  4    Period  Weeks    Status  New            Plan - 02/04/18 0715    Clinical Impression Statement  Patient comes to PT directly from OT session and reports she has been excessively fatigued all day. Upon researching side effects of Tysabri infusion, found fatigue to be one of the most common side effects. Session focused on discussion of ways to alter her routine to minimize energy expenditure. Specifically problem-solved strategies for her to attend her  son's [employer] happening today (parking, standing in sun, using first aid station if needed--esp for wheelchair if available). Discussed previous recommendation for water zumba class as she has enjoyed regular zumba in the past. Assisted patient to find a local class and information printed for her. Finally, completed 2 MWT for baseline exercise tolerance to assist with exercise prescription. Noted decrease in BP immediately AFTER exercise. Will continue to closely monitor during future PT appointments.     Rehab Potential  Fair    Clinical Impairments Affecting Rehab Potential  recent flare with symptoms currently worsening;     PT Frequency  1x / week    PT Duration  4 weeks    PT Treatment/Interventions  ADLs/Self Care Home Management;Aquatic Therapy;Gait training;DME Instruction;Stair training;Functional mobility training;Therapeutic activities;Therapeutic exercise;Balance training;Neuromuscular re-education;Patient/family education;Passive range of motion;Vestibular    PT Next Visit Plan  BP dropped during ; monitor BP pre- & post-activity;  DISCUSS VL and her goal/focus;  review HEP given 4/26 for technique; discuss and give info on walking program (but preferably will do water classes as heading into summer)    Consulted and Agree with Plan of Care  Patient       Patient will benefit from skilled therapeutic intervention in order to improve the following deficits and impairments:  Abnormal gait, Decreased activity tolerance, Decreased balance, Decreased mobility, Decreased knowledge of use of DME, Decreased knowledge of precautions, Decreased endurance, Decreased coordination, Decreased range of motion, Decreased strength, Dizziness, Difficulty walking, Increased muscle spasms, Impaired sensation, Impaired UE functional use, Obesity, Pain  Visit Diagnosis: Muscle weakness (generalized)     Problem List Patient Active Problem List   Diagnosis Date Noted  . MS (multiple  sclerosis) (HCC) 12/16/2017  . Demyelinating disease (HCC) 12/08/2017  . Tobacco abuse     Zena Amos, PT 02/04/2018, 7:27 AM  [facility] [address] [place], [place], [address] Phone: [phone]   Fax:  [phone]  Name: AUDELIA LESSO MRN: [id number] Date of Birth: 1986-06-23

## 2018-02-06 ENCOUNTER — Ambulatory Visit: Payer: BLUE CROSS/BLUE SHIELD | Admitting: Physical Therapy

## 2018-02-06 ENCOUNTER — Ambulatory Visit: Payer: BLUE CROSS/BLUE SHIELD | Admitting: Occupational Therapy

## 2018-02-07 NOTE — Telephone Encounter (Signed)
MS Touch: Tysabri infusions authorized through 08/03/18, to be givien in [facility] infusion suite.

## 2018-02-08 ENCOUNTER — Ambulatory Visit: Payer: BLUE CROSS/BLUE SHIELD | Admitting: Occupational Therapy

## 2018-02-08 ENCOUNTER — Telehealth: Payer: Self-pay | Admitting: Diagnostic Neuroimaging

## 2018-02-08 NOTE — Telephone Encounter (Signed)
Spoke with patient. She said she stopped taking Tizanidine because it made her head "feel funny" and feel dizzy. She is not taking anything. She had her first Tysabri infusion last Thursday. These symptoms were already present but have just gotten worse. The patient is aware that Dr. Marjory Lies is out of the office right now but RN will send message. Will call pt back tomorrow. She verbalized appreciation.

## 2018-02-08 NOTE — Telephone Encounter (Signed)
Pt is asking for a call back because of her experiencing tremors , neck hurting badly and her hands locking up on her.  Pt not sure if this is a small flare up.  Please call

## 2018-02-09 MED ORDER — BACLOFEN 10 MG PO TABS: 5.0000 mg | ORAL_TABLET | Freq: Two times a day (BID) | ORAL | 3 refills | Status: DC

## 2018-02-09 NOTE — Telephone Encounter (Signed)
I will switch tizanidine to baclofen for muscle spasms.  Meds ordered this encounter  Medications  . baclofen (LIORESAL) 10 MG tablet    Sig: Take 0.5-1 tablets (5-10 mg total) by mouth 2 (two) times daily.    Dispense:  60 each    Refill:  3    Suanne Marker, MD 02/09/2018, 12:11 PM Certified in Neurology, Neurophysiology and Neuroimaging  [facility] [address] [place], [place] [address] [phone]

## 2018-02-09 NOTE — Telephone Encounter (Signed)
Spoke with patient to inform her of new Rx. She stated she has been on Baclofen in the past and had the same side effects. She stated she was on a third muscle relaxer too, that caused same side effects, but she cannot remember the name of it. Patient's next Tysabri infusion is May 30th. She stated "I'll just wing it. I know it'll take a while for the medicine to start working."  This RN advised will add Baclofen and Tizanidine to allergy list as intolerance. Advised will let Dr Marjory Lies know and call her back if he has other recommendations. She verbalized understanding, appreciation.

## 2018-02-09 NOTE — Addendum Note (Signed)
Addended by: Joycelyn Schmid R on: 02/09/2018 12:16 PM   Modules accepted: Orders

## 2018-02-13 ENCOUNTER — Ambulatory Visit: Payer: BLUE CROSS/BLUE SHIELD | Admitting: Physical Therapy

## 2018-02-13 ENCOUNTER — Encounter: Payer: BLUE CROSS/BLUE SHIELD | Admitting: Occupational Therapy

## 2018-02-13 NOTE — Therapy (Signed)
[place] [address] [place], [place], [address] Phone: [phone]   Fax:  [phone]  Patient Details  Name: Samantha [name] MRN: [id number] Date of Birth: 08-23-1986 Referring Provider:  Dr. Leta Baptist Encounter Date: 02/13/2018  OCCUPATIONAL THERAPY DISCHARGE SUMMARY  Visits from Start of Care: 8  Current functional level related to goals / functional outcomes: OT Short Term Goals - 01/11/18 1336      OT SHORT TERM GOAL #1   Title  Independent with HEP for coordination, hand strength, UE ROM/endurance bilaterally    Time  4    Period  Weeks    Status  Achieved      OT SHORT TERM GOAL #2   Title  Pt to verbalize understanding with energy conservation techniques    Time  4    Period  Weeks    Status  Achieved      OT SHORT TERM GOAL #3   Title  Pt to report greater ease with dressing and bathing using A/E prn    Time  4    Period  Weeks    Status  Achieved      OT SHORT TERM GOAL #4   Title  Pt to improve grip strength Lt hand by 8 lbs     Baseline  10 lbs    Time  4    Period  Weeks    Status  Achieved Lt = 30 lbs      OT SHORT TERM GOAL #5   Title  Pt to improve bilateral coordination by reducing speed on 9 hole peg test by 5 sec.     Baseline  Rt/Lt = 41 sec    Time  4    Period  Weeks    Status  Achieved Rt = 24 sec, Lt = 30 sec      OT Long Term Goals - 01/11/18 1336      OT LONG TERM GOAL #1   Title  Pt to return to simple cooking tasks with rest breaks and A/E prn    Time  8    Period  Weeks    Status  On-going Pt reports doing at home      OT LONG TERM GOAL #2   Title  Pt to return to light household cleaning/management with rest breaks and A/E prn    Time  8    Period  Weeks    Status  On-going Pt reports doing at home      OT LONG TERM GOAL #3   Title  Pt to improve Rt grip strength to 40 lbs, and Lt grip strength to 25 lbs for opening jars/containers    Baseline  Rt = 35  lbs, Lt = 10 lbs    Time  8    Period  Weeks    Status  Achieved Rt = 48 lbs, Lt = 30 lbs      OT LONG TERM GOAL #4   Title  Pt to verbalize understanding with pain management strategies, safety considerations for lack of sensation, and general guidelines for MS    Time  8    Period  Weeks    Status  On-going - pt did not return to fully assess        Remaining deficits: Coordination, strength, endurance   Education / Equipment: HEP's, A/E recommendations, safety strategies, MS Society and support group info  Plan: Patient agrees to discharge.  Patient goals were  partially met. Patient is being discharged due to the patient's request.  (Pt called today to cancel remaining appointments stating she "no longer wants therapy" per front office message)?????       Carey Bullocks, OTR/L 02/13/2018, 10:46 AM  [place] [address] [place], [place], [address] Phone: [phone]   Fax:  [phone]

## 2018-02-14 ENCOUNTER — Ambulatory Visit: Payer: BLUE CROSS/BLUE SHIELD | Admitting: Occupational Therapy

## 2018-02-19 ENCOUNTER — Other Ambulatory Visit: Payer: Self-pay | Admitting: Neurology

## 2018-03-28 ENCOUNTER — Telehealth: Payer: Self-pay | Admitting: *Deleted

## 2018-03-28 NOTE — Telephone Encounter (Signed)
Received call from Raynelle Fanning, RN/ [facility] neuro. She stated the patient was a self-referral to their dept. She has been seen for consultation and is to receive Tysabri. Raynelle Fanning inquired about her JCV lab result and if she has received Tysabri infusions in this office. This RN advised her the patient received Tysabri on 5/2 and 03/02/18. Raynelle Fanning verbalized understanding, appreciation of information. Dr Marjory Lies advised of patient transferring her care and that she cancelled her FU this week via automated system. Inetta Fermo RN, infusion suite made aware. Patient is scheduled to receive Tysabri in this office on 03/30/18.

## 2018-03-29 ENCOUNTER — Ambulatory Visit: Payer: Self-pay | Admitting: Diagnostic Neuroimaging

## 2018-05-09 ENCOUNTER — Encounter (HOSPITAL_COMMUNITY): Payer: Self-pay

## 2018-05-09 ENCOUNTER — Emergency Department (HOSPITAL_COMMUNITY)
Admission: EM | Admit: 2018-05-09 | Discharge: 2018-05-10 | Disposition: A | Discharge status: Home / Self Care | Payer: BLUE CROSS/BLUE SHIELD | Attending: Emergency Medicine | Admitting: Emergency Medicine

## 2018-05-09 DIAGNOSIS — F17210 Nicotine dependence, cigarettes, uncomplicated: Secondary | ICD-10-CM | POA: Diagnosis not present

## 2018-05-09 DIAGNOSIS — M549 Dorsalgia, unspecified: Secondary | ICD-10-CM

## 2018-05-09 DIAGNOSIS — G35 Multiple sclerosis: Secondary | ICD-10-CM | POA: Insufficient documentation

## 2018-05-09 DIAGNOSIS — R5383 Other fatigue: Secondary | ICD-10-CM | POA: Diagnosis not present

## 2018-05-09 DIAGNOSIS — M791 Myalgia, unspecified site: Secondary | ICD-10-CM

## 2018-05-09 DIAGNOSIS — Z79899 Other long term (current) drug therapy: Secondary | ICD-10-CM | POA: Diagnosis not present

## 2018-05-09 DIAGNOSIS — M5489 Other dorsalgia: Secondary | ICD-10-CM | POA: Diagnosis not present

## 2018-05-09 DIAGNOSIS — M7918 Myalgia, other site: Secondary | ICD-10-CM | POA: Diagnosis not present

## 2018-05-09 DIAGNOSIS — R410 Disorientation, unspecified: Secondary | ICD-10-CM | POA: Diagnosis present

## 2018-05-09 LAB — CBC WITH DIFFERENTIAL/PLATELET
ABS IMMATURE GRANULOCYTES: 0 10*3/uL (ref 0.0–0.1)
BASOS ABS: 0.1 10*3/uL (ref 0.0–0.1)
BASOS PCT: 1 %
EOS ABS: 0.3 10*3/uL (ref 0.0–0.7)
Eosinophils Relative: 3 %
HCT: 46.1 % — ABNORMAL HIGH (ref 36.0–46.0)
Hemoglobin: 14.2 g/dL (ref 12.0–15.0)
Immature Granulocytes: 0 %
Lymphocytes Relative: 20 %
Lymphs Abs: 1.8 10*3/uL (ref 0.7–4.0)
MCH: 27.4 pg (ref 26.0–34.0)
MCHC: 30.8 g/dL (ref 30.0–36.0)
MCV: 89 fL (ref 78.0–100.0)
Monocytes Absolute: 0.5 10*3/uL (ref 0.1–1.0)
Monocytes Relative: 6 %
NEUTROS ABS: 6.1 10*3/uL (ref 1.7–7.7)
Neutrophils Relative %: 70 %
PLATELETS: 440 10*3/uL — AB (ref 150–400)
RBC: 5.18 MIL/uL — ABNORMAL HIGH (ref 3.87–5.11)
RDW: 13.6 % (ref 11.5–15.5)
WBC: 8.8 10*3/uL (ref 4.0–10.5)

## 2018-05-09 LAB — URINALYSIS, ROUTINE W REFLEX MICROSCOPIC
BILIRUBIN URINE: NEGATIVE
GLUCOSE, UA: NEGATIVE mg/dL
HGB URINE DIPSTICK: NEGATIVE
KETONES UR: NEGATIVE mg/dL
Leukocytes, UA: NEGATIVE
Nitrite: NEGATIVE
Protein, ur: NEGATIVE mg/dL
Specific Gravity, Urine: 1.028 (ref 1.005–1.030)
pH: 6 (ref 5.0–8.0)

## 2018-05-09 LAB — BASIC METABOLIC PANEL
Anion gap: 9 (ref 5–15)
BUN: 9 mg/dL (ref 6–20)
CALCIUM: 9.2 mg/dL (ref 8.9–10.3)
CO2: 27 mmol/L (ref 22–32)
CREATININE: 0.96 mg/dL (ref 0.44–1.00)
Chloride: 105 mmol/L (ref 98–111)
GFR calc Af Amer: >60 mL/min (ref >60)
GLUCOSE: 87 mg/dL (ref 70–99)
Potassium: 3.7 mmol/L (ref 3.5–5.1)
SODIUM: 141 mmol/L (ref 135–145)

## 2018-05-09 NOTE — ED Notes (Signed)
Called Patient to reassess vitals x3 and had no response. 

## 2018-05-09 NOTE — ED Triage Notes (Signed)
Pt presents with 2 day h/o upper back pain that radiates into her neck, L leg pain, bilateral hand pain, difficulty speaking and confusion.  Pt reports extreme fatigue x 1 week, last had treatment on 8/26.

## 2018-05-10 ENCOUNTER — Emergency Department (HOSPITAL_COMMUNITY): Payer: BLUE CROSS/BLUE SHIELD

## 2018-05-10 NOTE — ED Provider Notes (Signed)
Signed out to d/c to home if MR without acute process.  Mri neg for cva. Neg for acute ms lesions.   pts symptoms are resolved. No new numbness/weakness. No change in speech.   Patient currently appears stable for d/c.      Cathren Laine, MD 05/10/18 1003

## 2018-05-10 NOTE — Discharge Instructions (Addendum)
It was our pleasure to provide your ER care today - we hope that you feel better.  You MRI appears improved from prior.   We recommend follow up with your doctor/neurologist in the coming week.  Drink adequate fluids. Take acetaminophen and/or ibuprofen as need.   Return to ER if worse, new symptoms, fevers, trouble breathing, new numbness/weakness, other concern.

## 2018-05-10 NOTE — ED Notes (Signed)
Pt back from MRI at this time

## 2018-05-10 NOTE — ED Notes (Signed)
Pt went to MRI.

## 2018-05-10 NOTE — ED Provider Notes (Signed)
[facility] EMERGENCY DEPARTMENT Provider Note   CSN: [id number] Arrival date & time: 05/09/18  2125     History   Chief Complaint No chief complaint on file.   HPI Samantha [name] is a 32 y.o. female.  HPI  This is a 32 year old female with a history of recent diagnosis of MS who presents with speech difficulty and back burning.  Patient reports that on Monday she noted increasing confusion and word finding difficulty.  She also reports stuttering.  She states that these were her presenting symptoms for MS.  Since starting injections with Tysabri she states that she has mainly felt better has not had any recurrent symptoms.  She has not had any recent steroids.  She denies any vision changes, headache, weakness.  She reports generalized fatigue over the last several days as well as generalized weakness.  She reports chronic tingling in her bilateral hands.  This is unchanged.  No fevers or other infectious symptoms.  Reviewed her chart.  She is followed at [facility].  Just had a Tysabri infusion at the end of July.  Initial MS diagnosis with February 2019 with similar presentation with speech stuttering, confusion.  She previously has had migraines and eye symptoms prior to that.  Past Medical History:  Diagnosis Date  . Arthritis   . Chicken pox   . Chlamydia 12/02/2011  . Depression   . Fracture of left leg 2009   removal metal plate in left leg 2016  . Frequent headaches   . Infection    UTI  . Kidney stone    Kidney stone  . Migraines   . Neuropathy   . PCOS (polycystic ovarian syndrome)   . Tobacco abuse     Patient Active Problem List   Diagnosis Date Noted  . MS (multiple sclerosis) (HCC) 12/16/2017  . Demyelinating disease (HCC) 12/08/2017  . Tobacco abuse     Past Surgical History:  Procedure Laterality Date  . CESAREAN SECTION N/A 05/12/2013   Procedure: Primary cesarean section with delivery of baby boy at 0754.;  Surgeon: Freddrick March.  Tenny Craw, MD;  Location: [facility];  Service: Obstetrics;  Laterality: N/A;  . REMOVAL OF IMPLANT Left 09/25/2015   Procedure: LEFT ANKLE REMOVAL OF DEEP  IMPLANT DEBRIDEMENT OF ANTERIOR MEDIAL ANKLE JOINT;  Surgeon: Toni Arthurs, MD;  Location: [facility];  Service: Orthopedics;  Laterality: Left;  . TIBIA FRACTURE SURGERY       OB History    Gravida  1   Para  1   Term  1   Preterm      AB      Living  1     SAB      TAB      Ectopic      Multiple      Live Births  1            Home Medications    Prior to Admission medications   Medication Sig Start Date End Date Taking? Authorizing Provider  cholecalciferol (VITAMIN D) 1000 units tablet Take 1,000 Units by mouth daily.   Yes [provider]  baclofen (LIORESAL) 10 MG tablet Take 0.5-1 tablets (5-10 mg total) by mouth 2 (two) times daily. 02/09/18   Penumalli, Glenford Bayley, MD  dexamethasone (DECADRON) 2 MG tablet Take 3 tablets the first day, 2 the second and 1 the third day Patient not taking: Reported on 01/10/2018 01/01/18   York Spaniel, MD  DULoxetine (CYMBALTA) 30 MG capsule Take 2 capsules (60 mg total) by mouth daily. Patient not taking: Reported on 05/09/2018 01/05/18   Ardith Dark, MD  HYDROcodone-acetaminophen (NORCO/VICODIN) 5-325 MG tablet Take 1-2 tablets by mouth every 6 (six) hours as needed for moderate pain or severe pain. Patient not taking: Reported on 01/10/2018 12/12/17   Delano Metz, MD  naproxen (NAPROSYN) 500 MG tablet Take 1 tablet (500 mg total) by mouth 2 (two) times daily. Patient not taking: Reported on 05/09/2018 01/10/18   Emi Holes, PA-C  tizanidine (ZANAFLEX) 2 MG capsule Take 1 capsule (2 mg total) by mouth 3 (three) times daily. Patient not taking: Reported on 01/10/2018 12/29/17   Asa Lente, MD    Family History Family History  Problem Relation Age of Onset  . Hypertension Other   . Diabetes Other   . Ulcers Mother   . GER disease Mother   .  Asthma Mother   . Heart attack Mother   . Lupus Mother   . Cancer Father   . Drug abuse Father   . GER disease Sister   . Asthma Brother   . Birth defects Maternal Grandfather   . Heart attack Maternal Grandfather   . High blood pressure Maternal Grandfather   . Stroke Maternal Grandfather   . Autism Son   . Learning disabilities Son     Social History Social History   Tobacco Use  . Smoking status: Current Every Day Smoker    Packs/day: 0.25    Years: 2.00    Pack years: 0.50    Types: Cigarettes  . Smokeless tobacco: Never Used  . Tobacco comment: quit with preg  Substance Use Topics  . Alcohol use: No    Comment: socially none since pregnancy  . Drug use: No     Allergies   Baclofen and Tizanidine   Review of Systems Review of Systems  Constitutional: Positive for fatigue. Negative for fever.  Respiratory: Negative for shortness of breath.   Cardiovascular: Negative for chest pain.  Gastrointestinal: Negative for abdominal pain.  Genitourinary: Negative for dysuria.  Musculoskeletal: Negative for back pain.  Neurological: Positive for speech difficulty.  Psychiatric/Behavioral: Positive for confusion.  All other systems reviewed and are negative.    Physical Exam Updated Vital Signs BP (!) 136/92 (BP Location: Right Arm)   Pulse 62   Temp 98.6 F (37 C) (Oral)   Resp 18   Ht 5\' 5"  (1.651 m)   Wt 117.9 kg (260 lb)   SpO2 100%   BMI 43.27 kg/m   Physical Exam  Constitutional: She is oriented to person, place, and time. She appears well-developed and well-nourished.  HENT:  Head: Normocephalic and atraumatic.  Eyes: Pupils are equal, round, and reactive to light.  Neck: Neck supple.  Cardiovascular: Normal rate, regular rhythm and normal heart sounds.  No murmur heard. Pulmonary/Chest: Effort normal and breath sounds normal. No respiratory distress. She has no wheezes.  Abdominal: Soft. Bowel sounds are normal. There is no tenderness. There is  no guarding.  Neurological: She is alert and oriented to person, place, and time.  Fluent speech, patient can name and repeat, oriented x3, 5 out of 5 strength in all 4 extremities, no dysmetria to finger-nose-finger, no clonus, normal reflexes  Skin: Skin is warm and dry.  Psychiatric: She has a normal mood and affect.  Nursing note and vitals reviewed.    ED Treatments / Results  Labs (all labs ordered are listed,  but only abnormal results are displayed) Labs Reviewed  CBC WITH DIFFERENTIAL/PLATELET - Abnormal; Notable for the following components:      Result Value   RBC 5.18 (*)    HCT 46.1 (*)    Platelets 440 (*)    All other components within normal limits  URINALYSIS, ROUTINE W REFLEX MICROSCOPIC - Abnormal; Notable for the following components:   APPearance HAZY (*)    All other components within normal limits  BASIC METABOLIC PANEL    EKG None  Radiology No results found.  Procedures Procedures (including critical care time)  Medications Ordered in ED Medications - No data to display   Initial Impression / Assessment and Plan / ED Course  I have reviewed the triage vital signs and the nursing notes.  Pertinent labs & imaging results that were available during my care of the patient were reviewed by me and considered in my medical decision making (see chart for details).     Patient presents with symptoms that are similar to her presenting symptoms for MS when she was diagnosed in February.  Her neurologic exam is reassuring at this time.  She did just finish a injection at the end of July.  She is nontoxic-appearing and neurologically intact.  Question would be whether she would require admission for IV steroids for an acute flare.  Will obtain an MRI of the head to evaluate for progression of disease.  At this time she has no evidence of stuttering or speech difficulty.  Final Clinical Impressions(s) / ED Diagnoses   Final diagnoses:  None    ED  Discharge Orders    None       Horton, Mayer Masker, MD 05/10/18 [phone]

## 2018-05-18 ENCOUNTER — Encounter: Payer: Self-pay | Admitting: Physical Therapy

## 2018-05-18 NOTE — Therapy (Signed)
[place] [address] [place], [place], [address] Phone: [phone]   Fax:  [phone]  Patient Details  Name: Samantha [name] MRN: [id number] Date of Birth: April 14, 1986 Referring Provider:  No ref. provider found  Encounter Date: 05/18/2018 PHYSICAL THERAPY DISCHARGE SUMMARY  Visits from Start of Care: 3  Current functional level related to goals / functional outcomes: Unable to evaluate as pt did not return to PT   Remaining deficits: Unable to evaluate as pt did not return to PT   Education / Equipment: Unable to evaluate as pt did not return to PT  Plan: Patient agrees to discharge.  Patient goals were not met. Patient is being discharged due to not returning since the last visit.  ?????       Rexanne Mano, PT 05/18/2018, 8:35 AM  [facility] [address] [place], [place], [address] Phone: [phone]   Fax:  [phone]

## 2018-06-16 ENCOUNTER — Ambulatory Visit: Payer: BLUE CROSS/BLUE SHIELD | Admitting: Family Medicine

## 2018-06-20 ENCOUNTER — Ambulatory Visit: Payer: BLUE CROSS/BLUE SHIELD | Admitting: Family Medicine

## 2018-06-20 ENCOUNTER — Encounter: Payer: Self-pay | Admitting: Family Medicine

## 2018-06-20 DIAGNOSIS — G35 Multiple sclerosis: Secondary | ICD-10-CM | POA: Diagnosis not present

## 2018-06-20 DIAGNOSIS — Z720 Tobacco use: Secondary | ICD-10-CM

## 2018-06-20 DIAGNOSIS — G35D Multiple sclerosis, unspecified: Secondary | ICD-10-CM

## 2018-06-20 NOTE — Patient Instructions (Signed)
It was very nice to see you today!  I am so glad that you are doing better!  Keep up the good work!  No changes today.  Come back to see me in 6 months for your physical with blood work, or sooner as needed.  Take care, Dr [name]

## 2018-06-20 NOTE — Progress Notes (Signed)
   Subjective:  Samantha [name] is a 32 y.o. female who presents today with a chief complaint of MS follow up.   HPI:  MS, chronic problem, improving Last seen 6 months ago.  At that time she had just been diagnosed with a demyelinating disease.  She has since followed up with neurology and been diagnosed with multiple sclerosis.  She is currently on Tysabri which seems to be improving her symptoms.  Her speech is much better.  Coordination in her upper extremities is much better as well.  She unfortunately still has a little bit of numbness and tingling in both her hands and feet.  ROS: Per HPI  PMH: She reports that she has been smoking cigarettes. She has a 0.50 pack-year smoking history. She has never used smokeless tobacco. She reports that she does not drink alcohol or use drugs.  Objective:  Physical Exam: BP 130/74 (BP Location: Left Arm, Patient Position: Sitting, Cuff Size: Large)   Pulse 77   Temp 97.8 F (36.6 C) (Oral)   Ht 5\' 5"  (1.651 m)   Wt 260 lb 9.6 oz (118.2 kg)   SpO2 96%   BMI 43.37 kg/m   Gen: NAD, resting comfortably CV: RRR with no murmurs appreciated Pulm: NWOB, CTAB with no crackles, wheezes, or rhonchi Skin: Warm, dry Neuro: Cranial nerves II through XII intact.  Strength grossly intact in upper and lower extremities.  Assessment/Plan:  MS (multiple sclerosis) (HCC) Symptoms are much better compared to where she was 6 months ago.  She has not yet returned to work full-time per her neurologist recommendation.  Defer further management to neurology at this point.  Tobacco abuse Patient was asked about her tobacco use today and was strongly advised to quit. Patient is currently trying to wean down. We reviewed treatment options to assist her quit smoking including NRT, Chantix, and Bupropion.  She does not wish to start any medications today.  Follow up at next office visit.   Preventative Healthcare Patient was instructed to return for CPE in about 6  months. Due for lipid and DM screening.  Katina Degree. [name], MD 06/20/2018 11:55 AM

## 2018-06-20 NOTE — Assessment & Plan Note (Signed)
Patient was asked about her tobacco use today and was strongly advised to quit. Patient is currently trying to wean down. We reviewed treatment options to assist her quit smoking including NRT, Chantix, and Bupropion.  She does not wish to start any medications today.  Follow up at next office visit.

## 2018-06-20 NOTE — Assessment & Plan Note (Signed)
Symptoms are much better compared to where she was 6 months ago.  She has not yet returned to work full-time per her neurologist recommendation.  Defer further management to neurology at this point.

## 2018-07-10 ENCOUNTER — Ambulatory Visit: Payer: Self-pay | Admitting: Family Medicine

## 2018-07-10 ENCOUNTER — Encounter: Payer: Self-pay | Admitting: Internal Medicine

## 2018-07-10 ENCOUNTER — Ambulatory Visit (INDEPENDENT_AMBULATORY_CARE_PROVIDER_SITE_OTHER): Payer: BLUE CROSS/BLUE SHIELD | Admitting: Internal Medicine

## 2018-07-10 ENCOUNTER — Inpatient Hospital Stay (HOSPITAL_COMMUNITY)
Admission: AD | Admit: 2018-07-10 | Discharge: 2018-07-10 | Disposition: A | Discharge status: Home / Self Care | Payer: BLUE CROSS/BLUE SHIELD | Source: Ambulatory Visit | Attending: Obstetrics & Gynecology | Admitting: Obstetrics & Gynecology

## 2018-07-10 ENCOUNTER — Other Ambulatory Visit: Payer: Self-pay

## 2018-07-10 VITALS — BP 125/78 | HR 69 | Ht 65.0 in | Wt 257.7 lb

## 2018-07-10 DIAGNOSIS — Z3202 Encounter for pregnancy test, result negative: Secondary | ICD-10-CM | POA: Insufficient documentation

## 2018-07-10 DIAGNOSIS — N939 Abnormal uterine and vaginal bleeding, unspecified: Secondary | ICD-10-CM | POA: Insufficient documentation

## 2018-07-10 DIAGNOSIS — N938 Other specified abnormal uterine and vaginal bleeding: Secondary | ICD-10-CM

## 2018-07-10 LAB — URINALYSIS, ROUTINE W REFLEX MICROSCOPIC
BACTERIA UA: NONE SEEN
Bilirubin Urine: NEGATIVE
Glucose, UA: NEGATIVE mg/dL
Ketones, ur: NEGATIVE mg/dL
Nitrite: NEGATIVE
PROTEIN: 100 mg/dL — AB
RBC / HPF: >50 RBC/hpf — ABNORMAL HIGH (ref 0–5)
Specific Gravity, Urine: 1.021 (ref 1.005–1.030)
pH: 7 (ref 5.0–8.0)

## 2018-07-10 NOTE — Telephone Encounter (Signed)
No cycle on her own- patient is bleeding heavily. She bled through her clothing. Patient has PCOS- patient usually has to take provera. LMP- 04/2018. Patient does not use protection. Patient states she does not see a GYN at this time- per office- patient advised to go to MAU at [facility] for evaluation of bleeding, pregnancy status, CBC, [place] and possible medications to slow bleeding. Recommend patient follow up at office if she is not trying for pregnancy to discuss management of PCOS so this type of bleeding does not happen again.  Reason for Disposition . SEVERE vaginal bleeding (i.e., soaking 2 pads or tampons per hour and present 2 or more hours; 1 menstrual cup every 2 hours)  Answer Assessment - Initial Assessment Questions 1. AMOUNT: "Describe the bleeding that you are having."    - SPOTTING: spotting, or pinkish / brownish mucous discharge; does not fill panti-liner or pad    - MILD:  less than 1 pad / hour; less than patient's usual menstrual bleeding   - MODERATE: 1-2 pads / hour; 1 menstrual cup every 6 hours; small-medium blood clots (e.g., pea, grape, small coin)   - SEVERE: soaking 2 or more pads/hour for 2 or more hours; 1 menstrual cup every 2 hours; bleeding not contained by pads or continuous red blood from vagina; large blood clots (e.g., golf ball, large coin)      Severe- since yesterday- passing clots 2. ONSET: "When did the bleeding begin?" "Is it continuing now?"     Saturday- light- picked up yesterday 3. MENSTRUAL PERIOD: "When was the last normal menstrual period?" "How is this different than your period?"     PCOS- 7/19 4. REGULARITY: "How regular are your periods?"     Irregular cycles 5. ABDOMINAL PAIN: "Do you have any pain?" "How bad is the pain?"  (e.g., Scale 1-10; mild, moderate, or severe)   - MILD (1-3): doesn't interfere with normal activities, abdomen soft and not tender to touch    - MODERATE (4-7): interferes with normal activities or awakens from sleep, tender  to touch    - SEVERE (8-10): excruciating pain, doubled over, unable to do any normal activities      Yes- varies- severe to mild 6. PREGNANCY: "Could you be pregnant?" "Are you sexually active?" "Did you recently give birth?"     Yes- possibly, yes sexually active, no 7. BREASTFEEDING: "Are you breastfeeding?"     no 8. HORMONES: "Are you taking any hormone medications, prescription or OTC?" (e.g., birth control pills, estrogen)     No- was using IUD- removed 10/18 - patient has been using provera every 6 months 9. BLOOD THINNERS: "Do you take any blood thinners?" (e.g., Coumadin/warfarin, Pradaxa/dabigatran, aspirin)     no 10. CAUSE: "What do you think is causing the bleeding?" (e.g., recent gyn surgery, recent gyn procedure; known bleeding disorder, cervical cancer, polycystic ovarian disease, fibroids)         Patient takes treat monthly for MS, PCOS- build up, pregnancy 11. HEMODYNAMIC STATUS: "Are you weak or feeling lightheaded?" If so, ask: "Can you stand and walk normally?"        Weak, fatigued- lightheaded yesterday 12. OTHER SYMPTOMS: "What other symptoms are you having with the bleeding?" (e.g., passed tissue, vaginal discharge, fever, menstrual-type cramps)       cramping  Protocols used: VAGINAL BLEEDING - ABNORMAL-A-AH

## 2018-07-10 NOTE — MAU Provider Note (Signed)
Ms.Artesha I Eyster is a 32 y.o. female who presents to MAU today for vaginal bleeding.   BP 124/87 (BP Location: Right Arm)   Pulse 78   Temp 98.3 F (36.8 C) (Oral)   Resp 17   Wt 117.1 kg   LMP 07/08/2018   SpO2 99%   BMI 42.98 kg/m   CONSTITUTIONAL: Well-developed, well-nourished female in no acute distress.  RESPIRATORY: Normal effort NEUROLOGICAL: Alert and oriented to person, place, and time.  SKIN: No pallor. PSYCH: Normal mood and affect. Normal behavior. Normal judgment and thought content.  MDM No emergent condition presenting, offered outpt eval, pt agrees.  A: Vaginal bleeding Pregnancy test negative  P: Evaluation in GYN walk-in clinic today Patient may return to MAU as needed or if her condition were to change or worsen   Donette Larry, [place]  07/10/2018 5:06 PM

## 2018-07-10 NOTE — Progress Notes (Signed)
Pt states that she has never had a spontaneous period since age 31. In July she saw a physician @ [facility] and was given Rx for provera challenge which did produce a withdrawal bleed. Currently she started having heavy bleeding 2 days ago w/clots. She is not sure if this is normal or what she needs to do.

## 2018-07-10 NOTE — MAU Note (Signed)
Doesn't know if she is having a period or a miscarriage.  First time she has had a period on her on in 75yrs.  Usually has to take Provera to start cycle.  No preg test.  Bleeding heavy, started on Sat.

## 2018-07-10 NOTE — Progress Notes (Signed)
Subjective:    Samantha [name] - 32 y.o. female MRN [id number]  Date of birth: 02-02-1986  HPI  Samantha [name] is a 32 y.o. G61P1001 female here for vaginal bleeding. She reports a history of PCOS without spontaneous periods since age 34.  She usually has provera every few months to "make sure the lining isn't getting too thick."  She states that she had a 10-day provera challenge in July that produced a period that stopped on its own. Since that time she hadn't had any vaginal bleeding until 2 days ago, when she started to have heavy bleeding with clots. She reports saturating 2 overnight pads per hour since the start of symptoms. She denies palpitations, shortness of breath or syncope. She reports fatigue since onset. She also has severe abdominal cramps since onset.  She reports vaginal discomfort prior to the onset of symptoms that has since resolved.  She denies any vaginal itching, burning or other discomfort at this time. She was seen in the MAU prior to coming here, and reports that she had a negative urine pregnancy test there.  She is sexually active with her husband, and does not want to become pregnant at this time.  In the past, she's had trouble with birth control pills and prefers the Mirena IUD, which she had before and didn't have any adverse effects.  She would like to schedule a follow up visit to have that placed.    OB History    Gravida  1   Para  1   Term  1   Preterm      AB      Living  1     SAB      TAB      Ectopic      Multiple      Live Births  1             Health Maintenance:  Normal pap and negative HRHPV on 01/12/2016.   Health Maintenance:   Health Maintenance Due  Topic Date Due  . INFLUENZA VACCINE  05/04/2018    -  reports that she has been smoking cigarettes. She has a 0.50 pack-year smoking history. She has never used smokeless tobacco. - Review of Systems: Per HPI. - Past Medical History: Patient Active Problem List   Diagnosis Date Noted  . MS (multiple sclerosis) (HCC) 12/16/2017  . Demyelinating disease (HCC) 12/08/2017  . Tobacco abuse    - Medications: reviewed and updated   Objective:   Physical Exam BP 125/78   Pulse 69   Ht 5\' 5"  (1.651 m)   Wt 257 lb 11.2 oz (116.9 kg)   LMP 07/08/2018   BMI 42.88 kg/m  Gen: NAD, alert, cooperative with exam, well-appearing HEENT: NCAT, clear conjunctiva Resp:  non-labored Skin: no rashes, normal turgor  Neuro: no gross deficits.  Psych: alert and oriented GU/GYN: Exam performed in the presence of a chaperone. External genitalia within normal limits.  Vaginal mucosa pink, moist, normal rugae.  Nonfriable cervix without lesions, no discharge, moderate bleeding noted on speculum exam.  Bimanual exam revealed normal, nongravid uterus.  No cervical motion tenderness. No adnexal masses bilaterally.        Assessment & Plan:   1. Moderate vaginal bleeding - CBC draw today - discussed birth control options; pt would like Liletta IUD - return precautions for anemia - follow up ASAP for liletta placement or PRN for symptomatic anemia   Routine preventative health maintenance measures  emphasized. Please refer to After Visit Summary for other counseling recommendations.   Return in about 1 week (around 07/17/2018), or or if symptoms worsen or fail to improve.  Gwenevere Abbot, MD OB Fellow  07/10/2018, 6:57 PM

## 2018-07-11 ENCOUNTER — Telehealth: Payer: Self-pay | Admitting: Family Medicine

## 2018-07-11 LAB — CBC
HEMOGLOBIN: 13.6 g/dL (ref 11.1–15.9)
Hematocrit: 41.5 % (ref 34.0–46.6)
MCH: 26.8 pg (ref 26.6–33.0)
MCHC: 32.8 g/dL (ref 31.5–35.7)
MCV: 82 fL (ref 79–97)
PLATELETS: 449 10*3/uL (ref 150–450)
RBC: 5.08 x10E6/uL (ref 3.77–5.28)
RDW: 13.9 % (ref 12.3–15.4)
WBC: 7.9 10*3/uL (ref 3.4–10.8)

## 2018-07-11 LAB — POCT PREGNANCY, URINE: Preg Test, Ur: NEGATIVE

## 2018-07-11 NOTE — Telephone Encounter (Signed)
Called patient to discuss results of CBC. No answer. Left voicemail. Encouraged pt to call clinic or I will attempt to contact her again.

## 2018-07-11 NOTE — Telephone Encounter (Signed)
Patient called back to review results of CBC. Notified pt that hgb 13.6, which is normal. Asked pt how she's feeling today and she reports feeling a little better. States that she tried to make an appointment for IUD insertion, but was told that she would need to call back on Oct 21 to get an appt in November. Reviewed return precautions and encouraged to follow up if concerns arise.

## 2018-08-21 ENCOUNTER — Encounter: Payer: Self-pay | Admitting: Family Medicine

## 2018-08-21 ENCOUNTER — Ambulatory Visit (INDEPENDENT_AMBULATORY_CARE_PROVIDER_SITE_OTHER): Payer: BLUE CROSS/BLUE SHIELD | Admitting: Family Medicine

## 2018-08-21 VITALS — BP 116/70 | HR 76 | Temp 98.5°F | Ht 65.0 in | Wt 254.2 lb

## 2018-08-21 DIAGNOSIS — M545 Low back pain, unspecified: Secondary | ICD-10-CM

## 2018-08-21 DIAGNOSIS — Z23 Encounter for immunization: Secondary | ICD-10-CM | POA: Diagnosis not present

## 2018-08-21 LAB — POCT URINALYSIS DIPSTICK
Bilirubin, UA: NEGATIVE
Blood, UA: NEGATIVE
Glucose, UA: NEGATIVE
Ketones, UA: NEGATIVE
LEUKOCYTES UA: NEGATIVE
NITRITE UA: NEGATIVE
PROTEIN UA: NEGATIVE
SPEC GRAV UA: 1.025 (ref 1.010–1.025)
Urobilinogen, UA: 0.2 E.U./dL
pH, UA: 6 (ref 5.0–8.0)

## 2018-08-21 MED ORDER — CEPHALEXIN 500 MG PO CAPS: 500.0000 mg | ORAL_CAPSULE | Freq: Two times a day (BID) | ORAL | 0 refills | Status: AC

## 2018-08-21 MED ORDER — KETOROLAC TROMETHAMINE 60 MG/2ML IM SOLN
60.0000 mg | Freq: Once | INTRAMUSCULAR | Status: AC
  Administered 2018-08-21: 60 mg via INTRAMUSCULAR

## 2018-08-21 MED ORDER — DICLOFENAC SODIUM 75 MG PO TBEC: 75.0000 mg | DELAYED_RELEASE_TABLET | Freq: Two times a day (BID) | ORAL | 0 refills | Status: DC

## 2018-08-21 MED ORDER — CYCLOBENZAPRINE HCL 5 MG PO TABS: 2.5000 mg | ORAL_TABLET | Freq: Three times a day (TID) | ORAL | 0 refills | Status: DC | PRN

## 2018-08-21 NOTE — Progress Notes (Signed)
   Subjective:  Samantha [name] is a 32 y.o. female who presents today for same-day appointment with a chief complaint of back pain.   HPI:  Back Pain, Acute problem Started a few days ago.  Located left lower back.  About 3 weeks ago she was diagnosed with a UTI by her neurologist and started on 3 days of Bactrim.  At that time she was having some burning with urination.  The symptoms have since resolved.  Pain is worsened over the last couple days.  Describes a throbbing, aching sensation.  Worse with movement.  Worse with urination.  No treatments tried.  No fevers or chills.  No dysuria.  No other obvious alleviating or aggravating factors.  ROS: Per HPI  PMH: She reports that she has been smoking cigarettes. She has a 0.50 pack-year smoking history. She has never used smokeless tobacco. She reports that she does not drink alcohol or use drugs.  Objective:  Physical Exam: BP 116/70 (BP Location: Right Arm, Patient Position: Sitting, Cuff Size: Large)   Pulse 76   Temp 98.5 F (36.9 C) (Oral)   Ht 5\' 5"  (1.651 m)   Wt 254 lb 3.2 oz (115.3 kg)   SpO2 96%   BMI 42.30 kg/m   Gen: NAD, resting comfortably CV: RRR with no murmurs appreciated Pulm: NWOB, CTAB with no crackles, wheezes, or rhonchi MSK: -Back: No deformities.  No CVA tenderness.  Tender medication along left lumbar paraspinal muscles. Left lower extremities: Strength 5 out of 5 throughout.  Pain in left low back elicited with resisted left hip flexion.  Results for orders placed or performed in visit on 08/21/18 (from the past 24 hour(s))  POCT urinalysis dipstick     Status: None   Collection Time: 08/21/18  4:23 PM  Result Value Ref Range   Color, UA Yellow    Clarity, UA Clear    Glucose, UA Negative Negative   Bilirubin, UA Negative    Ketones, UA Negative    Spec Grav, UA 1.025 1.010 - 1.025   Blood, UA Negative    pH, UA 6.0 5.0 - 8.0   Protein, UA Negative Negative   Urobilinogen, UA 0.2 0.2 or 1.0  E.U./dL   Nitrite, UA Negative    Leukocytes, UA Negative Negative   Appearance     Odor      Assessment/Plan:  Low back pain No red flags.  Her UA is negative however patient states that symptoms feel similar to her prior UTIs.  Will start Keflex 500 mg twice daily culture results return.  Symptoms most likely musculoskeletal in etiology.  Will give 60 mg of Toradol today.  Also start low-dose Flexeril and and diclofenac 75 mg twice daily starting tomorrow.  Recommended heating pad to the area.  Discussed reasons to return to care.  Follow-up as needed.  Preventive health care Flu shot given today.  Katina Degree. [name], MD 08/21/2018 4:54 PM

## 2018-08-21 NOTE — Patient Instructions (Addendum)
It was very nice to see you today!  Please start the diclofenac and Flexeril as needed.  We will give you an injection of a anti-inflammatory pain medication called Toradol today.  We will also give your flu shot.  We will start you on an antibiotic until your culture results come back.  You can use a heating pad to the area as well.  Please let me know if your symptoms worsen or do not improve the next few days.  Take care, Dr [name]

## 2018-08-22 LAB — URINE CULTURE
MICRO NUMBER: 91386126
SPECIMEN QUALITY: ADEQUATE

## 2018-08-23 NOTE — Progress Notes (Signed)
Please inform patient of the following:  Her urine culture is negative. She does not have a uti. She can stop her antibiotic. Would like for her to continue with the muscle relaxer and anti-inflammatory and let us know if not improving in the next week or so.  Katina Degree. [name], MD 08/23/2018 10:37 AM

## 2018-08-28 ENCOUNTER — Other Ambulatory Visit (HOSPITAL_COMMUNITY)
Admission: RE | Admit: 2018-08-28 | Discharge: 2018-08-28 | Disposition: A | Discharge status: Home / Self Care | Payer: BLUE CROSS/BLUE SHIELD | Source: Ambulatory Visit | Attending: Family Medicine | Admitting: Family Medicine

## 2018-08-28 ENCOUNTER — Encounter: Payer: Self-pay | Admitting: Family Medicine

## 2018-08-28 ENCOUNTER — Ambulatory Visit: Payer: BLUE CROSS/BLUE SHIELD | Admitting: Family Medicine

## 2018-08-28 VITALS — BP 128/70 | HR 72 | Temp 98.3°F | Ht 65.0 in | Wt 254.2 lb

## 2018-08-28 DIAGNOSIS — Z202 Contact with and (suspected) exposure to infections with a predominantly sexual mode of transmission: Secondary | ICD-10-CM

## 2018-08-28 DIAGNOSIS — N898 Other specified noninflammatory disorders of vagina: Secondary | ICD-10-CM

## 2018-08-28 MED ORDER — FLUCONAZOLE 150 MG PO TABS: 150.0000 mg | ORAL_TABLET | Freq: Once | ORAL | 0 refills | Status: AC

## 2018-08-28 NOTE — Patient Instructions (Signed)
It was very nice to see you today!  Please take the one time dose of diflucan.  We will check you for STDs today.  Please let me know if your symptoms worsen or do not improve with the diflucan.  Take care, Dr [name]

## 2018-08-28 NOTE — Progress Notes (Signed)
   Subjective:  Samantha [name] is a 32 y.o. female who presents today with a chief complaint of STD exposure.   HPI:  Vaginal Discharge Started a few days ago. Consistent recent infections.  No treatments tried.  Was recently on a course of Keflex for UTI.  Patient is also concerned because 1 of her recent partners told her that they were positive for herpes and that she should be tested as well.  Denies any lesions, sores, or blisters.  No fever or chills.  No abdominal pain.  No nausea or vomiting.  Symptoms are stable.  No other obvious alleviating or aggravating factors.  ROS: Per HPI  PMH: She reports that she has been smoking cigarettes. She has a 0.50 pack-year smoking history. She has never used smokeless tobacco. She reports that she does not drink alcohol or use drugs.  Objective:  Physical Exam: BP 128/70 (BP Location: Left Arm, Patient Position: Sitting, Cuff Size: Large)   Pulse 72   Temp 98.3 F (36.8 C) (Oral)   Ht 5\' 5"  (1.651 m)   Wt 254 lb 3.2 oz (115.3 kg)   SpO2 97%   BMI 42.30 kg/m   Gen: NAD, resting comfortably CV: RRR with no murmurs appreciated Pulm: NWOB, CTAB with no crackles, wheezes, or rhonchi GI: Normal bowel sounds present. Soft, Nontender, Nondistended. GU: Deferred  Assessment/Plan:  Vaginal discharge/STD exposure We will empirically treat for Candida infection with one-time dose of Diflucan.  Patient declined GU exam today.  We will check HIV antibody, HSV antibodies, and RPR.  Also check urine gonorrhea, chlamydia, trichomonas, and Candida.  Discussed reasons return to care.  Follow-up as needed.  Katina Degree. [name], MD 08/28/2018 1:32 PM

## 2018-08-29 LAB — HIV ANTIBODY (ROUTINE TESTING W REFLEX): HIV 1&2 Ab, 4th Generation: NONREACTIVE

## 2018-08-29 LAB — HSV(HERPES SIMPLEX VRS) I + II AB-IGG

## 2018-08-29 LAB — RPR: RPR Ser Ql: NONREACTIVE

## 2018-08-30 LAB — URINE CYTOLOGY ANCILLARY ONLY
Chlamydia: NEGATIVE
Neisseria Gonorrhea: NEGATIVE
Trichomonas: NEGATIVE

## 2018-09-02 LAB — URINE CYTOLOGY ANCILLARY ONLY
BACTERIAL VAGINITIS: POSITIVE — AB
Candida vaginitis: NEGATIVE

## 2018-09-04 ENCOUNTER — Encounter: Payer: Self-pay | Admitting: Family Medicine

## 2018-09-06 ENCOUNTER — Telehealth: Payer: Self-pay | Admitting: Family Medicine

## 2018-09-06 ENCOUNTER — Other Ambulatory Visit: Payer: Self-pay

## 2018-09-06 MED ORDER — METRONIDAZOLE 500 MG PO TABS: 500.0000 mg | ORAL_TABLET | Freq: Two times a day (BID) | ORAL | 0 refills | Status: AC

## 2018-09-06 NOTE — Progress Notes (Signed)
Please inform patient of the following:  Her tests were positive for BV, but all of her other STD tests were negative. Please send in a course of flagyl 500mg  bid for 7 days. We do not need to retest unless she is still having symptoms.  Samantha [name]. [name], MD 09/06/2018 8:19 AM

## 2018-09-06 NOTE — Telephone Encounter (Signed)
See note

## 2018-09-06 NOTE — Telephone Encounter (Signed)
Please advise 

## 2018-09-06 NOTE — Telephone Encounter (Signed)
Copied from CRM [id number]. Topic: Quick Communication - Rx Refill/Question >> Sep 06, 2018  1:25 PM Mcneil, Ja-Kwan wrote: Medication: fluconazole (DIFLUCAN) 150 MG tablet  Has the patient contacted their pharmacy? no  Preferred Pharmacy (with phone number or street name): [facility] [phone] [place]in[place] Otto, [place] - [address] [phone] (Phone)  [phone] (Fax)  Agent: Please be advised that RX refills may take up to 3 business days. We ask that you follow-up with your pharmacy.

## 2018-09-06 NOTE — Telephone Encounter (Signed)
Attempted to contact pt regarding refill request for diflucan; this medication is not on the pt's medication list; left message for pt to call the office on voicemail [phone].

## 2018-09-07 NOTE — Telephone Encounter (Signed)
Pt did not have yeast on her test, but she did have BV.   Would like for her to finish her course of flagyl as I think this will treat the source of her discharge.  Katina Degree. [name], MD 09/07/2018 7:39 PM

## 2018-09-08 MED ORDER — FLUCONAZOLE 150 MG PO TABS: 150.0000 mg | ORAL_TABLET | Freq: Once | ORAL | 0 refills | Status: AC

## 2018-09-08 NOTE — Telephone Encounter (Signed)
Pt calling back stating that she really would like the Diflucan just incase she in up with a yeast infection please give her a call back at [phone]

## 2018-09-08 NOTE — Telephone Encounter (Signed)
Ok to send in 1 time dose of diflucan 150mg .  Wilber Oliphant M. [name], MD 09/08/2018 4:38 PM

## 2018-09-08 NOTE — Telephone Encounter (Signed)
See note

## 2018-09-08 NOTE — Telephone Encounter (Signed)
RX request,please advise

## 2018-09-08 NOTE — Telephone Encounter (Signed)
Called and informed patient. 

## 2018-10-06 ENCOUNTER — Ambulatory Visit (HOSPITAL_COMMUNITY)
Admission: EM | Admit: 2018-10-06 | Discharge: 2018-10-06 | Disposition: A | Discharge status: Home / Self Care | Payer: BLUE CROSS/BLUE SHIELD | Source: Home / Self Care | Attending: Family Medicine | Admitting: Family Medicine

## 2018-10-06 ENCOUNTER — Other Ambulatory Visit: Payer: Self-pay

## 2018-10-06 ENCOUNTER — Encounter (HOSPITAL_COMMUNITY): Payer: Self-pay

## 2018-10-06 DIAGNOSIS — F17210 Nicotine dependence, cigarettes, uncomplicated: Secondary | ICD-10-CM | POA: Insufficient documentation

## 2018-10-06 DIAGNOSIS — E876 Hypokalemia: Secondary | ICD-10-CM | POA: Diagnosis not present

## 2018-10-06 DIAGNOSIS — J111 Influenza due to unidentified influenza virus with other respiratory manifestations: Secondary | ICD-10-CM | POA: Diagnosis not present

## 2018-10-06 DIAGNOSIS — Z79899 Other long term (current) drug therapy: Secondary | ICD-10-CM | POA: Diagnosis not present

## 2018-10-06 DIAGNOSIS — G35 Multiple sclerosis: Secondary | ICD-10-CM | POA: Insufficient documentation

## 2018-10-06 DIAGNOSIS — R509 Fever, unspecified: Secondary | ICD-10-CM | POA: Diagnosis present

## 2018-10-06 DIAGNOSIS — R531 Weakness: Secondary | ICD-10-CM | POA: Insufficient documentation

## 2018-10-06 NOTE — ED Triage Notes (Signed)
Pt cc states she has a the flu and needs meds for her MS. Pt has not had meds for her MS since last October.

## 2018-10-06 NOTE — ED Triage Notes (Signed)
Patient c/o a non productive cough, fever, and generalized body aches x 2 days. Patient states she has a MS flare up and states she has not had an infusion since October. Patient states she has had an antibody reaction to her last medicine and has not started her new MS medicine yet.

## 2018-10-06 NOTE — Discharge Instructions (Signed)
Given worries for possible MS flare, please go to the emergency department for further evaluation needed.

## 2018-10-06 NOTE — ED Provider Notes (Signed)
[facility]    CSN: [id number] Arrival date & time: 10/06/18  1640     History   Chief Complaint Chief Complaint  Patient presents with  . Cough    HPI Samantha [name] is a 33 y.o. female.   33 year old female comes in for flu like symptoms. However, when obtaining HPI, patient states she is worried about MS flare. She has been off her immunotherapy, Tysabri, since 07/2018, she saw her neurologist on 09/21/2018 who is getting preapproval for Ocrevus. States for the past "few months" she has had symptoms that made her feel like her MS is flaring up, and these symptoms worsened the past couple of days. She has had tremors to hand and feet, extremity weakness. She has also noticed occasional right arm numbness. She has trouble thinking, and cannot find her words. Talks slower due to this. Denies dysphasia, chest pain, shortness of breath, trouble breathing. States has had more frequent falls due to these symptoms.      Past Medical History:  Diagnosis Date  . Arthritis   . Chicken pox   . Chlamydia 12/02/2011  . Depression   . Fracture of left leg 2009   removal metal plate in left leg 2016  . Frequent headaches   . Infection    UTI  . Kidney stone    Kidney stone  . Migraines   . Multiple sclerosis (HCC)    receives care @ [facility]  . Neuropathy   . PCOS (polycystic ovarian syndrome)   . Tobacco abuse     Patient Active Problem List   Diagnosis Date Noted  . MS (multiple sclerosis) (HCC) 12/16/2017  . Demyelinating disease (HCC) 12/08/2017  . Tobacco abuse     Past Surgical History:  Procedure Laterality Date  . CESAREAN SECTION N/A 05/12/2013   Procedure: Primary cesarean section with delivery of baby boy at 0754.;  Surgeon: Freddrick March. Tenny Craw, MD;  Location: [facility];  Service: Obstetrics;  Laterality: N/A;  . REMOVAL OF IMPLANT Left 09/25/2015   Procedure: LEFT ANKLE REMOVAL OF DEEP  IMPLANT DEBRIDEMENT OF ANTERIOR MEDIAL ANKLE JOINT;  Surgeon: Toni Arthurs, MD;  Location: [facility];  Service: Orthopedics;  Laterality: Left;  . TIBIA FRACTURE SURGERY      OB History    Gravida  1   Para  1   Term  1   Preterm      AB      Living  1     SAB      TAB      Ectopic      Multiple      Live Births  1            Home Medications    Prior to Admission medications   Medication Sig Start Date End Date Taking? Authorizing Provider  cholecalciferol (VITAMIN D) 1000 units tablet Take 2,000 Units by mouth daily.     [provider]  cyclobenzaprine (FLEXERIL) 5 MG tablet Take 0.5 tablets (2.5 mg total) by mouth 3 (three) times daily as needed for muscle spasms. 08/21/18   Ardith Dark, MD  diclofenac (VOLTAREN) 75 MG EC tablet Take 1 tablet (75 mg total) by mouth 2 (two) times daily. 08/21/18   Ardith Dark, MD  DULoxetine (CYMBALTA) 30 MG capsule  08/08/18   [provider]  TYSABRI 300 MG/15ML injection  06/14/18   [provider]    Family History Family History  Problem Relation Age of Onset  . Hypertension Other   . Diabetes Other   . Ulcers Mother   . GER disease Mother   . Asthma Mother   . Heart attack Mother   . Lupus Mother   . Cancer Father        kidney  . Drug abuse Father   . GER disease Sister   . Asthma Brother   . Birth defects Maternal Grandfather   . Heart attack Maternal Grandfather   . High blood pressure Maternal Grandfather   . Stroke Maternal Grandfather   . Autism Son   . Learning disabilities Son     Social History Social History   Tobacco Use  . Smoking status: Current Every Day Smoker    Packs/day: 0.25    Years: 2.00    Pack years: 0.50    Types: Cigarettes  . Smokeless tobacco: Never Used  . Tobacco comment: quit with preg  Substance Use Topics  . Alcohol use: No    Comment: socially none since pregnancy  . Drug use: No     Allergies   Baclofen and Tizanidine   Review of Systems Review of Systems  Reason  unable to perform ROS: See HPI as above.     Physical Exam Triage Vital Signs ED Triage Vitals  Enc Vitals Group     BP 10/06/18 1720 (!) 146/94     Pulse Rate 10/06/18 1720 96     Resp 10/06/18 1720 18     Temp 10/06/18 1720 99.3 F (37.4 C)     Temp Source 10/06/18 1720 Oral     SpO2 10/06/18 1720 100 %     Weight 10/06/18 1717 243 lb (110.2 kg)     Height --      Head Circumference --      Peak Flow --      Pain Score 10/06/18 1717 2     Pain Loc --      Pain Edu? --      Excl. in GC? --    No data found.  Updated Vital Signs BP (!) 146/94 (BP Location: Right Arm)   Pulse 96   Temp 99.3 F (37.4 C) (Oral)   Resp 18   Wt 243 lb (110.2 kg)   LMP 08/06/2018   SpO2 100%   BMI 40.44 kg/m   Physical Exam Constitutional:      General: She is not in acute distress.    Appearance: She is well-developed. She is not diaphoretic.  HENT:     Head: Normocephalic and atraumatic.     Mouth/Throat:     Mouth: Mucous membranes are moist.     Pharynx: Oropharynx is clear.  Eyes:     Extraocular Movements: Extraocular movements intact.     Conjunctiva/sclera: Conjunctivae normal.     Pupils: Pupils are equal, round, and reactive to light.  Neck:     Musculoskeletal: Normal range of motion and neck supple.  Cardiovascular:     Rate and Rhythm: Normal rate and regular rhythm.     Heart sounds: No murmur. No friction rub. No gallop.   Pulmonary:     Effort: Pulmonary effort is normal. No respiratory distress.     Breath sounds: Normal breath sounds. No stridor. No wheezing, rhonchi or rales.  Neurological:     Mental Status: She is alert and oriented to person, place, and time.     GCS: GCS eye subscore is 4. GCS verbal subscore is  5. GCS motor subscore is 6.     Comments: Patient talking slowly, in short phrases. Long pauses between sentences. Handling own secretions. No obvious focal deficit, but strength 3/5 bilaterally for cranial nerve 11, 12, grip strength. Walking  with a limp.       UC Treatments / Results  Labs (all labs ordered are listed, but only abnormal results are displayed) Labs Reviewed - No data to display  EKG None  Radiology No results found.  Procedures Procedures (including critical care time)  Medications Ordered in UC Medications - No data to display  Initial Impression / Assessment and Plan / UC Course  I have reviewed the triage vital signs and the nursing notes.  Pertinent labs & imaging results that were available during my care of the patient were reviewed by me and considered in my medical decision making (see chart for details).    Given exam and complaint, discussed case with Dr Tracie Harrier, who suggested further evaluation at the emergency department. Discharged patient in stable condition to the ED for further evaluation needed.  Final Clinical Impressions(s) / UC Diagnoses   Final diagnoses:  Weakness    ED Prescriptions    None        Belinda Fisher, PA-C 10/06/18 1756

## 2018-10-06 NOTE — ED Notes (Signed)
Patient able to ambulate independently  

## 2018-10-07 ENCOUNTER — Emergency Department (HOSPITAL_COMMUNITY): Payer: BLUE CROSS/BLUE SHIELD

## 2018-10-07 ENCOUNTER — Emergency Department (HOSPITAL_COMMUNITY)
Admission: EM | Admit: 2018-10-07 | Discharge: 2018-10-07 | Disposition: A | Discharge status: Home / Self Care | Payer: BLUE CROSS/BLUE SHIELD | Attending: Emergency Medicine | Admitting: Emergency Medicine

## 2018-10-07 DIAGNOSIS — J111 Influenza due to unidentified influenza virus with other respiratory manifestations: Secondary | ICD-10-CM

## 2018-10-07 DIAGNOSIS — R69 Illness, unspecified: Secondary | ICD-10-CM

## 2018-10-07 LAB — BASIC METABOLIC PANEL
ANION GAP: 12 (ref 5–15)
BUN: 8 mg/dL (ref 6–20)
CALCIUM: 8.8 mg/dL — AB (ref 8.9–10.3)
CHLORIDE: 105 mmol/L (ref 98–111)
CO2: 23 mmol/L (ref 22–32)
CREATININE: 0.77 mg/dL (ref 0.44–1.00)
GLUCOSE: 93 mg/dL (ref 70–99)
POTASSIUM: 3.1 mmol/L — AB (ref 3.5–5.1)
Sodium: 140 mmol/L (ref 135–145)

## 2018-10-07 LAB — INFLUENZA PANEL BY PCR (TYPE A & B)
Influenza A By PCR: NEGATIVE
Influenza B By PCR: NEGATIVE

## 2018-10-07 LAB — CBC WITH DIFFERENTIAL/PLATELET
Abs Immature Granulocytes: 0.02 10*3/uL (ref 0.00–0.07)
BASOS ABS: 0.1 10*3/uL (ref 0.0–0.1)
Basophils Relative: 1 %
EOS ABS: 0.2 10*3/uL (ref 0.0–0.5)
EOS PCT: 2 %
HCT: 43.6 % (ref 36.0–46.0)
Hemoglobin: 13.6 g/dL (ref 12.0–15.0)
IMMATURE GRANULOCYTES: 0 %
Lymphocytes Relative: 23 %
Lymphs Abs: 2.1 10*3/uL (ref 0.7–4.0)
MCH: 27 pg (ref 26.0–34.0)
MCHC: 31.2 g/dL (ref 30.0–36.0)
MCV: 86.7 fL (ref 80.0–100.0)
MONO ABS: 0.6 10*3/uL (ref 0.1–1.0)
MONOS PCT: 7 %
NEUTROS ABS: 6 10*3/uL (ref 1.7–7.7)
Neutrophils Relative %: 67 %
PLATELETS: 415 10*3/uL — AB (ref 150–400)
RBC: 5.03 MIL/uL (ref 3.87–5.11)
RDW: 14.8 % (ref 11.5–15.5)
WBC: 8.9 10*3/uL (ref 4.0–10.5)
nRBC: 0 % (ref 0.0–0.2)

## 2018-10-07 LAB — I-STAT BETA HCG BLOOD, ED (MC, WL, AP ONLY)

## 2018-10-07 MED ORDER — FLUTICASONE PROPIONATE 50 MCG/ACT NA SUSP: 1.0000 | Freq: Every day | NASAL | 0 refills | Status: DC

## 2018-10-07 MED ORDER — BENZONATATE 100 MG PO CAPS: 100.0000 mg | ORAL_CAPSULE | Freq: Three times a day (TID) | ORAL | 0 refills | Status: DC

## 2018-10-07 MED ORDER — GADOBUTROL 1 MMOL/ML IV SOLN
10.0000 mL | Freq: Once | INTRAVENOUS | Status: AC | PRN
  Administered 2018-10-07: 10 mL via INTRAVENOUS

## 2018-10-07 MED ORDER — POTASSIUM CHLORIDE CRYS ER 20 MEQ PO TBCR: 20.0000 meq | EXTENDED_RELEASE_TABLET | Freq: Every day | ORAL | 0 refills | Status: DC

## 2018-10-07 MED ORDER — LORAZEPAM 2 MG/ML IJ SOLN: 1.0000 mg | Freq: Once | INTRAMUSCULAR | Status: DC

## 2018-10-07 MED ORDER — ACETAMINOPHEN 500 MG PO TABS
1000.0000 mg | ORAL_TABLET | Freq: Once | ORAL | Status: AC
  Administered 2018-10-07: 1000 mg via ORAL
  Filled 2018-10-07: qty 2

## 2018-10-07 NOTE — ED Provider Notes (Signed)
07:00: Assumed care from Arthor Captain PA-C at change of shift pending MRI results. Plan for discharge home with supportive measures for flu like illness if MRI without concerning findings, if abnormal will likely require neurology consultation.   Please see prior provider note for full H&P "Samantha [name] is a 33 y.o. female patient sent to the emergency department from [facility] urgent care.  She has a past medical history of MS and is followed at [facility].  Patient has had 1 week of worsening flulike symptoms and body aches.  She is not been on her previous MS medications for the past 3 months secondary to infections and is currently awaiting preapproval for a new medication.  Patient states that she feels like she is having an MS flare because she is having tremors, foggy thinking, confusion, and upper extremity weakness including dropping things.  She is also having some word finding difficulties.  Patient denies any other symptoms."   Physical Exam  BP 126/72 (BP Location: Left Arm)   Pulse 87   Temp 98.8 F (37.1 C) (Oral)   Resp 16   Ht 5\' 5"  (1.651 m)   Wt 110.2 kg   LMP 08/06/2018 (Approximate) Comment: patient states she has irregular periods  SpO2 100%   BMI 40.44 kg/m   Physical Exam Vitals signs and nursing note reviewed.  Constitutional:      General: She is not in acute distress.    Appearance: She is well-developed.  HENT:     Head: Normocephalic and atraumatic.  Eyes:     General:        Right eye: No discharge.        Left eye: No discharge.     Conjunctiva/sclera: Conjunctivae normal.  Neurological:     Mental Status: She is alert.     Comments: Clear speech.   Psychiatric:        Behavior: Behavior normal.        Thought Content: Thought content normal.     ED Course/Procedures     Procedures Results for orders placed or performed during the hospital encounter of 10/07/18  Basic metabolic panel  Result Value Ref Range   Sodium  140 135 - 145 mmol/L   Potassium 3.1 (L) 3.5 - 5.1 mmol/L   Chloride 105 98 - 111 mmol/L   CO2 23 22 - 32 mmol/L   Glucose, Bld 93 70 - 99 mg/dL   BUN 8 6 - 20 mg/dL   Creatinine, Ser 4.09 0.44 - 1.00 mg/dL   Calcium 8.8 (L) 8.9 - 10.3 mg/dL   GFR calc non Af Amer >60 >60 mL/min   GFR calc Af Amer >60 >60 mL/min   Anion gap 12 5 - 15  CBC with Differential  Result Value Ref Range   WBC 8.9 4.0 - 10.5 K/uL   RBC 5.03 3.87 - 5.11 MIL/uL   Hemoglobin 13.6 12.0 - 15.0 g/dL   HCT 73.5 32.9 - 92.4 %   MCV 86.7 80.0 - 100.0 fL   MCH 27.0 26.0 - 34.0 pg   MCHC 31.2 30.0 - 36.0 g/dL   RDW 26.8 34.1 - 96.2 %   Platelets 415 (H) 150 - 400 K/uL   nRBC 0.0 0.0 - 0.2 %   Neutrophils Relative % 67 %   Neutro Abs 6.0 1.7 - 7.7 K/uL   Lymphocytes Relative 23 %   Lymphs Abs 2.1 0.7 - 4.0 K/uL   Monocytes Relative 7 %  Monocytes Absolute 0.6 0.1 - 1.0 K/uL   Eosinophils Relative 2 %   Eosinophils Absolute 0.2 0.0 - 0.5 K/uL   Basophils Relative 1 %   Basophils Absolute 0.1 0.0 - 0.1 K/uL   Immature Granulocytes 0 %   Abs Immature Granulocytes 0.02 0.00 - 0.07 K/uL  Influenza panel by PCR (type A & B)  Result Value Ref Range   Influenza A By PCR NEGATIVE NEGATIVE   Influenza B By PCR NEGATIVE NEGATIVE  I-Stat beta hCG blood, ED  Result Value Ref Range   I-stat hCG, quantitative <5.0 <5 mIU/mL   Comment 3           Dg Chest 2 View  Result Date: 10/07/2018 CLINICAL DATA:  Acute onset of nonproductive cough, fever and generalized body aches. EXAM: CHEST - 2 VIEW COMPARISON:  Chest radiograph performed 12/09/2017 FINDINGS: The lungs are well-aerated. A 1.2 cm nodular density is noted at the right midlung zone. There is no evidence of pleural effusion or pneumothorax. The heart is normal in size; the mediastinal contour is within normal limits. No acute osseous abnormalities are seen. IMPRESSION: 1.2 cm nodular density at the right midlung zone. Though this most likely reflects a bone island,  new from the prior study, CT of the chest would be helpful for further evaluation, when and as deemed clinically appropriate, to exclude a pulmonary nodule. Electronically Signed   By: Roanna RaiderJeffery  Chang M.D.   On: 10/07/2018 02:29   Mr Laqueta JeanBrain W And Wo Contrast  Result Date: 10/07/2018 CLINICAL DATA:  History of multiple sclerosis. Tingling in the arms and hands. EXAM: MRI HEAD WITHOUT AND WITH CONTRAST TECHNIQUE: Multiplanar, multiecho pulse sequences of the brain and surrounding structures were obtained without and with intravenous contrast. CONTRAST:  10 cc Gadavist COMPARISON:  05/10/2018 FINDINGS: Brain: Diffusion imaging does not show any restricted diffusion lesions. Brainstem is normal. Few small cerebellar demyelinating foci. Cerebral hemispheres show scattered MS lesions the hemispheric white matter left more than right. These continue to become less distinct and apparent. None show contrast enhancement. No new lesions. No evidence of mass, hemorrhage, hydrocephalus or extra-axial collection. Vascular: Major vessels at the base of the brain show flow. Skull and upper cervical spine: Negative Sinuses/Orbits: Left ethmoid and maxillary sinus inflammation. Other: None IMPRESSION: No new or progressive brain lesions. Continued involution of foci of demyelinating disease affecting the cerebellum and cerebral hemispheres. No restricted diffusion or contrast enhancing lesions. Left ethmoid and maxillary sinus inflammatory changes. Electronically Signed   By: Paulina FusiMark  Shogry M.D.   On: 10/07/2018 09:35   Mr Cervical Spine W Or Wo Contrast  Result Date: 10/07/2018 CLINICAL DATA:  History of multiple sclerosis. Numbness and tingling the hands and arms over the last 2 months. EXAM: MRI CERVICAL SPINE WITHOUT AND WITH CONTRAST TECHNIQUE: Multiplanar and multiecho pulse sequences of the cervical spine, to include the craniocervical junction and cervicothoracic junction, were obtained without and with intravenous  contrast. CONTRAST:  10 cc Gadavist COMPARISON:  12/08/2017 FINDINGS: Alignment: Normal Vertebrae: Normal Cord: Multiple sclerosis cord lesions as seen previously, most prominent at C1/C2, C3, C4-5, C7 and T3. There is less enhancement of the lesions, with only mild smudgy enhancement remaining at the C2-3 lesion. No new or progressive findings. Posterior Fossa, vertebral arteries, paraspinal tissues: See results of brain MRI Disc levels: No degenerative disc disease. No bulge or herniation. No canal or foraminal stenosis. 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 No new  or progressive lesions compared to the study March 2019. There is less contrast enhancement noted today, with only mild smudgy enhancement remaining at the C3 lesion. Electronically Signed   By: Paulina Fusi M.D.   On: 10/07/2018 09:40    MDM   Patient's ER work-up reviewed by me personally. Labs without significant abnormality.  Patient is mildly hypokalemic at 3.1 and mildly hypocalcemic at 8.8.  Platelets are elevated consistent with prior on record.  No anemia or leukocytosis.  Patient is not pregnant.  Influenza swabs are negative.  Her chest x-ray does not show pneumonia, there is 1.2 cm nodular density at the right midlung zone-will likely require outpatient follow-up CT for further characterization, does not seem emergent.  MRI without new or progressive lesions in the brain or cervical spine.   On my evaluation patient is resting comfortably and feels ready to go home.  Will discharge with supportive care including Flonase, Tessalon, as well as some potassium supplementation.  PCP follow up. I discussed results, treatment plan, need for follow-up, and return precautions with the patient. Provided opportunity for questions, patient confirmed understanding and is in agreement with plan.        Desmond Lope 10/07/18 1025    Raeford Razor,  MD 10/07/18 2340

## 2018-10-07 NOTE — ED Notes (Signed)
Pt made aware she needed to change into gown.

## 2018-10-07 NOTE — ED Notes (Signed)
She is awake, alert and watching T.V. she tells me she has "been here a  Long time and I'm hungry". I assure her our provider will be in shortly to give here test results/plan.

## 2018-10-07 NOTE — Discharge Instructions (Addendum)
You are seen in the ER today and thought to have a flulike illness.  Your labs show that your potassium and calcium are each a bit low, please have this rechecked by primary care provider, we have given you potassium supplements to take as well as diet recommendations.  The MRIs of your brain and neck did not show any new or progressive lesions regarding your MS.  Chest x-ray did not show pneumonia, however it did show a small density, please discuss with your primary care as you may benefit from a CT scan to further evaluate this.  Your flu swabs were negative.  We are sending you home with Flonase for congestion, Tessalon for cough, and potassium for low potassium.  Please take medications as prescribed.  We have prescribed you new medication(s) today. Discuss the medications prescribed today with your pharmacist as they can have adverse effects and interactions with your other medicines including over the counter and prescribed medications. Seek medical evaluation if you start to experience new or abnormal symptoms after taking one of these medicines, seek care immediately if you start to experience difficulty breathing, feeling of your throat closing, facial swelling, or rash as these could be indications of a more serious allergic reaction  These follow-up with primary care within 3 days.  Return to the ER for new or worsening symptoms or any other concerns.

## 2018-10-07 NOTE — ED Notes (Signed)
As I write this, she is in MRI. She was taken there at ~0730 hours.

## 2018-10-07 NOTE — ED Provider Notes (Signed)
[facility]-EMERGENCY DEPT Provider Note   CSN: [id number] Arrival date & time: 10/06/18  1839     History   Chief Complaint Chief Complaint  Patient presents with  . Fever  . Cough  . Generalized Body Aches  . MS flare up    HPI Samantha [name] is a 33 y.o. female patient sent to the emergency department from [facility] urgent care.  She has a past medical history of MS and is followed at [facility].  Patient has had 1 week of worsening flulike symptoms and body aches.  She is not been on her previous MS medications for the past 3 months secondary to infections and is currently awaiting preapproval for a new medication.  Patient states that she feels like she is having an MS flare because she is having tremors, foggy thinking, confusion, and upper extremity weakness including dropping things.  She is also having some word finding difficulties.  Patient denies any other symptoms.  HPI  Past Medical History:  Diagnosis Date  . Arthritis   . Chicken pox   . Chlamydia 12/02/2011  . Depression   . Fracture of left leg 2009   removal metal plate in left leg 2016  . Frequent headaches   . Infection    UTI  . Kidney stone    Kidney stone  . Migraines   . Multiple sclerosis (HCC)    receives care @ [facility]  . Neuropathy   . PCOS (polycystic ovarian syndrome)   . Tobacco abuse     Patient Active Problem List   Diagnosis Date Noted  . MS (multiple sclerosis) (HCC) 12/16/2017  . Demyelinating disease (HCC) 12/08/2017  . Tobacco abuse     Past Surgical History:  Procedure Laterality Date  . CESAREAN SECTION N/A 05/12/2013   Procedure: Primary cesarean section with delivery of baby boy at 0754.;  Surgeon: Freddrick March. Tenny Craw, MD;  Location: [facility];  Service: Obstetrics;  Laterality: N/A;  . REMOVAL OF IMPLANT Left 09/25/2015   Procedure: LEFT ANKLE REMOVAL OF DEEP  IMPLANT DEBRIDEMENT OF ANTERIOR MEDIAL ANKLE JOINT;  Surgeon: Toni Arthurs, MD;   Location: [facility];  Service: Orthopedics;  Laterality: Left;  . TIBIA FRACTURE SURGERY       OB History    Gravida  1   Para  1   Term  1   Preterm      AB      Living  1     SAB      TAB      Ectopic      Multiple      Live Births  1            Home Medications    Prior to Admission medications   Medication Sig Start Date End Date Taking? Authorizing Provider  cholecalciferol (VITAMIN D) 1000 units tablet Take 2,000 Units by mouth daily.    Yes [provider]  cyclobenzaprine (FLEXERIL) 5 MG tablet Take 0.5 tablets (2.5 mg total) by mouth 3 (three) times daily as needed for muscle spasms. 08/21/18  Yes Ardith Dark, MD  DULoxetine (CYMBALTA) 30 MG capsule  08/08/18  Yes [provider]  diclofenac (VOLTAREN) 75 MG EC tablet Take 1 tablet (75 mg total) by mouth 2 (two) times daily. Patient not taking: Reported on 10/07/2018 08/21/18   Ardith Dark, MD    Family History Family History  Problem Relation Age of Onset  .  Hypertension Other   . Diabetes Other   . Ulcers Mother   . GER disease Mother   . Asthma Mother   . Heart attack Mother   . Lupus Mother   . Cancer Father        kidney  . Drug abuse Father   . GER disease Sister   . Asthma Brother   . Birth defects Maternal Grandfather   . Heart attack Maternal Grandfather   . High blood pressure Maternal Grandfather   . Stroke Maternal Grandfather   . Autism Son   . Learning disabilities Son     Social History Social History   Tobacco Use  . Smoking status: Current Every Day Smoker    Packs/day: 0.25    Years: 2.00    Pack years: 0.50    Types: Cigarettes  . Smokeless tobacco: Never Used  . Tobacco comment: quit with preg  Substance Use Topics  . Alcohol use: Not Currently  . Drug use: Yes    Types: Marijuana    Comment: daily use     Allergies   Baclofen and Tizanidine   Review of Systems Review of Systems Ten systems reviewed and are  negative for acute change, except as noted in the HPI.   Physical Exam Updated Vital Signs BP 126/72 (BP Location: Left Arm)   Pulse 87   Temp 98.8 F (37.1 C) (Oral)   Resp 16   Ht 5\' 5"  (1.651 m)   Wt 110.2 kg   LMP 08/06/2018 (Approximate) Comment: patient states she has irregular periods  SpO2 100%   BMI 40.44 kg/m   Physical Exam Vitals signs and nursing note reviewed.  Constitutional:      General: She is not in acute distress.    Appearance: She is well-developed. She is not diaphoretic.  HENT:     Head: Normocephalic and atraumatic.  Eyes:     General: No scleral icterus.    Conjunctiva/sclera: Conjunctivae normal.  Neck:     Musculoskeletal: Normal range of motion.  Cardiovascular:     Rate and Rhythm: Normal rate and regular rhythm.     Heart sounds: Normal heart sounds. No murmur. No friction rub. No gallop.   Pulmonary:     Effort: Pulmonary effort is normal. No respiratory distress.     Breath sounds: Normal breath sounds.  Abdominal:     General: Bowel sounds are normal. There is no distension.     Palpations: Abdomen is soft. There is no mass.     Tenderness: There is no abdominal tenderness. There is no guarding.  Skin:    General: Skin is warm and dry.  Neurological:     Mental Status: She is alert and oriented to person, place, and time.     Comments: Speech is clear and goal oriented, follows commands Major Cranial nerves without deficit, no facial droop Normal strength in upper and lower extremities bilaterally including dorsiflexion and plantar flexion, strong and equal grip strength Sensation normal to light and sharp touch Moves extremities without ataxia, coordination intact Normal finger to nose and rapid alternating movements Neg romberg, no pronator drift Normal gait Normal heel-shin and balance   Psychiatric:        Behavior: Behavior normal.      ED Treatments / Results  Labs (all labs ordered are listed, but only abnormal  results are displayed) Labs Reviewed  BASIC METABOLIC PANEL - Abnormal; Notable for the following components:      Result Value  Potassium 3.1 (*)    Calcium 8.8 (*)    All other components within normal limits  CBC WITH DIFFERENTIAL/PLATELET - Abnormal; Notable for the following components:   Platelets 415 (*)    All other components within normal limits  INFLUENZA PANEL BY PCR (TYPE A & B)  I-STAT BETA HCG BLOOD, ED ([facility], [facility], [facility] ONLY)    EKG None  Radiology Dg Chest 2 View  Result Date: 10/07/2018 CLINICAL DATA:  Acute onset of nonproductive cough, fever and generalized body aches. EXAM: CHEST - 2 VIEW COMPARISON:  Chest radiograph performed 12/09/2017 FINDINGS: The lungs are well-aerated. A 1.2 cm nodular density is noted at the right midlung zone. There is no evidence of pleural effusion or pneumothorax. The heart is normal in size; the mediastinal contour is within normal limits. No acute osseous abnormalities are seen. IMPRESSION: 1.2 cm nodular density at the right midlung zone. Though this most likely reflects a bone island, new from the prior study, CT of the chest would be helpful for further evaluation, when and as deemed clinically appropriate, to exclude a pulmonary nodule. Electronically Signed   By: Roanna RaiderJeffery  Chang M.D.   On: 10/07/2018 02:29    Procedures Procedures (including critical care time)  Medications Ordered in ED Medications  LORazepam (ATIVAN) injection 1 mg (0 mg Intravenous Hold 10/07/18 0701)  acetaminophen (TYLENOL) tablet 1,000 mg (1,000 mg Oral Given 10/07/18 0344)     Initial Impression / Assessment and Plan / ED Course  I have reviewed the triage vital signs and the nursing notes.  Pertinent labs & imaging results that were available during my care of the patient were reviewed by me and considered in my medical decision making (see chart for details).     Patient labs without significant abnormality.  She does have some mild hypokalemia.  Influenza  is negative for type a and B.  She is awaiting a CT scan of her head and C-spine.  I [facility] will assume care  Final Clinical Impressions(s) / ED Diagnoses   Final diagnoses:  None    ED Discharge Orders    None       Arthor CaptainHarris, Senai Kingsley, PA-C 10/07/18 0710    Raeford RazorKohut, Stephen, MD 10/07/18 2339

## 2018-10-25 ENCOUNTER — Encounter (HOSPITAL_COMMUNITY): Payer: Self-pay | Admitting: *Deleted

## 2018-10-25 ENCOUNTER — Emergency Department (HOSPITAL_COMMUNITY): Payer: BLUE CROSS/BLUE SHIELD

## 2018-10-25 ENCOUNTER — Other Ambulatory Visit: Payer: Self-pay

## 2018-10-25 ENCOUNTER — Emergency Department (HOSPITAL_COMMUNITY)
Admission: EM | Admit: 2018-10-25 | Discharge: 2018-10-25 | Disposition: A | Discharge status: Home / Self Care | Payer: BLUE CROSS/BLUE SHIELD | Attending: Emergency Medicine | Admitting: Emergency Medicine

## 2018-10-25 DIAGNOSIS — F17210 Nicotine dependence, cigarettes, uncomplicated: Secondary | ICD-10-CM | POA: Diagnosis not present

## 2018-10-25 DIAGNOSIS — Z79899 Other long term (current) drug therapy: Secondary | ICD-10-CM | POA: Diagnosis not present

## 2018-10-25 DIAGNOSIS — R0789 Other chest pain: Secondary | ICD-10-CM | POA: Insufficient documentation

## 2018-10-25 LAB — CBC
HEMATOCRIT: 42.9 % (ref 36.0–46.0)
Hemoglobin: 13.6 g/dL (ref 12.0–15.0)
MCH: 27.5 pg (ref 26.0–34.0)
MCHC: 31.7 g/dL (ref 30.0–36.0)
MCV: 86.7 fL (ref 80.0–100.0)
Platelets: 429 10*3/uL — ABNORMAL HIGH (ref 150–400)
RBC: 4.95 MIL/uL (ref 3.87–5.11)
RDW: 14.8 % (ref 11.5–15.5)
WBC: 10.4 10*3/uL (ref 4.0–10.5)
nRBC: 0 % (ref 0.0–0.2)

## 2018-10-25 LAB — BASIC METABOLIC PANEL
Anion gap: 11 (ref 5–15)
BUN: 6 mg/dL (ref 6–20)
CHLORIDE: 109 mmol/L (ref 98–111)
CO2: 20 mmol/L — ABNORMAL LOW (ref 22–32)
Calcium: 9.1 mg/dL (ref 8.9–10.3)
Creatinine, Ser: 0.87 mg/dL (ref 0.44–1.00)
GFR calc Af Amer: >60 mL/min (ref >60)
GFR calc non Af Amer: >60 mL/min (ref >60)
Glucose, Bld: 191 mg/dL — ABNORMAL HIGH (ref 70–99)
Potassium: 4 mmol/L (ref 3.5–5.1)
Sodium: 140 mmol/L (ref 135–145)

## 2018-10-25 LAB — I-STAT TROPONIN, ED: Troponin i, poc: 0 ng/mL (ref 0.00–0.08)

## 2018-10-25 LAB — D-DIMER, QUANTITATIVE: D-Dimer, Quant: 0.92 ug/mL-FEU — ABNORMAL HIGH (ref 0.00–0.50)

## 2018-10-25 MED ORDER — HYDROCODONE-ACETAMINOPHEN 5-325 MG PO TABS: 1.0000 | ORAL_TABLET | Freq: Four times a day (QID) | ORAL | 0 refills | Status: DC | PRN

## 2018-10-25 MED ORDER — IOPAMIDOL (ISOVUE-370) INJECTION 76%
INTRAVENOUS | Status: AC
  Filled 2018-10-25: qty 100

## 2018-10-25 MED ORDER — KETOROLAC TROMETHAMINE 30 MG/ML IJ SOLN
30.0000 mg | Freq: Once | INTRAMUSCULAR | Status: AC
  Administered 2018-10-25: 30 mg via INTRAVENOUS
  Filled 2018-10-25: qty 1

## 2018-10-25 MED ORDER — MORPHINE SULFATE (PF) 4 MG/ML IV SOLN
4.0000 mg | Freq: Once | INTRAVENOUS | Status: AC
  Administered 2018-10-25: 4 mg via INTRAVENOUS
  Filled 2018-10-25: qty 1

## 2018-10-25 MED ORDER — IOPAMIDOL (ISOVUE-370) INJECTION 76%
100.0000 mL | Freq: Once | INTRAVENOUS | Status: AC | PRN
  Administered 2018-10-25: 100 mL via INTRAVENOUS

## 2018-10-25 MED ORDER — ONDANSETRON HCL 4 MG/2ML IJ SOLN
4.0000 mg | Freq: Once | INTRAMUSCULAR | Status: AC
  Administered 2018-10-25: 4 mg via INTRAVENOUS
  Filled 2018-10-25: qty 2

## 2018-10-25 MED ORDER — SODIUM CHLORIDE 0.9 % IV BOLUS
1000.0000 mL | Freq: Once | INTRAVENOUS | Status: AC
  Administered 2018-10-25: 1000 mL via INTRAVENOUS

## 2018-10-25 NOTE — ED Provider Notes (Signed)
[facility]   CSN: [id number] Arrival date & time: 10/25/18  2105     History   Chief Complaint Chief Complaint  Patient presents with  . Chest Pain    HPI Samantha [name] I Samantha [name] is a 33 y.o. female.  Pt presents to the ED today [facility].  This was the first time she's ever had this medication.  The pt developed itching and throat swelling during the infusion and was given benadryl and solumedrol.  The pt finished the infusion and was feeling ok initially.  When she arrived home, she developed some CP which is worse with movement.  She denies any additional itching or throat swelling.     Past Medical History:  Diagnosis Date  . Arthritis   . Chicken pox   . Chlamydia 12/02/2011  . Depression   . Fracture of left leg 2009   removal metal plate in left leg 2016  . Frequent headaches   . Infection    UTI  . Kidney stone    Kidney stone  . Migraines   . Multiple sclerosis (HCC)    receives care @ [facility]  . Neuropathy   . PCOS (polycystic ovarian syndrome)   . Tobacco abuse     Patient Active Problem List   Diagnosis Date Noted  . MS (multiple sclerosis) (HCC) 12/16/2017  . Demyelinating disease (HCC) 12/08/2017  . Tobacco abuse     Past Surgical History:  Procedure Laterality Date  . CESAREAN SECTION N/A 05/12/2013   Procedure: Primary cesarean section with delivery of baby boy at 0754.;  Surgeon: Freddrick MarchKendra H. Tenny Crawoss, MD;  Location: [facility];  Service: Obstetrics;  Laterality: N/A;  . REMOVAL OF IMPLANT Left 09/25/2015   Procedure: LEFT ANKLE REMOVAL OF DEEP  IMPLANT DEBRIDEMENT OF ANTERIOR MEDIAL ANKLE JOINT;  Surgeon: Toni ArthursJohn Hewitt, MD;  Location: [facility];  Service: Orthopedics;  Laterality: Left;  . TIBIA FRACTURE SURGERY       OB History    Gravida  1   Para  1   Term  1   Preterm      AB      Living  1     SAB      TAB      Ectopic      Multiple     Live Births  1            Home Medications    Prior to Admission medications   Medication Sig Start Date End Date Taking? Authorizing Provider  cholecalciferol (VITAMIN D) 1000 units tablet Take 2,000 Units by mouth daily.    Yes [provider]  DULoxetine (CYMBALTA) 60 MG capsule Take 60 mg by mouth daily.  08/08/18  Yes [provider]  benzonatate (TESSALON) 100 MG capsule Take 1 capsule (100 mg total) by mouth every 8 (eight) hours. Patient not taking: Reported on 10/25/2018 10/07/18   Petrucelli, Pleas KochSamantha R, PA-C  cyclobenzaprine (FLEXERIL) 5 MG tablet Take 0.5 tablets (2.5 mg total) by mouth 3 (three) times daily as needed for muscle spasms. Patient not taking: Reported on 10/25/2018 08/21/18   Ardith DarkParker, Caleb M, MD  fluticasone [name](FLONASE) 50 MCG/ACT nasal spray Place 1 spray into both nostrils daily. Patient not taking: Reported on 10/25/2018 10/07/18   Petrucelli, Lelon MastSamantha R, PA-C  potassium chloride SA (K-DUR,KLOR-CON) 20 MEQ tablet Take 1 tablet (20 mEq total) by mouth daily. Patient not taking: Reported on  10/25/2018 10/07/18   Petrucelli, Pleas Koch, PA-C    Family History Family History  Problem Relation Age of Onset  . Hypertension Other   . Diabetes Other   . Ulcers Mother   . GER disease Mother   . Asthma Mother   . Heart attack Mother   . Lupus Mother   . Cancer Father        kidney  . Drug abuse Father   . GER disease Sister   . Asthma Brother   . Birth defects Maternal Grandfather   . Heart attack Maternal Grandfather   . High blood pressure Maternal Grandfather   . Stroke Maternal Grandfather   . Autism Son   . Learning disabilities Son     Social History Social History   Tobacco Use  . Smoking status: Current Every Day Smoker    Packs/day: 0.25    Years: 2.00    Pack years: 0.50    Types: Cigarettes  . Smokeless tobacco: Never Used  . Tobacco comment: quit with preg  Substance Use Topics  . Alcohol use: Not Currently  . Drug  use: Yes    Types: Marijuana    Comment: daily use     Allergies   Baclofen and Tizanidine   Review of Systems Review of Systems  Cardiovascular: Positive for chest pain.  All other systems reviewed and are negative.    Physical Exam Updated Vital Signs BP 138/84   Pulse 76   Resp 15   LMP 08/25/2018   SpO2 100%   Physical Exam Vitals signs and nursing note reviewed.  Constitutional:      Appearance: She is well-developed.  HENT:     Head: Normocephalic and atraumatic.  Eyes:     Extraocular Movements: Extraocular movements intact.     Pupils: Pupils are equal, round, and reactive to light.  Neck:     Musculoskeletal: Normal range of motion and neck supple.  Cardiovascular:     Rate and Rhythm: Normal rate and regular rhythm.     Heart sounds: Normal heart sounds.  Pulmonary:     Effort: Pulmonary effort is normal.     Breath sounds: Normal breath sounds.  Abdominal:     General: Bowel sounds are normal.     Palpations: Abdomen is soft.  Musculoskeletal: Normal range of motion.  Skin:    General: Skin is warm and dry.     Capillary Refill: Capillary refill takes less than 2 seconds.  Neurological:     General: No focal deficit present.     Mental Status: She is alert and oriented to person, place, and time.  Psychiatric:        Mood and Affect: Mood normal.        Behavior: Behavior normal.      ED Treatments / Results  Labs (all labs ordered are listed, but only abnormal results are displayed) Labs Reviewed  BASIC METABOLIC PANEL - Abnormal; Notable for the following components:      Result Value   CO2 20 (*)    Glucose, Bld 191 (*)    All other components within normal limits  CBC - Abnormal; Notable for the following components:   Platelets 429 (*)    All other components within normal limits  D-DIMER, QUANTITATIVE (NOT AT [facility]) - Abnormal; Notable for the following components:   D-Dimer, Quant 0.92 (*)    All other components within normal  limits  I-STAT TROPONIN, ED    EKG EKG  Interpretation  Date/Time:  Wednesday October 25 2018 21:09:10 EST Ventricular Rate:  82 PR Interval:  156 QRS Duration: 78 QT Interval:  374 QTC Calculation: 436 R Axis:   39 Text Interpretation:  Sinus rhythm with marked sinus arrhythmia Otherwise normal ECG No significant change since last tracing Confirmed by Jacalyn Lefevre [phone]) on 10/25/2018 9:22:49 PM   Radiology Dg Chest 2 View  Result Date: 10/25/2018 CLINICAL DATA:  Chest pain EXAM: CHEST - 2 VIEW COMPARISON:  10/07/2018 FINDINGS: Previously noted right mid lung zone nodular opacity is not well seen today. No acute consolidation or effusion. Normal heart size. No pneumothorax. IMPRESSION: No active cardiopulmonary disease. Electronically Signed   By: Jasmine Pang M.D.   On: 10/25/2018 22:12    Procedures Procedures (including critical care time)  Medications Ordered in ED Medications  iopamidol (ISOVUE-370) 76 % injection (has no administration in time range)  morphine 4 MG/ML injection 4 mg (has no administration in time range)  ondansetron (ZOFRAN) injection 4 mg (has no administration in time range)  sodium chloride 0.9 % bolus 1,000 mL (1,000 mLs Intravenous New Bag/Given 10/25/18 2128)  ketorolac (TORADOL) 30 MG/ML injection 30 mg (30 mg Intravenous Given 10/25/18 2128)  iopamidol (ISOVUE-370) 76 % injection 100 mL (100 mLs Intravenous Contrast Given 10/25/18 2230)     Initial Impression / Assessment and Plan / ED Course  I have reviewed the triage vital signs and the nursing notes.  Pertinent labs & imaging results that were available during my care of the patient were reviewed by me and considered in my medical decision making (see chart for details).     Pain is atypical for CAD.  Heart score of 1.  DDimer is +, so a CT chest has been ordered.   Pt is not showing any signs of an allergic reaction.  Pt signed out to PA [place].  If CT is ok, pt can go home with meds  for pain.  Final Clinical Impressions(s) / ED Diagnoses   Final diagnoses:  Atypical chest pain    ED Discharge Orders    None       Jacalyn Lefevre, MD 10/25/18 2246

## 2018-10-25 NOTE — ED Provider Notes (Signed)
Patient with chest pain after ocrevus infusion today.  Pain reproducible with palpation and worsened with movement. Plan per Dr. Particia Nearing is to DC with some pain meds pending normal CT PE study.  CT is negative for intrathoracic abnormality or PE.  Will DC and have patient follow-up with PCP and neurologist.   Roxy Horseman, PA-C 10/25/18 2320    Jacalyn Lefevre, MD 10/26/18 1101

## 2018-10-25 NOTE — Discharge Instructions (Signed)
Beginning Jan. 1, 2020, all pain medication must be electronically prescribed. Your prescription has been sent to the [facility] listed because it is the only 24 hour pharmacy.

## 2018-10-25 NOTE — ED Notes (Signed)
Patient transported to X-ray 

## 2018-10-25 NOTE — ED Triage Notes (Signed)
Pt c/o substernal chest pain that started at 6pm. Described as burning and sharp sensation with throbbing in her throat. CP gets worse on palpation, movement, and inspiration. Pt received her first dose of Ocrevus at [facility] today for MS. Had an allergic reaction to medication and needed IV benadryl then discharged home after observation. Fire dept gave 324mg  ASA

## 2018-12-19 ENCOUNTER — Other Ambulatory Visit: Payer: Self-pay

## 2018-12-19 ENCOUNTER — Encounter: Payer: Self-pay | Admitting: Family Medicine

## 2018-12-19 ENCOUNTER — Ambulatory Visit (INDEPENDENT_AMBULATORY_CARE_PROVIDER_SITE_OTHER): Payer: BLUE CROSS/BLUE SHIELD | Admitting: Family Medicine

## 2018-12-19 VITALS — BP 124/74 | HR 69 | Temp 97.9°F | Ht 65.0 in | Wt 237.2 lb

## 2018-12-19 DIAGNOSIS — F419 Anxiety disorder, unspecified: Secondary | ICD-10-CM | POA: Insufficient documentation

## 2018-12-19 DIAGNOSIS — R739 Hyperglycemia, unspecified: Secondary | ICD-10-CM | POA: Diagnosis not present

## 2018-12-19 DIAGNOSIS — G35 Multiple sclerosis: Secondary | ICD-10-CM | POA: Diagnosis not present

## 2018-12-19 DIAGNOSIS — G379 Demyelinating disease of central nervous system, unspecified: Secondary | ICD-10-CM

## 2018-12-19 DIAGNOSIS — F17200 Nicotine dependence, unspecified, uncomplicated: Secondary | ICD-10-CM | POA: Diagnosis not present

## 2018-12-19 DIAGNOSIS — F321 Major depressive disorder, single episode, moderate: Secondary | ICD-10-CM | POA: Insufficient documentation

## 2018-12-19 DIAGNOSIS — Z6839 Body mass index (BMI) 39.0-39.9, adult: Secondary | ICD-10-CM

## 2018-12-19 DIAGNOSIS — Z13220 Encounter for screening for lipoid disorders: Secondary | ICD-10-CM

## 2018-12-19 LAB — LIPID PANEL
Cholesterol: 202 mg/dL — ABNORMAL HIGH (ref 0–200)
HDL: 32.9 mg/dL — AB (ref >39.00)
LDL Cholesterol: 143 mg/dL — ABNORMAL HIGH (ref 0–99)
NonHDL: 168.61
Total CHOL/HDL Ratio: 6
Triglycerides: 127 mg/dL (ref 0.0–149.0)
VLDL: 25.4 mg/dL (ref 0.0–40.0)

## 2018-12-19 LAB — CBC
HCT: 43 % (ref 36.0–46.0)
Hemoglobin: 14.4 g/dL (ref 12.0–15.0)
MCHC: 33.4 g/dL (ref 30.0–36.0)
MCV: 85.2 fl (ref 78.0–100.0)
PLATELETS: 412 10*3/uL — AB (ref 150.0–400.0)
RBC: 5.04 Mil/uL (ref 3.87–5.11)
RDW: 14.8 % (ref 11.5–15.5)
WBC: 5.9 10*3/uL (ref 4.0–10.5)

## 2018-12-19 LAB — TSH: TSH: 0.91 u[IU]/mL (ref 0.35–4.50)

## 2018-12-19 LAB — COMPREHENSIVE METABOLIC PANEL
ALT: 11 U/L (ref 0–35)
AST: 12 U/L (ref 0–37)
Albumin: 3.9 g/dL (ref 3.5–5.2)
Alkaline Phosphatase: 86 U/L (ref 39–117)
BUN: 10 mg/dL (ref 6–23)
CO2: 29 mEq/L (ref 19–32)
Calcium: 9.6 mg/dL (ref 8.4–10.5)
Chloride: 106 mEq/L (ref 96–112)
Creatinine, Ser: 0.67 mg/dL (ref 0.40–1.20)
GFR: 123.39 mL/min (ref >60.00)
Glucose, Bld: 107 mg/dL — ABNORMAL HIGH (ref 70–99)
Potassium: 3.9 mEq/L (ref 3.5–5.1)
Sodium: 141 mEq/L (ref 135–145)
Total Bilirubin: 0.5 mg/dL (ref 0.2–1.2)
Total Protein: 7 g/dL (ref 6.0–8.3)

## 2018-12-19 LAB — HEMOGLOBIN A1C: Hgb A1c MFr Bld: 6.4 % (ref 4.6–6.5)

## 2018-12-19 MED ORDER — VARENICLINE TARTRATE 0.5 MG X 11 & 1 MG X 42 PO MISC: ORAL | 0 refills | Status: DC

## 2018-12-19 MED ORDER — VENLAFAXINE HCL ER 150 MG PO CP24: 150.0000 mg | ORAL_CAPSULE | Freq: Every day | ORAL | 0 refills | Status: DC

## 2018-12-19 NOTE — Assessment & Plan Note (Signed)
Continue management per neurology. 

## 2018-12-19 NOTE — Assessment & Plan Note (Addendum)
PHQ score significantly elevated.  Will switch from Cymbalta to Effexor.  No active SI or HI.  Follow-up with me in 4 to 6 weeks.  Check CBC, CMET, TSH.

## 2018-12-19 NOTE — Patient Instructions (Signed)
It was very nice to see you today!  Please start the effexor. Stop the cymbalta.  Let me know if you have any side effects.  Please let me know in 4 to 6 weeks how you are doing with this change.  We will check blood work today.   Eat at least 3 REAL meals and 1-2 snacks per day.  Aim for no more than 5 hours between eating.  Eat breakfast within one hour of getting up.    Obtain twice as many fruits/vegetables as protein or carbohydrate foods for both lunch and dinner.   Cut down on sweet beverages. This includes juice, soda, and sweet tea.    Exercise at least 150 minutes every week.    Take care, Dr Jerline Pain   Preventive Care 32-39 Years, Female Preventive care refers to lifestyle choices and visits with your health care provider that can promote health and wellness. What does preventive care include?   A yearly physical exam. This is also called an annual well check.  Dental exams once or twice a year.  Routine eye exams. Ask your health care provider how often you should have your eyes checked.  Personal lifestyle choices, including: ? Daily care of your teeth and gums. ? Regular physical activity. ? Eating a healthy diet. ? Avoiding tobacco and drug use. ? Limiting alcohol use. ? Practicing safe sex. ? Taking vitamin and mineral supplements as recommended by your health care provider. What happens during an annual well check? The services and screenings done by your health care provider during your annual well check will depend on your age, overall health, lifestyle risk factors, and family history of disease. Counseling Your health care provider may ask you questions about your:  Alcohol use.  Tobacco use.  Drug use.  Emotional well-being.  Home and relationship well-being.  Sexual activity.  Eating habits.  Work and work Statistician.  Method of birth control.  Menstrual cycle.  Pregnancy history. Screening You may have the following tests or  measurements:  Height, weight, and BMI.  Diabetes screening. This is done by checking your blood sugar (glucose) after you have not eaten for a while (fasting).  Blood pressure.  Lipid and cholesterol levels. These may be checked every 5 years starting at age 32.  Skin check.  Hepatitis C blood test.  Hepatitis B blood test.  Sexually transmitted disease (STD) testing.  BRCA-related cancer screening. This may be done if you have a family history of breast, ovarian, tubal, or peritoneal cancers.  Pelvic exam and Pap test. This may be done every 3 years starting at age 32. Starting at age 51, this may be done every 5 years if you have a Pap test in combination with an HPV test. Discuss your test results, treatment options, and if necessary, the need for more tests with your health care provider. Vaccines Your health care provider may recommend certain vaccines, such as:  Influenza vaccine. This is recommended every year.  Tetanus, diphtheria, and acellular pertussis (Tdap, Td) vaccine. You may need a Td booster every 10 years.  Varicella vaccine. You may need this if you have not been vaccinated.  HPV vaccine. If you are 44 or younger, you may need three doses over 6 months.  Measles, mumps, and rubella (MMR) vaccine. You may need at least one dose of MMR. You may also need a second dose.  Pneumococcal 13-valent conjugate (PCV13) vaccine. You may need this if you have certain conditions and were not previously  vaccinated.  Pneumococcal polysaccharide (PPSV23) vaccine. You may need one or two doses if you smoke cigarettes or if you have certain conditions.  Meningococcal vaccine. One dose is recommended if you are age 24-21 years and a first-year college student living in a residence hall, or if you have one of several medical conditions. You may also need additional booster doses.  Hepatitis A vaccine. You may need this if you have certain conditions or if you travel or work in  places where you may be exposed to hepatitis A.  Hepatitis B vaccine. You may need this if you have certain conditions or if you travel or work in places where you may be exposed to hepatitis B.  Haemophilus influenzae type b (Hib) vaccine. You may need this if you have certain risk factors. Talk to your health care provider about which screenings and vaccines you need and how often you need them. This information is not intended to replace advice given to you by your health care provider. Make sure you discuss any questions you have with your health care provider. Document Released: 11/16/2001 Document Revised: 05/03/2017 Document Reviewed: 07/22/2015 Elsevier Interactive Patient Education  2019 [employer].

## 2018-12-19 NOTE — Progress Notes (Signed)
Chief Complaint:  Samantha [name] is a 33 y.o. female who presents today for her annual comprehensive physical exam.    Assessment/Plan:  Nicotine dependence with current use Patient was asked about her tobacco use today and was strongly advised to quit. Patient is currently contemplative. We reviewed treatment options to assist her quit smoking including NRT, Chantix, and Bupropion.  She would like to start Chantix.  Advised her to start in a few weeks after switching her antidepressant.  She voiced understanding.  Follow up at next office visit.   Total time spent counseling approximately 3 minutes.    MS (multiple sclerosis) (HCC) Continue management per neurology.  Depression, major, single episode, moderate (HCC) PHQ score significantly elevated.  Will switch from Cymbalta to Effexor.  No active SI or HI.  Follow-up with me in 4 to 6 weeks.  Check CBC, CMET, TSH.   Anxiety GAD elevated.  We will switch from Cymbalta to Effexor as noted above.  Follow-up with me in 4 to 6 weeks.  Hyperglycemia Check A1c  BMI 39 Discussed lifestyle modifications.  Preventative Healthcare: Check lipid panel. Will follow up with OBGYN for pap smear.   Patient Counseling(The following topics were reviewed and/or handout was given):  -Nutrition: Stressed importance of moderation in sodium/caffeine intake, saturated fat and cholesterol, caloric balance, sufficient intake of fresh fruits, vegetables, and fiber.  -Stressed the importance of regular exercise.   -Substance Abuse: Discussed cessation/primary prevention of tobacco, alcohol, or other drug use; driving or other dangerous activities under the influence; availability of treatment for abuse.   -Injury prevention: Discussed safety belts, safety helmets, smoke detector, smoking near bedding or upholstery.   -Sexuality: Discussed sexually transmitted diseases, partner selection, use of condoms, avoidance of unintended pregnancy and  contraceptive alternatives.   -Dental health: Discussed importance of regular tooth brushing, flossing, and dental visits.  -Health maintenance and immunizations reviewed. Please refer to Health maintenance section.  Return to care in 1 year for next preventative visit.     Subjective:  HPI:  She has no acute complaints today.   Her chronic medical conditions are outlined below:  % Multiple sclerosis - Follows with neurology  # Depression / Anxiety Currently on Cymbalta 60 mg daily for neuropathic pain.  She has had worsening depression and anxiety symptoms over the last few months.  Is unfortunately going through divorce as well.  She is seeing counselor which seems to be helping.  She has interested in switching medications.  She has had suicidal ideation in the past but has no specific plans.  #Nicotine dependence Is interested in quitting.  Would like to start Chantix.  Lifestyle Diet: No specific diets or eating plans.  Exercise: No specific exercises.   Depression screen PHQ 2/9 12/19/2018  Decreased Interest 3  Down, Depressed, Hopeless 3  PHQ - 2 Score 6  Altered sleeping 2  Tired, decreased energy 2  Change in appetite 3  Feeling bad or failure about yourself  3  Trouble concentrating 3  Moving slowly or fidgety/restless 3  Suicidal thoughts 3  PHQ-9 Score 25  Difficult doing work/chores Extremely dIfficult    GAD 7 : Generalized Anxiety Score 12/19/2018  Nervous, Anxious, on Edge 2  Control/stop worrying 3  Worry too much - different things 3  Trouble relaxing 3  Restless 3  Easily annoyed or irritable 3  Afraid - awful might happen 2  Total GAD 7 Score 19  Anxiety Difficulty Extremely difficult   Health Maintenance  Due  Topic Date Due  . PAP SMEAR-Modifier  01/12/2019     ROS: Per HPI, otherwise a complete review of systems was negative.   PMH:  The following were reviewed and entered/updated in epic: Past Medical History:  Diagnosis Date  .  Arthritis   . Chicken pox   . Chlamydia 12/02/2011  . Depression   . Fracture of left leg 2009   removal metal plate in left leg 2016  . Frequent headaches   . Infection    UTI  . Kidney stone    Kidney stone  . Migraines   . Multiple sclerosis (HCC)    receives care @ [facility]  . Neuropathy   . PCOS (polycystic ovarian syndrome)   . Tobacco abuse    Patient Active Problem List   Diagnosis Date Noted  . Depression, major, single episode, moderate (HCC) 12/19/2018  . Anxiety 12/19/2018  . MS (multiple sclerosis) (HCC) 12/16/2017  . Nicotine dependence with current use    Past Surgical History:  Procedure Laterality Date  . CESAREAN SECTION N/A 05/12/2013   Procedure: Primary cesarean section with delivery of baby boy at 0754.;  Surgeon: Freddrick March. Tenny Craw, MD;  Location: [facility];  Service: Obstetrics;  Laterality: N/A;  . REMOVAL OF IMPLANT Left 09/25/2015   Procedure: LEFT ANKLE REMOVAL OF DEEP  IMPLANT DEBRIDEMENT OF ANTERIOR MEDIAL ANKLE JOINT;  Surgeon: Toni Arthurs, MD;  Location: [facility];  Service: Orthopedics;  Laterality: Left;  . TIBIA FRACTURE SURGERY      Family History  Problem Relation Age of Onset  . Hypertension Other   . Diabetes Other   . Ulcers Mother   . GER disease Mother   . Asthma Mother   . Heart attack Mother   . Lupus Mother   . Cancer Father        kidney  . Drug abuse Father   . GER disease Sister   . Asthma Brother   . Birth defects Maternal Grandfather   . Heart attack Maternal Grandfather   . High blood pressure Maternal Grandfather   . Stroke Maternal Grandfather   . Autism Son   . Learning disabilities Son     Medications- reviewed and updated Current Outpatient Medications  Medication Sig Dispense Refill  . cholecalciferol (VITAMIN D) 1000 units tablet Take 2,000 Units by mouth daily.     . varenicline (CHANTIX STARTING MONTH PAK) 0.5 MG X 11 & 1 MG X 42 tablet Take one 0.5 mg tablet by mouth once daily for 3  days, then increase to one 0.5 mg tablet twice daily for 4 days, then increase to one 1 mg tablet twice daily. 53 tablet 0  . venlafaxine XR (EFFEXOR XR) 150 MG 24 hr capsule Take 1 capsule (150 mg total) by mouth daily with breakfast. 30 capsule 0   No current facility-administered medications for this visit.     Allergies-reviewed and updated Allergies  Allergen Reactions  . Baclofen Other (See Comments)    Dizziness, feel foggy, "head feels funny"  . Tizanidine Other (See Comments)    Dizziness, feel foggy, "head feels funny"    Social History   Socioeconomic History  . Marital status: Married    Spouse name: Not on file  . Number of children: Not on file  . Years of education: Not on file  . Highest education level: Not on file  Occupational History  . Not on file  Social Needs  . Financial resource strain:  Not on file  . Food insecurity:    Worry: Not on file    Inability: Not on file  . Transportation needs:    Medical: Not on file    Non-medical: Not on file  Tobacco Use  . Smoking status: Current Every Day Smoker    Packs/day: 0.25    Years: 2.00    Pack years: 0.50    Types: Cigarettes  . Smokeless tobacco: Never Used  . Tobacco comment: quit with preg  Substance and Sexual Activity  . Alcohol use: Not Currently  . Drug use: Yes    Types: Marijuana    Comment: daily use  . Sexual activity: Yes    Birth control/protection: None  Lifestyle  . Physical activity:    Days per week: Not on file    Minutes per session: Not on file  . Stress: Not on file  Relationships  . Social connections:    Talks on phone: Not on file    Gets together: Not on file    Attends religious service: Not on file    Active member of club or organization: Not on file    Attends meetings of clubs or organizations: Not on file    Relationship status: Not on file  Other Topics Concern  . Not on file  Social History Narrative   Married, husband is truck Hospital doctor.  Lives with  husband, son and sister.  Education some college.  Children 1.  Caffeine 2-3 sodas daily.    has a 50-year-old that is autistic.    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 Wt Readings from Last 3 Encounters:  12/19/18 237 lb 3.2 oz (107.6 kg)  10/06/18 243 lb (110.2 kg)  10/06/18 243 lb (110.2 kg)   Gen: NAD, resting comfortably HEENT: TMs normal bilaterally. OP clear. No thyromegaly noted.  CV: RRR with no murmurs appreciated Pulm: NWOB, CTAB with no crackles, wheezes, or rhonchi GI: Normal bowel sounds present. Soft, Nontender, Nondistended. MSK: no edema, cyanosis, or clubbing noted Skin: warm, dry Neuro: CN2-12 grossly intact. Strength 5/5 in upper and lower extremities. Reflexes symmetric and intact bilaterally.  Psych: Normal affect and thought content     Caleb M. [name], MD 12/19/2018 3:11 PM

## 2018-12-19 NOTE — Assessment & Plan Note (Signed)
Patient was asked about her tobacco use today and was strongly advised to quit. Patient is currently contemplative. We reviewed treatment options to assist her quit smoking including NRT, Chantix, and Bupropion.  She would like to start Chantix.  Advised her to start in a few weeks after switching her antidepressant.  She voiced understanding.  Follow up at next office visit.   Total time spent counseling approximately 3 minutes.

## 2018-12-19 NOTE — Assessment & Plan Note (Signed)
GAD elevated.  We will switch from Cymbalta to Effexor as noted above.  Follow-up with me in 4 to 6 weeks.

## 2018-12-20 ENCOUNTER — Encounter: Payer: Self-pay | Admitting: Family Medicine

## 2018-12-20 DIAGNOSIS — R7303 Prediabetes: Secondary | ICD-10-CM | POA: Insufficient documentation

## 2018-12-20 DIAGNOSIS — E785 Hyperlipidemia, unspecified: Secondary | ICD-10-CM | POA: Insufficient documentation

## 2018-12-20 NOTE — Progress Notes (Signed)
Please inform patient of the following:  Her blood sugar is borderline elevated - recommend starting metformin XR 750mg  daily to improve numbers and prevent progression to diabetes. Her cholesterol is elevated. Do not need to start medications. Recommend continuing to work on diet and exercise and we can recheck in a year.  All of her other labs are NORMAL.  Samantha [name]. [name], MD 12/20/2018 7:52 AM

## 2018-12-21 ENCOUNTER — Other Ambulatory Visit: Payer: Self-pay

## 2018-12-21 MED ORDER — METFORMIN HCL ER 750 MG PO TB24: 750.0000 mg | ORAL_TABLET | Freq: Every day | ORAL | 0 refills | Status: DC

## 2019-01-10 ENCOUNTER — Other Ambulatory Visit: Payer: Self-pay | Admitting: Family Medicine

## 2019-01-13 ENCOUNTER — Ambulatory Visit (HOSPITAL_COMMUNITY)
Admission: EM | Admit: 2019-01-13 | Discharge: 2019-01-13 | Disposition: A | Discharge status: Home / Self Care | Payer: BLUE CROSS/BLUE SHIELD | Attending: Emergency Medicine | Admitting: Emergency Medicine

## 2019-01-13 ENCOUNTER — Encounter (HOSPITAL_COMMUNITY): Payer: Self-pay

## 2019-01-13 ENCOUNTER — Other Ambulatory Visit: Payer: Self-pay

## 2019-01-13 DIAGNOSIS — B9789 Other viral agents as the cause of diseases classified elsewhere: Secondary | ICD-10-CM | POA: Diagnosis not present

## 2019-01-13 DIAGNOSIS — J069 Acute upper respiratory infection, unspecified: Secondary | ICD-10-CM

## 2019-01-13 MED ORDER — FEXOFENADINE HCL 60 MG PO TABS: 60.0000 mg | ORAL_TABLET | Freq: Two times a day (BID) | ORAL | 0 refills | Status: DC

## 2019-01-13 NOTE — Discharge Instructions (Addendum)
Get plenty of rest and push fluids Allegra prescribed for nasal congestion, runny nose, and/or sore throat Use medications daily for symptom relief Follow up with PCP or neurologist in 24-48 hours for recheck Call or go to ER if you have any new or worsening symptoms fever, chills, vision changes, nausea, vomiting, chest pain, cough, shortness of breath, wheezing, abdominal pain, changes in bowel or bladder habits, no improvement in symptoms with medication, etc..Marland Kitchen

## 2019-01-13 NOTE — ED Provider Notes (Addendum)
[facility]   [id number] 01/13/19 Arrival Time: 1643   CC: URI symptoms   SUBJECTIVE: History from: patient.  Samantha [name] is a 33 y.o. female hx significant for arthritis, depression, kidney stones, MS, neuropathy, PCOS, and tobacco use, who presents with abrupt onset of nasal congestion, runny nose, mild productive cough, SOB, fatigue, and mild diarrhea x 3 days.  Denies sick exposure to COVID, flu or strep.  Denies recent travel. Has NOT tried OTC medications.  Symptoms are made worse at night.  Reports previous symptoms in the past with a cold.   Denies fever, chills, sinus pain, sore throat, wheezing, chest pain, nausea, changes in bowel or bladder habits.    ROS: As per HPI.  Past Medical History:  Diagnosis Date  . Arthritis   . Chicken pox   . Chlamydia 12/02/2011  . Depression   . Fracture of left leg 2009   removal metal plate in left leg 2016  . Frequent headaches   . Infection    UTI  . Kidney stone    Kidney stone  . Migraines   . Multiple sclerosis (HCC)    receives care @ [facility]  . Neuropathy   . PCOS (polycystic ovarian syndrome)   . Tobacco abuse    Past Surgical History:  Procedure Laterality Date  . CESAREAN SECTION N/A 05/12/2013   Procedure: Primary cesarean section with delivery of baby boy at 0754.;  Surgeon: Freddrick March. Tenny Craw, MD;  Location: [facility];  Service: Obstetrics;  Laterality: N/A;  . REMOVAL OF IMPLANT Left 09/25/2015   Procedure: LEFT ANKLE REMOVAL OF DEEP  IMPLANT DEBRIDEMENT OF ANTERIOR MEDIAL ANKLE JOINT;  Surgeon: Toni Arthurs, MD;  Location: [facility];  Service: Orthopedics;  Laterality: Left;  . TIBIA FRACTURE SURGERY     Allergies  Allergen Reactions  . Baclofen Other (See Comments)    Dizziness, feel foggy, "head feels funny"  . Tizanidine Other (See Comments)    Dizziness, feel foggy, "head feels funny"   No current facility-administered medications on file prior to encounter.    Current  Outpatient Medications on File Prior to Encounter  Medication Sig Dispense Refill  . cholecalciferol (VITAMIN D) 1000 units tablet Take 2,000 Units by mouth daily.     . metFORMIN (GLUCOPHAGE XR) 750 MG 24 hr tablet Take 1 tablet (750 mg total) by mouth daily with breakfast. 90 tablet 0  . varenicline (CHANTIX STARTING MONTH PAK) 0.5 MG X 11 & 1 MG X 42 tablet Take one 0.5 mg tablet by mouth once daily for 3 days, then increase to one 0.5 mg tablet twice daily for 4 days, then increase to one 1 mg tablet twice daily. 53 tablet 0  . venlafaxine XR (EFFEXOR-XR) 150 MG 24 hr capsule TAKE 1 CAPSULE (150 MG TOTAL) BY MOUTH DAILY WITH BREAKFAST. 30 capsule 0   Social History   Socioeconomic History  . Marital status: Married    Spouse name: Not on file  . Number of children: Not on file  . Years of education: Not on file  . Highest education level: Not on file  Occupational History  . Not on file  Social Needs  . Financial resource strain: Not on file  . Food insecurity:    Worry: Not on file    Inability: Not on file  . Transportation needs:    Medical: Not on file    Non-medical: Not on file  Tobacco Use  . Smoking status: Current Every  Day Smoker    Packs/day: 0.25    Years: 2.00    Pack years: 0.50    Types: Cigarettes  . Smokeless tobacco: Never Used  . Tobacco comment: quit with preg  Substance and Sexual Activity  . Alcohol use: Not Currently  . Drug use: Yes    Types: Marijuana    Comment: daily use  . Sexual activity: Yes    Birth control/protection: None  Lifestyle  . Physical activity:    Days per week: Not on file    Minutes per session: Not on file  . Stress: Not on file  Relationships  . Social connections:    Talks on phone: Not on file    Gets together: Not on file    Attends religious service: Not on file    Active member of club or organization: Not on file    Attends meetings of clubs or organizations: Not on file    Relationship status: Not on file   . Intimate partner violence:    Fear of current or ex partner: Not on file    Emotionally abused: Not on file    Physically abused: Not on file    Forced sexual activity: Not on file  Other Topics Concern  . Not on file  Social History Narrative   Married, husband is truck Hospital doctor.  Lives with husband, son and sister.  Education some college.  Children 1.  Caffeine 2-3 sodas daily.    has a 71-year-old that is autistic.    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 Hypertension Other   . Diabetes Other   . Ulcers Mother   . GER disease Mother   . Asthma Mother   . Heart attack Mother   . Lupus Mother   . Cancer Father        kidney  . Drug abuse Father   . GER disease Sister   . Asthma Brother   . Birth defects Maternal Grandfather   . Heart attack Maternal Grandfather   . High blood pressure Maternal Grandfather   . Stroke Maternal Grandfather   . Autism Son   . Learning disabilities Son     OBJECTIVE:  Vitals:   01/13/19 1655  BP: (!) 146/91  Pulse: 71  Resp: 18  Temp: 98.4 F (36.9 C)  TempSrc: Oral  SpO2: 100%     General appearance: alert; appears mildly fatigued, but nontoxic; speaking in full sentences and tolerating own secretions HEENT: NCAT; Ears: EACs clear, TMs pearly gray; Eyes: PERRL.  EOM grossly intact. Nose: nares patent without rhinorrhea, Throat: oropharynx clear, tonsils non erythematous or enlarged, uvula midline  Neck: supple without LAD Lungs: unlabored respirations, symmetrical air entry; cough: absent; no respiratory distress; CTAB Heart: regular rate and rhythm.  Radial pulses 2+ symmetrical bilaterally Skin: warm and dry Psychological: alert and cooperative; normal mood and affect  ASSESSMENT & PLAN:  1. Viral URI with cough    Discussed patient case with Dr. Milus Glazier.    Meds ordered this encounter  Medications  . fexofenadine (ALLEGRA) 60 MG tablet     Sig: Take 1 tablet (60 mg total) by mouth 2 (two) times daily.    Dispense:  20 tablet    Refill:  0    Order Specific Question:   Supervising Provider    Answer:   Eustace Moore [[id number]]    Get plenty of rest and  push fluids Allegra prescribed for nasal congestion, runny nose, and/or sore throat Use medications daily for symptom relief Follow up with PCP or neurologist in 24-48 hours for recheck Call or go to ER if you have any new or worsening symptoms fever, chills, vision changes, nausea, vomiting, chest pain, cough, shortness of breath, wheezing, abdominal pain, changes in bowel or bladder habits, no improvement in symptoms with medication, etc...  Reviewed expectations re: course of current medical issues. Questions answered. Outlined signs and symptoms indicating need for more acute intervention. Patient verbalized understanding. After Visit Summary given.         Rennis HardingWurst, Izreal Kock, PA-C 01/13/19 1725    Alvino ChapelWurst, Point BlankBrittany, PA-C 01/13/19 1725

## 2019-01-13 NOTE — ED Triage Notes (Signed)
Patient presents to Urgent Care with complaints of nasal congestion and generalized fatigue since 2 days ago. Patient states she has MS and her PCP told her to come be seen.

## 2019-01-15 ENCOUNTER — Telehealth: Payer: Self-pay | Admitting: Family Medicine

## 2019-01-15 NOTE — Telephone Encounter (Signed)
Patient Name: Samantha [name] Gender: Female DOB: 10/22/1985  Age: 33 Y 1 M 10 D Return Phone Number: [phone] (Primary), [phone] (Secondary) Address:  [place]  [address] Client [facility] Client Site [facility] Night Physician Jacquiline Doe- MD Contact Type Call Who Is Calling Patient / Member / Family / Caregiver Call Type Triage / Clinical Relationship To Patient Self Return Phone Number [phone] (Primary) Chief Complaint BREATHING - shortness of breath or sounds breathless Reason for Call Symptomatic / Request for Health Information Initial Comment Caller states she is having crazy symptoms today. Caller states she is struggling with breathing, "everything hurts", and tiredness. Translation No Nurse Assessment Nurse: Kizzie Bane, RN, Marylene Land Date/Time (Eastern Time): 01/13/2019 3:05:13 PM Confirm and document reason for call. If symptomatic, describe symptoms. ---Caller states she is having body aches and fatigue. She feels like she has trouble breathing sometimes when she is laying down Has the patient had close contact with a person known or suspected to have the novel coronavirus illness OR traveled / lives in area with major community spread (including international travel) in the last 14 days from the onset of symptoms? * If Asymptomatic, screen for exposure and travel within the last 14 days. ---No Does the patient have any new or worsening symptoms? ---Yes Will a triage be completed? ---Yes Related visit to physician within the last 2 weeks? ---No Does the PT have any chronic conditions? (i.e. diabetes, asthma, this includes High risk factors for pregnancy, etc.) ---Yes List chronic conditions. ---MS Is the patient pregnant or possibly pregnant? (Ask all females between the ages of 52-55) ---No Is this a behavioral health or substance abuse call? ---No Guidelines Guideline Title  Affirmed Question Affirmed Notes Nurse Date/Time (Eastern Time) Weakness (Generalized) and Fatigue [1] MODERATE weakness (i.e., interferes with work, school,  Kizzie Bane, Charity fundraiser, Marylene Land 01/13/2019 3:08:42 PM PLEASE NOTE:  All timestamps contained within this report are represented as [place] Standard Time. CONFIDENTIALTY NOTICE: This fax transmission is intended only for the addressee.  It contains information that is legally privileged, confidential or otherwise protected from use or disclosure.  If you are not the intended recipient, you are strictly prohibited from reviewing, disclosing, copying using or disseminating any of this information or taking any action in reliance on or regarding this information.  If you have received this fax in error, please notify us immediately by telephone so that we can arrange for its return to [place]. Phone:  [phone], Toll-Free:  [phone], Fax:  [phone] Page: 2 of 2 Call Id: [id number] Guidelines Guideline Title Affirmed Question Affirmed Notes Nurse Date/Time Lamount Cohen Time) normal activities) AND [2] cause unknown (Exceptions: weakness with acute minor illness, or weakness from poor fluid intake) Disp. Time Lamount Cohen Time) Disposition Final User 01/13/2019 3:02:38 PM Send to Urgent Bubba Camp, Rudi 01/13/2019 3:12:20 PM See HCP within 4 Hours (or PCP triage) Yes Kizzie Bane, RN, Rosalyn Charters Disagree/Comply Comply Caller Understands Yes PreDisposition Did not know what to do Care Advice Given Per Guideline SEE HCP WITHIN 4 HOURS (OR PCP TRIAGE): * IF OFFICE WILL BE CLOSED AND NO PCP (PRIMARY CARE PROVIDER) SECOND-LEVEL TRIAGE: You need to be seen within the next 3 or 4 hours. A nearby Urgent Care Center [facility]) is often a good source of care. Another choice is to go to the ED. Go sooner if you become worse. CALL BACK IF: * You become worse. CARE ADVICE given per Weakness and  Fatigue (Adult) guideline. BRING MEDICINES: * Please bring a list of your  current medicines when you go to see the doctor.

## 2019-01-15 NOTE — Telephone Encounter (Signed)
Patient was seen in ED for these symptoms.

## 2019-01-20 IMAGING — MR MR CERVICAL SPINE WO/W CM
18 of 24 series · 29 of 48 positions shown · IV contrast (Yes)
Comparison: 12/08/2017

CLINICAL DATA: History of multiple sclerosis. Numbness and tingling
the hands and arms over the last 2 months.

EXAM:
MRI CERVICAL SPINE WITHOUT AND WITH CONTRAST
TECHNIQUE: Multiplanar and multiecho pulse sequences of the cervical spine, to
include the craniocervical junction and cervicothoracic junction,
were obtained without and with intravenous contrast.
CONTRAST:  10 cc Gadavist

[Series 3: T1 · sagittal · 5.0mm · 0.47mm/px · 1 of 22 slices shown (1 of 3)]
[im 1/22]
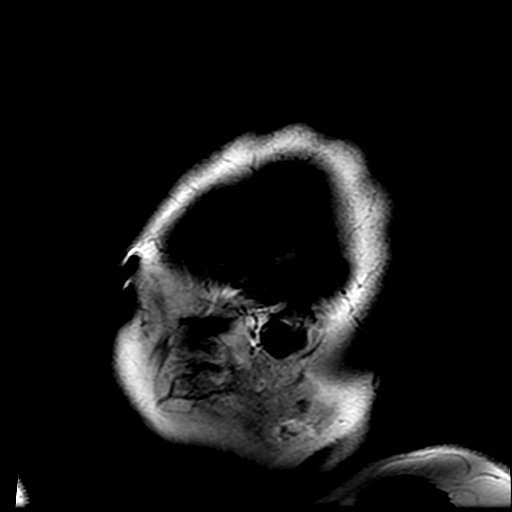

[Series 4: DWI · axial · 4.0mm · 1.17mm/px · z∈[-63,+80]mm · 2 of 66 slices shown (1 of 2)]
[im 1/66]
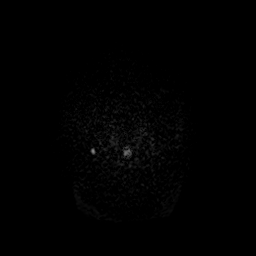
[im 66/66]
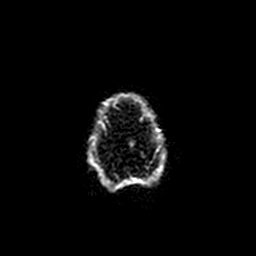

[Series 5: T2 · axial · 5.0mm · 0.86mm/px · 1 of 25 slices shown (1 of 2)]
[im 1/25]
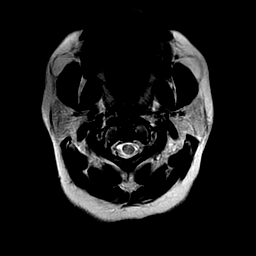

[Series 6: FLAIR · axial · 3.0mm · 0.86mm/px · 1 of 25 slices shown (1 of 2)]
[im 1/25]
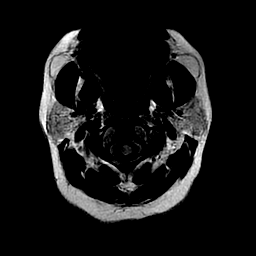

[Series 7: ax mpgr · axial · 5.0mm · 0.43mm/px · 1 of 25 slices shown]
[im 1/25]
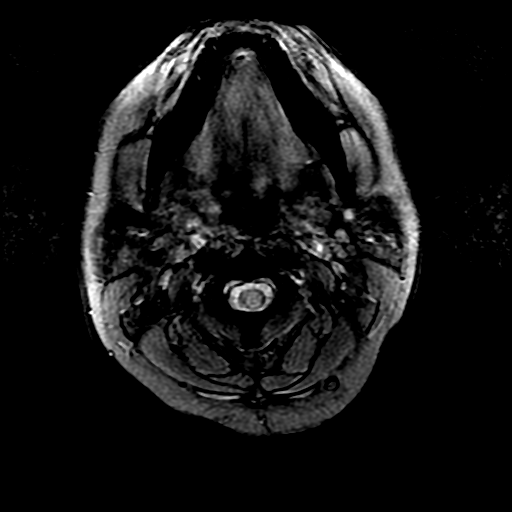

[Series 8: ax fspgr irp · axial · 3.0mm · 0.47mm/px · z∈[-65,+93]mm · 2 of 54 slices shown]
[im 1/54]
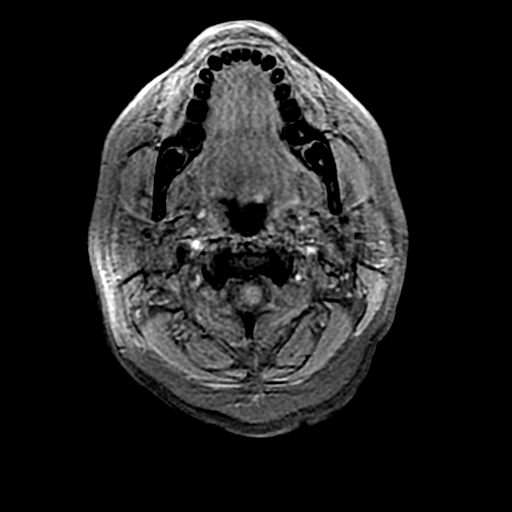
[im 54/54]
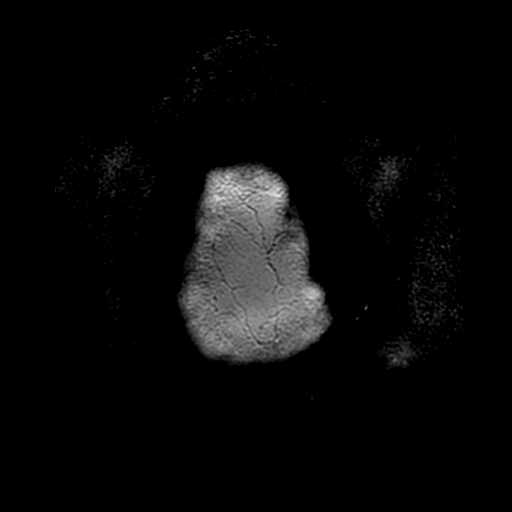

[Series 9: DWI · coronal · 4.0mm · 1.09mm/px · 4 of 84 slices shown (2 of 2)]
[im 1/84]
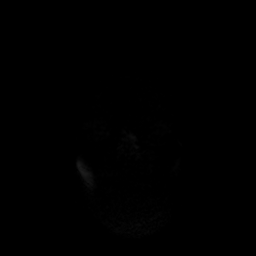
[im 28/84]
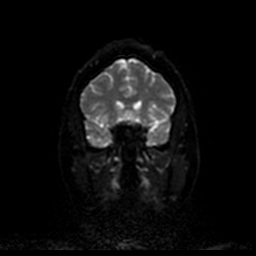
[im 56/84]
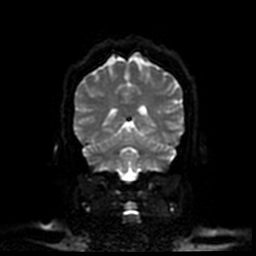
[im 84/84]
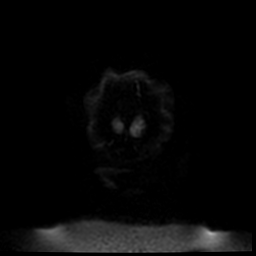

[Series 10: FLAIR · sagittal · 1.6mm · 0.49mm/px · 4 of 224 slices shown (2 of 2)]
[im 1/224]
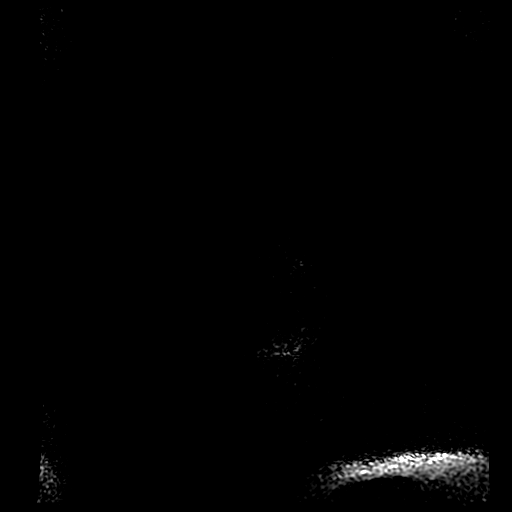
[im 25/224]
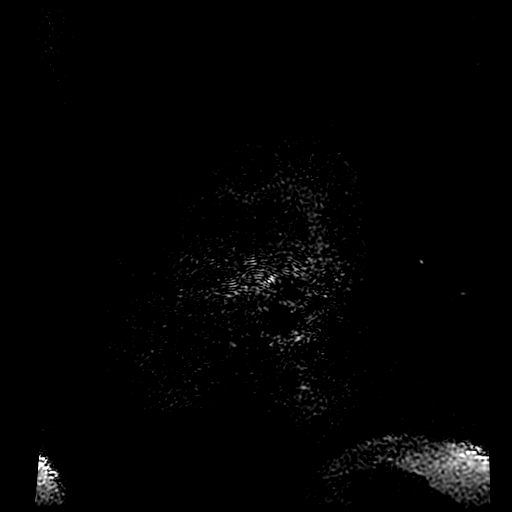
[im 75/224]
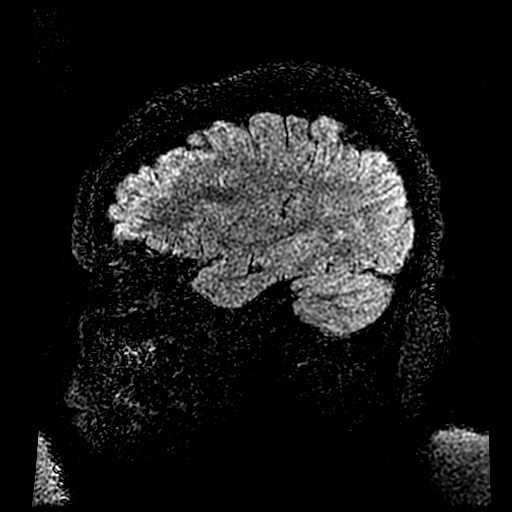
[im 100/224]
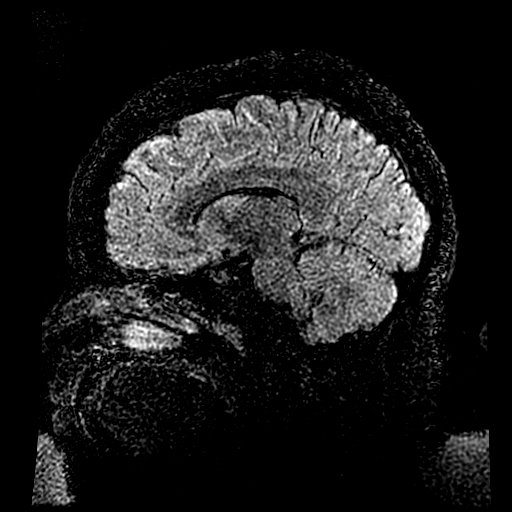

[Series 12: T1 · sagittal · 3.0mm · 0.90mm/px · 1 of 12 slices shown (2 of 3)]
[im 1/12]
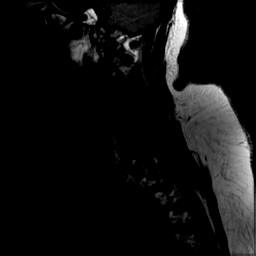

[Series 14: T2 post-contrast · sagittal · 3.0mm · 0.90mm/px · 1 of 12 slices shown (1 of 3)]
[im 1/12]
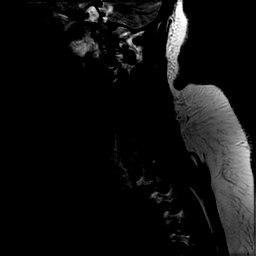

[Series 15: T2 · axial · 3.0mm · 0.78mm/px · z∈[-179,-52]mm · 2 of 39 slices shown (2 of 2)]
[im 1/39]
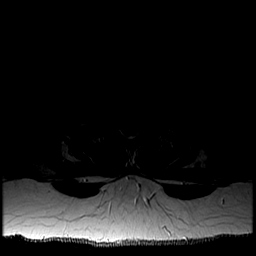
[im 39/39]
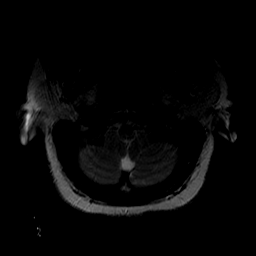

[Series 17: T1 · axial · non-contrast · 3.0mm · 0.39mm/px · z∈[-179,-52]mm · 2 of 39 slices shown (3 of 3)]
[im 1/39]
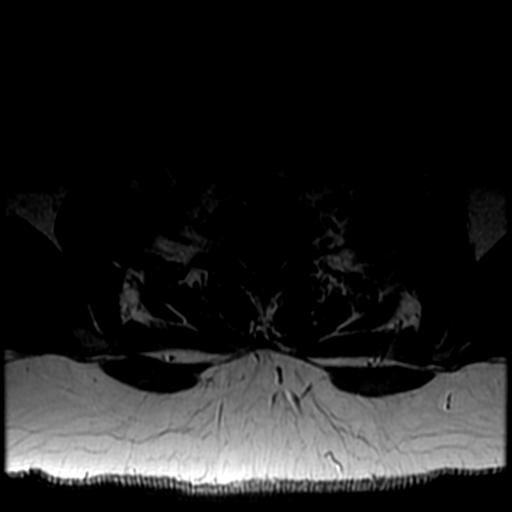
[im 39/39]
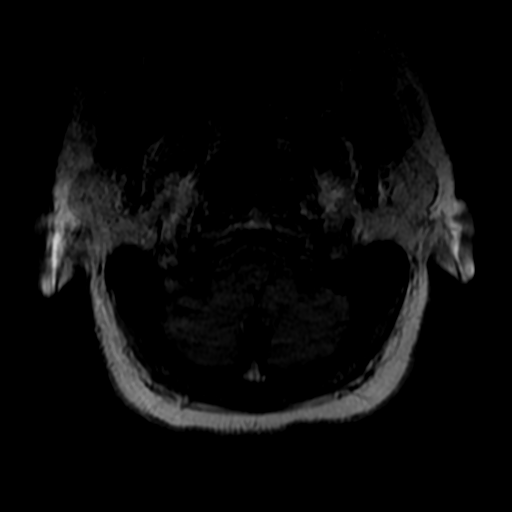

[Series 18: T2 post-contrast · sagittal · 3.0mm · 0.90mm/px · 1 of 12 slices shown (2 of 3)]
[im 1/12]
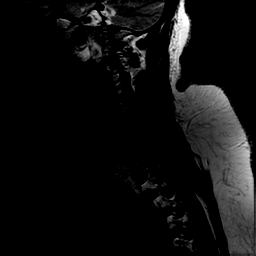

[Series 19: T1 fat-sat post-contrast · sagittal · 3.0mm · 0.90mm/px · 1 of 12 slices shown]
[im 1/12]
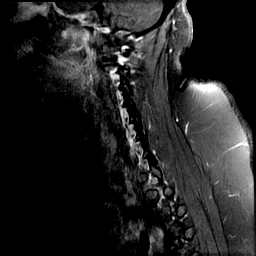

[Series 20: T1 post-contrast · axial · 3.0mm · 0.39mm/px · z∈[-179,-52]mm · 2 of 39 slices shown (1 of 3)]
[im 1/39]
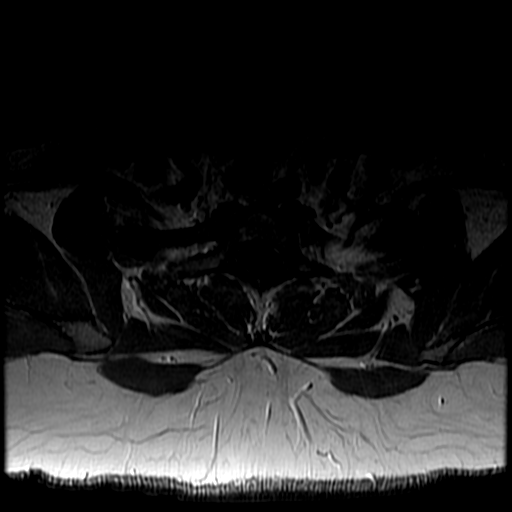
[im 39/39]
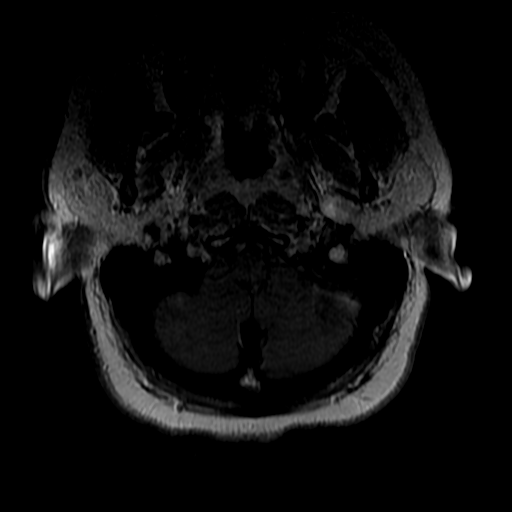

[Series 21: T2 post-contrast · coronal · 5.0mm · 0.90mm/px · 1 of 27 slices shown (3 of 3)]
[im 1/27]
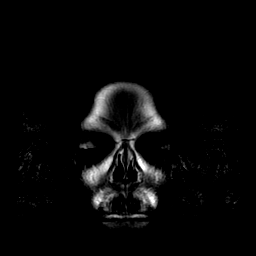

[Series 23: T1 post-contrast · coronal · 5.0mm · 0.45mm/px · 1 of 27 slices shown (2 of 3)]
[im 1/27]
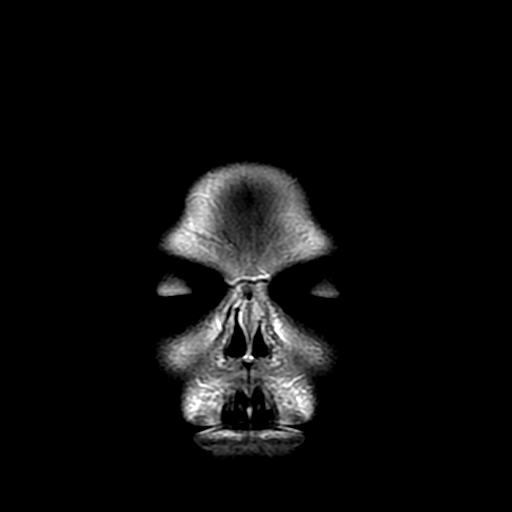

[Series 24: T1 post-contrast · sagittal · 5.0mm · 0.47mm/px · 1 of 22 slices shown (3 of 3)]
[im 1/22]
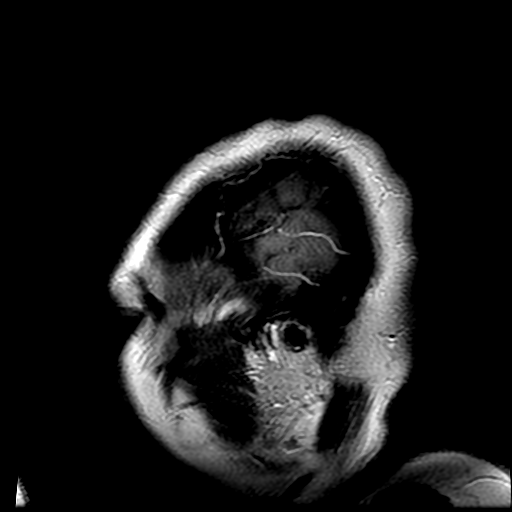

[29 of 48 positions shown; findings below may reference images not displayed]

FINDINGS: Alignment: Normal

Vertebrae: Normal

Cord: Multiple sclerosis cord lesions as seen previously, most
prominent at C1/C2, C3, C4-5, C7 and T3. There is less enhancement
of the lesions, with only mild smudgy enhancement remaining at the
C2-3 lesion. No new or progressive findings.

Posterior Fossa, vertebral arteries, paraspinal tissues: See results
of brain MRI

Disc levels:

No degenerative disc disease. No bulge or herniation. No canal or
foraminal stenosis.
IMPRESSION: Redemonstration of multiple sclerosis cord lesions throughout the
cervical and thoracic region. No new or progressive lesions compared
to the study December 2017. There is less contrast enhancement noted
today, with only mild smudgy enhancement remaining at the C3 lesion.

## 2019-02-06 ENCOUNTER — Other Ambulatory Visit: Payer: Self-pay | Admitting: Family Medicine

## 2019-05-03 ENCOUNTER — Ambulatory Visit (HOSPITAL_COMMUNITY)
Admission: EM | Admit: 2019-05-03 | Discharge: 2019-05-03 | Disposition: A | Discharge status: Home / Self Care | Payer: Self-pay | Attending: Internal Medicine | Admitting: Internal Medicine

## 2019-05-03 ENCOUNTER — Telehealth: Payer: Self-pay | Admitting: Physical Therapy

## 2019-05-03 ENCOUNTER — Other Ambulatory Visit: Payer: Self-pay

## 2019-05-03 ENCOUNTER — Encounter (HOSPITAL_COMMUNITY): Payer: Self-pay | Admitting: Emergency Medicine

## 2019-05-03 DIAGNOSIS — E785 Hyperlipidemia, unspecified: Secondary | ICD-10-CM | POA: Insufficient documentation

## 2019-05-03 DIAGNOSIS — G35 Multiple sclerosis: Secondary | ICD-10-CM | POA: Insufficient documentation

## 2019-05-03 DIAGNOSIS — A084 Viral intestinal infection, unspecified: Secondary | ICD-10-CM

## 2019-05-03 DIAGNOSIS — R7303 Prediabetes: Secondary | ICD-10-CM | POA: Insufficient documentation

## 2019-05-03 DIAGNOSIS — J029 Acute pharyngitis, unspecified: Secondary | ICD-10-CM | POA: Insufficient documentation

## 2019-05-03 DIAGNOSIS — R439 Unspecified disturbances of smell and taste: Secondary | ICD-10-CM | POA: Insufficient documentation

## 2019-05-03 DIAGNOSIS — F329 Major depressive disorder, single episode, unspecified: Secondary | ICD-10-CM | POA: Insufficient documentation

## 2019-05-03 DIAGNOSIS — Z7984 Long term (current) use of oral hypoglycemic drugs: Secondary | ICD-10-CM | POA: Insufficient documentation

## 2019-05-03 DIAGNOSIS — Z87442 Personal history of urinary calculi: Secondary | ICD-10-CM | POA: Insufficient documentation

## 2019-05-03 DIAGNOSIS — Z79899 Other long term (current) drug therapy: Secondary | ICD-10-CM | POA: Insufficient documentation

## 2019-05-03 DIAGNOSIS — G629 Polyneuropathy, unspecified: Secondary | ICD-10-CM | POA: Insufficient documentation

## 2019-05-03 DIAGNOSIS — E282 Polycystic ovarian syndrome: Secondary | ICD-10-CM | POA: Insufficient documentation

## 2019-05-03 DIAGNOSIS — Z20828 Contact with and (suspected) exposure to other viral communicable diseases: Secondary | ICD-10-CM | POA: Insufficient documentation

## 2019-05-03 DIAGNOSIS — F17210 Nicotine dependence, cigarettes, uncomplicated: Secondary | ICD-10-CM | POA: Insufficient documentation

## 2019-05-03 DIAGNOSIS — K529 Noninfective gastroenteritis and colitis, unspecified: Secondary | ICD-10-CM

## 2019-05-03 DIAGNOSIS — F419 Anxiety disorder, unspecified: Secondary | ICD-10-CM | POA: Insufficient documentation

## 2019-05-03 LAB — BASIC METABOLIC PANEL
Anion gap: 10 (ref 5–15)
BUN: 6 mg/dL (ref 6–20)
CO2: 24 mmol/L (ref 22–32)
Calcium: 9.4 mg/dL (ref 8.9–10.3)
Chloride: 105 mmol/L (ref 98–111)
Creatinine, Ser: 0.74 mg/dL (ref 0.44–1.00)
GFR calc Af Amer: >60 mL/min (ref >60)
GFR calc non Af Amer: >60 mL/min (ref >60)
Glucose, Bld: 104 mg/dL — ABNORMAL HIGH (ref 70–99)
Potassium: 3.7 mmol/L (ref 3.5–5.1)
Sodium: 139 mmol/L (ref 135–145)

## 2019-05-03 LAB — CBC
HCT: 46.9 % — ABNORMAL HIGH (ref 36.0–46.0)
Hemoglobin: 15.6 g/dL — ABNORMAL HIGH (ref 12.0–15.0)
MCH: 28.7 pg (ref 26.0–34.0)
MCHC: 33.3 g/dL (ref 30.0–36.0)
MCV: 86.4 fL (ref 80.0–100.0)
Platelets: 478 10*3/uL — ABNORMAL HIGH (ref 150–400)
RBC: 5.43 MIL/uL — ABNORMAL HIGH (ref 3.87–5.11)
RDW: 13.8 % (ref 11.5–15.5)
WBC: 8.3 10*3/uL (ref 4.0–10.5)
nRBC: 0 % (ref 0.0–0.2)

## 2019-05-03 MED ORDER — ONDANSETRON 4 MG PO TBDP: 4.0000 mg | ORAL_TABLET | Freq: Three times a day (TID) | ORAL | 0 refills | Status: DC | PRN

## 2019-05-03 NOTE — ED Triage Notes (Signed)
Pt here with fatigue and nausea x 1 week since having MS treatment; pt sts sore throat and loss of taste

## 2019-05-03 NOTE — Telephone Encounter (Signed)
Copied from [place] [phone]. Topic: Appointment Scheduling - Scheduling Inquiry for Clinic >> May 03, 2019 12:14 PM Celene Kras A wrote: Reason for CRM: Pt called stating she is having pain in her stomach, she vomited this morning, and she's shaking. Please advise.

## 2019-05-03 NOTE — ED Provider Notes (Addendum)
[facility]    CSN: [id number] Arrival date & time: 05/03/19  1446     History   Chief Complaint Chief Complaint  Patient presents with  . Fatigue  . Nausea    HPI Samantha [name] is a 33 y.o. female with a history of MS on ocrevus infusion comes to urgent care with complains of increasing fatigue, nausea, vomiting and diarrhea as well as sore throat and some loss of taste.  Last infusion was about a week ago.  Patient says her oral intake has been poor.  No sick contacts.  No fever or chills.  No dizziness, near syncope or syncopal episodes.  Patient complains of increasing generalized weakness.  Chart review reveals that the patient may have just started on Ocrevus for multiple sclerosis.   HPI  Past Medical History:  Diagnosis Date  . Arthritis   . Chicken pox   . Chlamydia 12/02/2011  . Depression   . Fracture of left leg 2009   removal metal plate in left leg 2016  . Frequent headaches   . Infection    UTI  . Kidney stone    Kidney stone  . Migraines   . Multiple sclerosis (HCC)    receives care @ [facility]  . Neuropathy   . PCOS (polycystic ovarian syndrome)   . Tobacco abuse     Patient Active Problem List   Diagnosis Date Noted  . Prediabetes 12/20/2018  . Dyslipidemia 12/20/2018  . Depression, major, single episode, moderate (HCC) 12/19/2018  . Anxiety 12/19/2018  . MS (multiple sclerosis) (HCC) 12/16/2017  . Nicotine dependence with current use     Past Surgical History:  Procedure Laterality Date  . CESAREAN SECTION N/A 05/12/2013   Procedure: Primary cesarean section with delivery of baby boy at 0754.;  Surgeon: Freddrick MarchKendra H. Tenny Crawoss, MD;  Location: [facility];  Service: Obstetrics;  Laterality: N/A;  . REMOVAL OF IMPLANT Left 09/25/2015   Procedure: LEFT ANKLE REMOVAL OF DEEP  IMPLANT DEBRIDEMENT OF ANTERIOR MEDIAL ANKLE JOINT;  Surgeon: Toni ArthursJohn Hewitt, MD;  Location: [facility];  Service: Orthopedics;  Laterality: Left;  . TIBIA  FRACTURE SURGERY      OB History    Gravida  1   Para  1   Term  1   Preterm      AB      Living  1     SAB      TAB      Ectopic      Multiple      Live Births  1            Home Medications    Prior to Admission medications   Medication Sig Start Date End Date Taking? Authorizing Provider  cholecalciferol (VITAMIN D) 1000 units tablet Take 2,000 Units by mouth daily.     [provider]  fexofenadine (ALLEGRA) 60 MG tablet Take 1 tablet (60 mg total) by mouth 2 (two) times daily. 01/13/19   Wurst, GrenadaBrittany, PA-C  metFORMIN (GLUCOPHAGE XR) 750 MG 24 hr tablet Take 1 tablet (750 mg total) by mouth daily with breakfast. 12/21/18   Ardith DarkParker, Caleb M, MD  ondansetron (ZOFRAN ODT) 4 MG disintegrating tablet Take 1 tablet (4 mg total) by mouth every 8 (eight) hours as needed for nausea or vomiting. 05/03/19   Lamptey, Britta MccreedyPhilip O, MD  varenicline (CHANTIX STARTING MONTH PAK) 0.5 MG X 11 & 1 MG X 42 tablet Take one 0.5 mg tablet by  mouth once daily for 3 days, then increase to one 0.5 mg tablet twice daily for 4 days, then increase to one 1 mg tablet twice daily. 12/19/18   Ardith DarkParker, Caleb M, MD  venlafaxine XR (EFFEXOR-XR) 150 MG 24 hr capsule TAKE 1 CAPSULE (150 MG TOTAL) BY MOUTH DAILY WITH BREAKFAST. 02/06/19   Ardith DarkParker, Caleb M, MD    Family History Family History  Problem Relation Age of Onset  . Hypertension Other   . Diabetes Other   . Ulcers Mother   . GER disease Mother   . Asthma Mother   . Heart attack Mother   . Lupus Mother   . Cancer Father        kidney  . Drug abuse Father   . GER disease Sister   . Asthma Brother   . Birth defects Maternal Grandfather   . Heart attack Maternal Grandfather   . High blood pressure Maternal Grandfather   . Stroke Maternal Grandfather   . Autism Son   . Learning disabilities Son     Social History Social History   Tobacco Use  . Smoking status: Current Every Day Smoker    Packs/day: 0.25    Years: 2.00     Pack years: 0.50    Types: Cigarettes  . Smokeless tobacco: Never Used  . Tobacco comment: quit with preg  Substance Use Topics  . Alcohol use: Not Currently  . Drug use: Yes    Types: Marijuana    Comment: daily use     Allergies   Baclofen and Tizanidine   Review of Systems Review of Systems  Constitutional: Positive for activity change and appetite change. Negative for chills, fatigue and fever.  HENT: Positive for mouth sores and sore throat. Negative for congestion, nosebleeds, postnasal drip, sinus pressure, sinus pain and voice change.   Eyes: Negative.   Respiratory: Negative for cough, chest tightness and wheezing.   Gastrointestinal: Positive for diarrhea, nausea and vomiting. Negative for abdominal pain and constipation.  Genitourinary: Negative for dysuria, frequency and urgency.  Musculoskeletal: Positive for arthralgias and myalgias. Negative for joint swelling.  Skin: Negative for rash and wound.  Allergic/Immunologic: Positive for immunocompromised state.  Neurological: Positive for dizziness, weakness, light-headedness and headaches. Negative for syncope.  Psychiatric/Behavioral: Negative for confusion and decreased concentration.     Physical Exam Triage Vital Signs ED Triage Vitals  Enc Vitals Group     BP 05/03/19 1612 121/88     Pulse Rate 05/03/19 1612 79     Resp 05/03/19 1612 18     Temp 05/03/19 1612 97.6 F (36.4 C)     Temp Source 05/03/19 1612 Oral     SpO2 05/03/19 1612 100 %     Weight --      Height --      Head Circumference --      Peak Flow --      Pain Score 05/03/19 1613 6     Pain Loc --      Pain Edu? --      Excl. in GC? --    No data found.  Updated Vital Signs BP 121/88 (BP Location: Right Arm)   Pulse 79   Temp 97.6 F (36.4 C) (Oral)   Resp 18   SpO2 100%   Visual Acuity Right Eye Distance:   Left Eye Distance:   Bilateral Distance:    Right Eye Near:   Left Eye Near:    Bilateral Near:  Physical Exam  Vitals signs and nursing note reviewed.  Constitutional:      Appearance: She is ill-appearing. She is not toxic-appearing.  HENT:     Right Ear: Tympanic membrane normal.     Left Ear: Tympanic membrane normal.     Nose: No congestion or rhinorrhea.     Mouth/Throat:     Mouth: Mucous membranes are moist.     Pharynx: No posterior oropharyngeal erythema.  Eyes:     Conjunctiva/sclera: Conjunctivae normal.  Cardiovascular:     Rate and Rhythm: Normal rate and regular rhythm.     Pulses: Normal pulses.     Heart sounds: Normal heart sounds.  Pulmonary:     Effort: Pulmonary effort is normal. No respiratory distress.     Breath sounds: Normal breath sounds. No wheezing, rhonchi or rales.  Abdominal:     General: Bowel sounds are normal. There is no distension.     Palpations: Abdomen is soft.     Tenderness: There is no guarding or rebound.  Musculoskeletal: Normal range of motion.        General: No swelling, deformity or signs of injury.  Skin:    General: Skin is warm.     Capillary Refill: Capillary refill takes less than 2 seconds.     Findings: No bruising, erythema or lesion.  Neurological:     General: No focal deficit present.     Mental Status: She is alert and oriented to person, place, and time.  Psychiatric:        Mood and Affect: Mood normal.        Behavior: Behavior normal.      UC Treatments / Results  Labs (all labs ordered are listed, but only abnormal results are displayed) Labs Reviewed  CBC - Abnormal; Notable for the following components:      Result Value   RBC 5.43 (*)    Hemoglobin 15.6 (*)    HCT 46.9 (*)    Platelets 478 (*)    All other components within normal limits  BASIC METABOLIC PANEL - Abnormal; Notable for the following components:   Glucose, Bld 104 (*)    All other components within normal limits  NOVEL CORONAVIRUS, NAA (HOSPITAL ORDER, SEND-OUT TO REF LAB)    EKG   Radiology No results found.  Procedures Procedures  (including critical care time)  Medications Ordered in UC Medications - No data to display  Initial Impression / Assessment and Plan / UC Course  I have reviewed the triage vital signs and the nursing notes.  Pertinent labs & imaging results that were available during my care of the patient were reviewed by me and considered in my medical decision making (see chart for details).     1.  Flulike illness: COVID-19 testing was done Patient is advised to self quarantine till results of COVID-19 test is available Encourage oral fluid intake preferably electrolyte balanced fluids Zofran as needed for nausea and vomiting CBC, BMP CBC was unremarkable BMP was unremarkable  2.  Multiple sclerosis recently started on Ocrevus infusions: If COVID testing is negative, I will attribute the patient's symptoms to side effects of Ocrevus since she just started the infusions a week or so ago.  Review of the side effect profile could explain some of the symptoms that the patient is exhibiting.  Patient was advised to follow-up with neurology sooner than the appointment in January 2021. Final Clinical Impressions(s) / UC Diagnoses   Final diagnoses:  Viral  gastroenteritis   Discharge Instructions   None    ED Prescriptions    Medication Sig Dispense Auth. Provider   ondansetron (ZOFRAN ODT) 4 MG disintegrating tablet Take 1 tablet (4 mg total) by mouth every 8 (eight) hours as needed for nausea or vomiting. 20 tablet Lamptey, Britta MccreedyPhilip O, MD     Controlled Substance Prescriptions [place] Controlled Substance Registry consulted? No   Merrilee JanskyLamptey, Philip O, MD 05/03/19 1814    Merrilee JanskyLamptey, Philip O, MD 05/03/19 [phone]

## 2019-05-03 NOTE — Telephone Encounter (Signed)
Pt is in the ED 

## 2019-05-04 ENCOUNTER — Telehealth (HOSPITAL_COMMUNITY): Payer: Self-pay | Admitting: Emergency Medicine

## 2019-05-04 NOTE — Telephone Encounter (Signed)
Per Dr. Lanny Cramp, no significant abnormalities. Pt has MS and symptoms may be from medication. Per MD, if pt covid test is negative, suggests contacting neurologist about medicines possibly causing symptoms. Patient contacted and made aware of    results, all questions answered

## 2019-05-06 LAB — NOVEL CORONAVIRUS, NAA (HOSP ORDER, SEND-OUT TO REF LAB; TAT 18-24 HRS): SARS-CoV-2, NAA: NOT DETECTED

## 2019-05-07 ENCOUNTER — Encounter (HOSPITAL_COMMUNITY): Payer: Self-pay

## 2019-06-28 ENCOUNTER — Ambulatory Visit (HOSPITAL_COMMUNITY)
Admission: EM | Admit: 2019-06-28 | Discharge: 2019-06-28 | Disposition: A | Discharge status: Home / Self Care | Payer: BC Managed Care – PPO | Attending: Emergency Medicine | Admitting: Emergency Medicine

## 2019-06-28 ENCOUNTER — Encounter (HOSPITAL_COMMUNITY): Payer: Self-pay

## 2019-06-28 ENCOUNTER — Other Ambulatory Visit: Payer: Self-pay | Admitting: Emergency Medicine

## 2019-06-28 ENCOUNTER — Other Ambulatory Visit: Payer: Self-pay

## 2019-06-28 DIAGNOSIS — Z79899 Other long term (current) drug therapy: Secondary | ICD-10-CM | POA: Insufficient documentation

## 2019-06-28 DIAGNOSIS — E282 Polycystic ovarian syndrome: Secondary | ICD-10-CM | POA: Diagnosis not present

## 2019-06-28 DIAGNOSIS — M545 Low back pain, unspecified: Secondary | ICD-10-CM

## 2019-06-28 DIAGNOSIS — Z7984 Long term (current) use of oral hypoglycemic drugs: Secondary | ICD-10-CM | POA: Insufficient documentation

## 2019-06-28 DIAGNOSIS — Z3202 Encounter for pregnancy test, result negative: Secondary | ICD-10-CM

## 2019-06-28 DIAGNOSIS — G35 Multiple sclerosis: Secondary | ICD-10-CM | POA: Insufficient documentation

## 2019-06-28 DIAGNOSIS — Z87442 Personal history of urinary calculi: Secondary | ICD-10-CM | POA: Insufficient documentation

## 2019-06-28 DIAGNOSIS — N342 Other urethritis: Secondary | ICD-10-CM | POA: Insufficient documentation

## 2019-06-28 DIAGNOSIS — F17210 Nicotine dependence, cigarettes, uncomplicated: Secondary | ICD-10-CM | POA: Insufficient documentation

## 2019-06-28 LAB — POCT URINALYSIS DIP (DEVICE)
Bilirubin Urine: NEGATIVE
Glucose, UA: NEGATIVE mg/dL
Hgb urine dipstick: NEGATIVE
Ketones, ur: NEGATIVE mg/dL
Leukocytes,Ua: NEGATIVE
Nitrite: NEGATIVE
Protein, ur: NEGATIVE mg/dL
Specific Gravity, Urine: >=1.03 (ref 1.005–1.030)
Urobilinogen, UA: 1 mg/dL (ref 0.0–1.0)
pH: 6.5 (ref 5.0–8.0)

## 2019-06-28 LAB — POCT PREGNANCY, URINE: Preg Test, Ur: NEGATIVE

## 2019-06-28 MED ORDER — KETOROLAC TROMETHAMINE 30 MG/ML IJ SOLN
INTRAMUSCULAR | Status: AC
  Filled 2019-06-28: qty 1

## 2019-06-28 MED ORDER — KETOROLAC TROMETHAMINE 30 MG/ML IJ SOLN
30.0000 mg | Freq: Once | INTRAMUSCULAR | Status: AC
  Administered 2019-06-28: 30 mg via INTRAMUSCULAR

## 2019-06-28 MED ORDER — ACETAMINOPHEN 325 MG PO TABS
975.0000 mg | ORAL_TABLET | Freq: Once | ORAL | Status: AC
  Administered 2019-06-28: 975 mg via ORAL

## 2019-06-28 MED ORDER — ACETAMINOPHEN 325 MG PO TABS
ORAL_TABLET | ORAL | Status: AC
  Filled 2019-06-28: qty 3

## 2019-06-28 NOTE — ED Triage Notes (Signed)
Pt present lower back pain on her right side with a sharp pain in her vagina that comes and goes but is very persistent.

## 2019-06-28 NOTE — ED Provider Notes (Addendum)
HPI  SUBJECTIVE:  Samantha [name] is a 33 y.o. female who presents with right-sided body aches, nonmigratory, nonradiating low right-sided back pain starting yesterday "around my kidney".  She reports 3 days of intermittent, sharp, shooting, seconds long urethral pain.  No abdominal, pelvic, flank pain.  No dysuria, urgency, frequency, cloudy or odorous urine, hematuria.  No genital rash, labial swelling, itching.  She states that she had a vaginal odor last week, but it resolved.  No vaginal discharge, vomiting, fevers.  No aggravating or alleviating factors.  It is not associated with urinating.  She has not tried anything for this.  She is in a long-term monogamous relationship with a female who is asymptomatic.  STDs or not a concern today, however she states that her partner does have HSV.  Her partner is currently symptom-free.  She denies recent trauma to the back, heavy lifting, lower extremity numbness or tingling, saddle anesthesia, urinary, fecal incontinence, urinary retention.  He says she gets body aches like this when she has MS flares which are usually triggered by infection.  She has a past medical history of MS, UTI, right-sided nonobstructing nephrolithiasis, PCOS, chlamydia, BV.  She continues to smoke.  No history of pyelonephritis, gonorrhea, HIV, HSV, syphilis, trichomonas, yeast infection.  No history of diabetes, bladder spasms.  LMP: July.  Denies the possibility of being pregnant.  AYT:KZSWFU, Algis Greenhouse, MD   Past Medical History:  Diagnosis Date  . Arthritis   . Chicken pox   . Chlamydia 12/02/2011  . Depression   . Fracture of left leg 2009   removal metal plate in left leg 9323  . Frequent headaches   . Infection    UTI  . Kidney stone    Kidney stone  . Migraines   . Multiple sclerosis ([place])    receives care @ [facility]  . Neuropathy   . PCOS (polycystic ovarian syndrome)   . Tobacco abuse     Past Surgical History:  Procedure Laterality Date  .  CESAREAN SECTION N/A 05/12/2013   Procedure: Primary cesarean section with delivery of baby boy at 0754.;  Surgeon: Farrel Gobble. Harrington Challenger, MD;  Location: [facility];  Service: Obstetrics;  Laterality: N/A;  . REMOVAL OF IMPLANT Left 09/25/2015   Procedure: LEFT ANKLE REMOVAL OF DEEP  IMPLANT DEBRIDEMENT OF ANTERIOR MEDIAL ANKLE JOINT;  Surgeon: Wylene Simmer, MD;  Location: [facility];  Service: Orthopedics;  Laterality: Left;  . TIBIA FRACTURE SURGERY      Family History  Problem Relation Age of Onset  . Hypertension Other   . Diabetes Other   . Ulcers Mother   . GER disease Mother   . Asthma Mother   . Heart attack Mother   . Lupus Mother   . Cancer Father        kidney  . Drug abuse Father   . GER disease Sister   . Asthma Brother   . Birth defects Maternal Grandfather   . Heart attack Maternal Grandfather   . High blood pressure Maternal Grandfather   . Stroke Maternal Grandfather   . Autism Son   . Learning disabilities Son     Social History   Tobacco Use  . Smoking status: Current Every Day Smoker    Packs/day: 0.25    Years: 2.00    Pack years: 0.50    Types: Cigarettes  . Smokeless tobacco: Never Used  . Tobacco comment: quit with preg  Substance Use Topics  . Alcohol  use: Not Currently  . Drug use: Yes    Types: Marijuana    Comment: daily use     Current Facility-Administered Medications:  .  acetaminophen (TYLENOL) tablet 975 mg, 975 mg, Oral, Once, Domenick Gong, MD .  ketorolac (TORADOL) 30 MG/ML injection 30 mg, 30 mg, Intramuscular, Once, Domenick Gong, MD  Current Outpatient Medications:  .  cholecalciferol (VITAMIN D) 1000 units tablet, Take 2,000 Units by mouth daily. , Disp: , Rfl:  .  fexofenadine (ALLEGRA) 60 MG tablet, Take 1 tablet (60 mg total) by mouth 2 (two) times daily., Disp: 20 tablet, Rfl: 0 .  metFORMIN (GLUCOPHAGE XR) 750 MG 24 hr tablet, Take 1 tablet (750 mg total) by mouth daily with breakfast., Disp: 90 tablet, Rfl:  0 .  ondansetron (ZOFRAN ODT) 4 MG disintegrating tablet, Take 1 tablet (4 mg total) by mouth every 8 (eight) hours as needed for nausea or vomiting., Disp: 20 tablet, Rfl: 0 .  varenicline (CHANTIX STARTING MONTH PAK) 0.5 MG X 11 & 1 MG X 42 tablet, Take one 0.5 mg tablet by mouth once daily for 3 days, then increase to one 0.5 mg tablet twice daily for 4 days, then increase to one 1 mg tablet twice daily., Disp: 53 tablet, Rfl: 0 .  venlafaxine XR (EFFEXOR-XR) 150 MG 24 hr capsule, TAKE 1 CAPSULE (150 MG TOTAL) BY MOUTH DAILY WITH BREAKFAST., Disp: 90 capsule, Rfl: 1  Allergies  Allergen Reactions  . Baclofen Other (See Comments)    Dizziness, feel foggy, "head feels funny"  . Tizanidine Other (See Comments)    Dizziness, feel foggy, "head feels funny"     ROS  As noted in HPI.   Physical Exam  BP 138/84 (BP Location: Right Arm)   Pulse 72   Temp 98.1 F (36.7 C) (Oral)   Resp 18   SpO2 99%   Constitutional: Well developed, well nourished, no acute distress Eyes:  EOMI, conjunctiva normal bilaterally HENT: Normocephalic, atraumatic,mucus membranes moist Respiratory: Normal inspiratory effort, lungs clear bilaterally. Cardiovascular: Normal rate, regular rhythm, no murmurs rubs or gallops GI: nondistended soft, nontender, no suprapubic or flank tenderness. Back: No C-spine tenderness.  Positive right-sided trapezial tenderness.  Right lateral lower back tenderness.  - paralumbar tenderness, - muscle spasm. No bony tenderness. Bilateral lower extremities nontender, baseline ROM with intact PT pulses, No pain with int/ext rotation extension hips bilaterally. SLR neg bilaterally.  Pain aggravated with right-sided SLR, right hip flexion against resistance.    Sensation baseline light touch bilaterally for Pt, DTR's 1+/1+ but symmetric and intact bilaterally KJ, Motor symmetric bilateral 5/5 hip flexion, quadriceps, hamstrings, EHL, foot dorsiflexion, foot plantarflexion, gait  normal. skin: No rash, skin intact Musculoskeletal: no deformities Neurologic: Alert & oriented x 3, no focal neuro deficits Psychiatric: Speech and behavior appropriate   ED Course   Medications  acetaminophen (TYLENOL) tablet 975 mg (has no administration in time range)  ketorolac (TORADOL) 30 MG/ML injection 30 mg (has no administration in time range)    Orders Placed This Encounter  Procedures  . Urine culture    Standing Status:   Standing    Number of Occurrences:   1    Order Specific Question:   Patient immune status    Answer:   Immunocompromised  . Pregnancy, urine POC    Standing Status:   Standing    Number of Occurrences:   1  . POCT urinalysis dip (device)    Standing Status:   Standing  Number of Occurrences:   1    Results for orders placed or performed during the hospital encounter of 06/28/19 (from the past 24 hour(s))  POCT urinalysis dip (device)     Status: None   Collection Time: 06/28/19  2:21 PM  Result Value Ref Range   Glucose, UA NEGATIVE NEGATIVE mg/dL   Bilirubin Urine NEGATIVE NEGATIVE   Ketones, ur NEGATIVE NEGATIVE mg/dL   Specific Gravity, Urine >=1.030 1.005 - 1.030   Hgb urine dipstick NEGATIVE NEGATIVE   pH 6.5 5.0 - 8.0   Protein, ur NEGATIVE NEGATIVE mg/dL   Urobilinogen, UA 1.0 0.0 - 1.0 mg/dL   Nitrite NEGATIVE NEGATIVE   Leukocytes,Ua NEGATIVE NEGATIVE  Pregnancy, urine POC     Status: None   Collection Time: 06/28/19  2:26 PM  Result Value Ref Range   Preg Test, Ur NEGATIVE NEGATIVE   No results found.  ED Clinical Impression  1. Acute right-sided low back pain without sciatica   2. Urethritis      ED Assessment/Plan  Urine dip negative for UTI.  Sending off urine culture to confirm absence of urinary tract infection.  Patient has tenderness over the lateral right lower back, but no CVAT, flank, suprapubic tenderness.  She has no blood in her urine suggestive of nephrolithiasis.  Suspect that her back pain is  musculoskeletal.  She also has right-sided trapezius pain.  She states that infections frequently trigger of MS flares, so she is concerned that she may have BV or some other infection.  Will send off gonorrhea, chlamydia, trichomonas, BV, yeast.  Patient states she wishes to go to the emergency department to rule out MS flare since we cannot find any obvious source of infection today.  Gave her 30 mg of Toradol IM, 975 mg of Tylenol p.o. here for pain, feel that she is stable to go via private vehicle to the ED.  Discussed labs, MDM, treatment plan, and plan for follow-up with patient. patient agrees with plan.   Meds ordered this encounter  Medications  . acetaminophen (TYLENOL) tablet 975 mg  . ketorolac (TORADOL) 30 MG/ML injection 30 mg    *This clinic note was created using Scientist, clinical (histocompatibility and immunogenetics)Dragon dictation software. Therefore, there may be occasional mistakes despite careful proofreading.   ?    Domenick GongMortenson, Errick Salts, MD 06/28/19 [id number]    Domenick GongMortenson, Wynetta Seith, MD 06/28/19 [phone]

## 2019-06-29 LAB — URINE CULTURE: Culture: NO GROWTH

## 2019-06-29 LAB — CERVICOVAGINAL ANCILLARY ONLY
Bacterial Vaginitis (gardnerella): NEGATIVE
Candida Glabrata: NEGATIVE
Candida Vaginitis: NEGATIVE
Molecular Disclaimer: NEGATIVE
Molecular Disclaimer: NEGATIVE
Molecular Disclaimer: NEGATIVE
Molecular Disclaimer: NORMAL
Trichomonas: NEGATIVE

## 2019-06-30 LAB — CERVICOVAGINAL ANCILLARY ONLY
Chlamydia: NEGATIVE
Neisseria Gonorrhea: NEGATIVE

## 2019-07-04 ENCOUNTER — Other Ambulatory Visit: Payer: Self-pay

## 2019-07-04 ENCOUNTER — Emergency Department (HOSPITAL_COMMUNITY)
Admission: EM | Admit: 2019-07-04 | Discharge: 2019-07-05 | Disposition: A | Discharge status: Home / Self Care | Payer: BC Managed Care – PPO | Attending: Emergency Medicine | Admitting: Emergency Medicine

## 2019-07-04 ENCOUNTER — Encounter (HOSPITAL_COMMUNITY): Payer: Self-pay | Admitting: Emergency Medicine

## 2019-07-04 DIAGNOSIS — G35 Multiple sclerosis: Secondary | ICD-10-CM | POA: Insufficient documentation

## 2019-07-04 DIAGNOSIS — Z79899 Other long term (current) drug therapy: Secondary | ICD-10-CM | POA: Insufficient documentation

## 2019-07-04 DIAGNOSIS — M545 Low back pain: Secondary | ICD-10-CM | POA: Diagnosis present

## 2019-07-04 DIAGNOSIS — F17210 Nicotine dependence, cigarettes, uncomplicated: Secondary | ICD-10-CM | POA: Insufficient documentation

## 2019-07-04 NOTE — ED Triage Notes (Signed)
Patient reports low back pain / generalized body aches onset last week , history of MS , scheduled for MRI next Saturday , denies injury/ambulatory.

## 2019-07-05 ENCOUNTER — Emergency Department (HOSPITAL_COMMUNITY): Payer: BC Managed Care – PPO

## 2019-07-05 LAB — URINALYSIS, ROUTINE W REFLEX MICROSCOPIC
Bilirubin Urine: NEGATIVE
Glucose, UA: NEGATIVE mg/dL
Hgb urine dipstick: NEGATIVE
Ketones, ur: NEGATIVE mg/dL
Nitrite: NEGATIVE
Protein, ur: NEGATIVE mg/dL
Specific Gravity, Urine: 1.023 (ref 1.005–1.030)
pH: 7 (ref 5.0–8.0)

## 2019-07-05 LAB — CBC WITH DIFFERENTIAL/PLATELET
Abs Immature Granulocytes: 0.04 10*3/uL (ref 0.00–0.07)
Basophils Absolute: 0.1 10*3/uL (ref 0.0–0.1)
Basophils Relative: 1 %
Eosinophils Absolute: 0.1 10*3/uL (ref 0.0–0.5)
Eosinophils Relative: 1 %
HCT: 45.3 % (ref 36.0–46.0)
Hemoglobin: 14.3 g/dL (ref 12.0–15.0)
Immature Granulocytes: 0 %
Lymphocytes Relative: 19 %
Lymphs Abs: 1.8 10*3/uL (ref 0.7–4.0)
MCH: 28.2 pg (ref 26.0–34.0)
MCHC: 31.6 g/dL (ref 30.0–36.0)
MCV: 89.3 fL (ref 80.0–100.0)
Monocytes Absolute: 0.7 10*3/uL (ref 0.1–1.0)
Monocytes Relative: 7 %
Neutro Abs: 6.8 10*3/uL (ref 1.7–7.7)
Neutrophils Relative %: 72 %
Platelets: 460 10*3/uL — ABNORMAL HIGH (ref 150–400)
RBC: 5.07 MIL/uL (ref 3.87–5.11)
RDW: 14.4 % (ref 11.5–15.5)
WBC: 9.5 10*3/uL (ref 4.0–10.5)
nRBC: 0 % (ref 0.0–0.2)

## 2019-07-05 LAB — COMPREHENSIVE METABOLIC PANEL
ALT: 17 U/L (ref 0–44)
AST: 15 U/L (ref 15–41)
Albumin: 3.3 g/dL — ABNORMAL LOW (ref 3.5–5.0)
Alkaline Phosphatase: 83 U/L (ref 38–126)
Anion gap: 8 (ref 5–15)
BUN: 9 mg/dL (ref 6–20)
CO2: 26 mmol/L (ref 22–32)
Calcium: 8.8 mg/dL — ABNORMAL LOW (ref 8.9–10.3)
Chloride: 103 mmol/L (ref 98–111)
Creatinine, Ser: 0.77 mg/dL (ref 0.44–1.00)
GFR calc Af Amer: >60 mL/min (ref >60)
GFR calc non Af Amer: >60 mL/min (ref >60)
Glucose, Bld: 93 mg/dL (ref 70–99)
Potassium: 3.9 mmol/L (ref 3.5–5.1)
Sodium: 137 mmol/L (ref 135–145)
Total Bilirubin: 1.1 mg/dL (ref 0.3–1.2)
Total Protein: 6.9 g/dL (ref 6.5–8.1)

## 2019-07-05 LAB — I-STAT BETA HCG BLOOD, ED (MC, WL, AP ONLY): I-stat hCG, quantitative: <5 m[IU]/mL (ref <5)

## 2019-07-05 MED ORDER — GADOBUTROL 1 MMOL/ML IV SOLN
10.0000 mL | Freq: Once | INTRAVENOUS | Status: AC | PRN
  Administered 2019-07-05: 15:00:00 10 mL via INTRAVENOUS

## 2019-07-05 NOTE — ED Notes (Signed)
Spoke with MRI who gave me [facility] number [phone] who stated has the results gave the phone to Dr Billy Fischer. Notified patient doctor getting results now.

## 2019-07-05 NOTE — ED Provider Notes (Signed)
[facility]ENC[facility] Provider Note   CSN: [id number] Arrival date & time: 07/04/19  2316     History   Chief Complaint Chief Complaint  Patient presents with  . Multiple Sclerosis    HPI Samantha [name] is a 33 y.o. female.     Patient is a 33 year old female with past medical history of multiple sclerosis.  This was diagnosed in March 2019 and she is being [facility].  Patient presents here with complaints of pain in her low back, left leg weakness, generalized weakness, body aches, and feeling generally unwell.  This has been ongoing for the past several weeks.  She is due for an MRI in 9 days, however symptoms are progressing to the point she feels that she cannot wait.  She was seen at urgent care several days ago to rule out urinary tract infection.  Her urinalysis at that time was clear.  Patient denies fevers or cough.  She denies any ill contacts.  The history is provided by the patient.    Past Medical History:  Diagnosis Date  . Arthritis   . Chicken pox   . Chlamydia 12/02/2011  . Depression   . Fracture of left leg 2009   removal metal plate in left leg 2016  . Frequent headaches   . Infection    UTI  . Kidney stone    Kidney stone  . Migraines   . Multiple sclerosis (HCC)    receives care @ [facility]  . Neuropathy   . PCOS (polycystic ovarian syndrome)   . Tobacco abuse     Patient Active Problem List   Diagnosis Date Noted  . Prediabetes 12/20/2018  . Dyslipidemia 12/20/2018  . Depression, major, single episode, moderate (HCC) 12/19/2018  . Anxiety 12/19/2018  . MS (multiple sclerosis) (HCC) 12/16/2017  . Nicotine dependence with current use     Past Surgical History:  Procedure Laterality Date  . CESAREAN SECTION N/A 05/12/2013   Procedure: Primary cesarean section with delivery of baby boy at 0754.;  Surgeon: Freddrick MarchKendra H. Tenny Crawoss, MD;  Location: [facility];  Service: Obstetrics;  Laterality:  N/A;  . REMOVAL OF IMPLANT Left 09/25/2015   Procedure: LEFT ANKLE REMOVAL OF DEEP  IMPLANT DEBRIDEMENT OF ANTERIOR MEDIAL ANKLE JOINT;  Surgeon: Toni ArthursJohn Hewitt, MD;  Location: [facility];  Service: Orthopedics;  Laterality: Left;  . TIBIA FRACTURE SURGERY       OB History    Gravida  1   Para  1   Term  1   Preterm      AB      Living  1     SAB      TAB      Ectopic      Multiple      Live Births  1            Home Medications    Prior to Admission medications   Medication Sig Start Date End Date Taking? Authorizing Provider  cholecalciferol (VITAMIN D) 1000 units tablet Take 2,000 Units by mouth daily.     [provider]  fexofenadine (ALLEGRA) 60 MG tablet Take 1 tablet (60 mg total) by mouth 2 (two) times daily. 01/13/19   Wurst, GrenadaBrittany, PA-C  metFORMIN (GLUCOPHAGE XR) 750 MG 24 hr tablet Take 1 tablet (750 mg total) by mouth daily with breakfast. 12/21/18   Ardith DarkParker, Caleb M, MD  ondansetron (ZOFRAN ODT) 4 MG disintegrating  tablet Take 1 tablet (4 mg total) by mouth every 8 (eight) hours as needed for nausea or vomiting. 05/03/19   Lamptey, Britta Mccreedy, MD  varenicline (CHANTIX STARTING MONTH PAK) 0.5 MG X 11 & 1 MG X 42 tablet Take one 0.5 mg tablet by mouth once daily for 3 days, then increase to one 0.5 mg tablet twice daily for 4 days, then increase to one 1 mg tablet twice daily. 12/19/18   Ardith Dark, MD  venlafaxine XR (EFFEXOR-XR) 150 MG 24 hr capsule TAKE 1 CAPSULE (150 MG TOTAL) BY MOUTH DAILY WITH BREAKFAST. 02/06/19   Ardith Dark, MD    Family History Family History  Problem Relation Age of Onset  . Hypertension Other   . Diabetes Other   . Ulcers Mother   . GER disease Mother   . Asthma Mother   . Heart attack Mother   . Lupus Mother   . Cancer Father        kidney  . Drug abuse Father   . GER disease Sister   . Asthma Brother   . Birth defects Maternal Grandfather   . Heart attack Maternal Grandfather   . High  blood pressure Maternal Grandfather   . Stroke Maternal Grandfather   . Autism Son   . Learning disabilities Son     Social History Social History   Tobacco Use  . Smoking status: Current Every Day Smoker    Packs/day: 0.25    Years: 2.00    Pack years: 0.50    Types: Cigarettes  . Smokeless tobacco: Never Used  . Tobacco comment: quit with preg  Substance Use Topics  . Alcohol use: Not Currently  . Drug use: Yes    Types: Marijuana    Comment: daily use     Allergies   Baclofen and Tizanidine   Review of Systems Review of Systems  All other systems reviewed and are negative.    Physical Exam Updated Vital Signs BP 124/80 (BP Location: Right Arm)   Pulse (!) 108   Temp 97.7 F (36.5 C) (Oral)   Resp 16   SpO2 96%   Physical Exam Vitals signs and nursing note reviewed.  Constitutional:      General: She is not in acute distress.    Appearance: She is well-developed. She is not diaphoretic.  HENT:     Head: Normocephalic and atraumatic.     Mouth/Throat:     Mouth: Mucous membranes are moist.     Pharynx: No oropharyngeal exudate or posterior oropharyngeal erythema.  Neck:     Musculoskeletal: Normal range of motion and neck supple.  Cardiovascular:     Rate and Rhythm: Normal rate and regular rhythm.     Heart sounds: No murmur. No friction rub. No gallop.   Pulmonary:     Effort: Pulmonary effort is normal. No respiratory distress.     Breath sounds: Normal breath sounds. No wheezing.  Abdominal:     General: Bowel sounds are normal. There is no distension.     Palpations: Abdomen is soft.     Tenderness: There is no abdominal tenderness.  Musculoskeletal: Normal range of motion.  Skin:    General: Skin is warm and dry.  Neurological:     Mental Status: She is alert and oriented to person, place, and time.     Cranial Nerves: No cranial nerve deficit.     Sensory: No sensory deficit.     Coordination: Coordination normal.  Deep Tendon  Reflexes: Reflexes abnormal.     Comments: Reflexes are difficult to elicit in all 4 extremities.  Strength is 5/5 in all four extremities.       ED Treatments / Results  Labs (all labs ordered are listed, but only abnormal results are displayed) Labs Reviewed  CBC WITH DIFFERENTIAL/PLATELET - Abnormal; Notable for the following components:      Result Value   Platelets 460 (*)    All other components within normal limits  COMPREHENSIVE METABOLIC PANEL - Abnormal; Notable for the following components:   Calcium 8.8 (*)    Albumin 3.3 (*)    All other components within normal limits  URINALYSIS, ROUTINE W REFLEX MICROSCOPIC - Abnormal; Notable for the following components:   APPearance HAZY (*)    Leukocytes,Ua SMALL (*)    Bacteria, UA RARE (*)    All other components within normal limits  I-STAT BETA HCG BLOOD, ED ([facility], [facility], [facility] ONLY)    EKG None  Radiology No results found.  Procedures Procedures (including critical care time)  Medications Ordered in ED Medications - No data to display   Initial Impression / Assessment and Plan / ED Course  I have reviewed the triage vital signs and the nursing notes.  Pertinent labs & imaging results that were available during my care of the patient were reviewed by me and considered in my medical decision making (see chart for details).  Patient presenting with complaints of weakness in her right leg, low back discomfort, and weakness and difficulty controlling her arms.  Patient has history of MS and is concerned this may be a flare.  She was due to have an MRI in the near future, however wanted symptoms evaluated sooner.  Patient to undergo MRI studies of her brain and spine.  These studies are currently pending.  Care will be signed out to Dr. Billy Fischer at shift change.  She will obtain the results of the studies and determine the final disposition.  Final Clinical Impressions(s) / ED Diagnoses   Final diagnoses:  None    ED  Discharge Orders    None       Veryl Speak, MD 07/06/19 [phone]

## 2019-07-29 ENCOUNTER — Ambulatory Visit (HOSPITAL_COMMUNITY)
Admission: EM | Admit: 2019-07-29 | Discharge: 2019-07-29 | Disposition: A | Discharge status: Home / Self Care | Payer: BC Managed Care – PPO | Attending: Emergency Medicine | Admitting: Emergency Medicine

## 2019-07-29 ENCOUNTER — Encounter (HOSPITAL_COMMUNITY): Payer: Self-pay

## 2019-07-29 ENCOUNTER — Other Ambulatory Visit: Payer: Self-pay

## 2019-07-29 DIAGNOSIS — H0011 Chalazion right upper eyelid: Secondary | ICD-10-CM

## 2019-07-29 MED ORDER — ERYTHROMYCIN 5 MG/GM OP OINT: TOPICAL_OINTMENT | OPHTHALMIC | 0 refills | Status: DC

## 2019-07-29 MED ORDER — TETRACAINE HCL 0.5 % OP SOLN
1.0000 [drp] | Freq: Once | OPHTHALMIC | Status: AC
  Administered 2019-07-29: 11:00:00 1 [drp] via OPHTHALMIC

## 2019-07-29 NOTE — ED Provider Notes (Signed)
HPI  SUBJECTIVE:  Samantha [name] is a 33 y.o. female who presents with painful swelling, erythema of the right upper eyelid for the past 6 days.  She reports constant foggy vision yesterday, but states that this has resolved.  She describes the eye pain as dull, constant and occasionally becoming sharp.  She reports burning eye pain, photophobia, increased tearing.  She states her eye is matted shut in the morning.  No headaches, erythema, edema around the eye.  No pain with EOMs, visual loss.  She tried an over-the-counter stye medication, warm compresses and ibuprofen.  The stye cream and warm compresses make the swelling worse, no alleviating factors.  She has a past medical history of MS, PCOS.  She wears glasses.  She does not wear contacts.  LMP: Last month.  Denies the possibility being pregnant.  PMD: Dr. Dimas Chyle.  Ophthalmology: Dr. Sabra Heck at [place].    Past Medical History:  Diagnosis Date  . Arthritis   . Chicken pox   . Chlamydia 12/02/2011  . Depression   . Fracture of left leg 2009   removal metal plate in left leg 9798  . Frequent headaches   . Infection    UTI  . Kidney stone    Kidney stone  . Migraines   . Multiple sclerosis ([place])    receives care @ [facility]  . Neuropathy   . PCOS (polycystic ovarian syndrome)   . Tobacco abuse     Past Surgical History:  Procedure Laterality Date  . CESAREAN SECTION N/A 05/12/2013   Procedure: Primary cesarean section with delivery of baby boy at 0754.;  Surgeon: Farrel Gobble. Harrington Challenger, MD;  Location: [facility];  Service: Obstetrics;  Laterality: N/A;  . REMOVAL OF IMPLANT Left 09/25/2015   Procedure: LEFT ANKLE REMOVAL OF DEEP  IMPLANT DEBRIDEMENT OF ANTERIOR MEDIAL ANKLE JOINT;  Surgeon: Wylene Simmer, MD;  Location: [facility];  Service: Orthopedics;  Laterality: Left;  . TIBIA FRACTURE SURGERY      Family History  Problem Relation Age of Onset  . Hypertension Other   . Diabetes Other   . Ulcers Mother    . GER disease Mother   . Asthma Mother   . Heart attack Mother   . Lupus Mother   . Cancer Father        kidney  . Drug abuse Father   . GER disease Sister   . Asthma Brother   . Birth defects Maternal Grandfather   . Heart attack Maternal Grandfather   . High blood pressure Maternal Grandfather   . Stroke Maternal Grandfather   . Autism Son   . Learning disabilities Son     Social History   Tobacco Use  . Smoking status: Current Every Day Smoker    Packs/day: 0.25    Years: 2.00    Pack years: 0.50    Types: Cigarettes  . Smokeless tobacco: Never Used  . Tobacco comment: quit with preg  Substance Use Topics  . Alcohol use: Not Currently  . Drug use: Yes    Types: Marijuana    Comment: daily use    No current facility-administered medications for this encounter.   Current Outpatient Medications:  .  cholecalciferol (VITAMIN D) 1000 units tablet, Take 2,000 Units by mouth daily. , Disp: , Rfl:  .  gabapentin (NEURONTIN) 300 MG capsule, TAKE 1 CAPSULE AT BEDTIME FOR 5 DAYS, IF TOLERATED INCREASE TO 1 CAPSULE TWICE A DAY., Disp: , Rfl:  .  erythromycin ophthalmic ointment, 1 cm ribbon to affected eyelid qid x 10 days, Disp: 5 g, Rfl: 0 .  fexofenadine (ALLEGRA) 60 MG tablet, Take 1 tablet (60 mg total) by mouth 2 (two) times daily., Disp: 20 tablet, Rfl: 0 .  metFORMIN (GLUCOPHAGE XR) 750 MG 24 hr tablet, Take 1 tablet (750 mg total) by mouth daily with breakfast., Disp: 90 tablet, Rfl: 0 .  ondansetron (ZOFRAN ODT) 4 MG disintegrating tablet, Take 1 tablet (4 mg total) by mouth every 8 (eight) hours as needed for nausea or vomiting., Disp: 20 tablet, Rfl: 0 .  varenicline (CHANTIX STARTING MONTH PAK) 0.5 MG X 11 & 1 MG X 42 tablet, Take one 0.5 mg tablet by mouth once daily for 3 days, then increase to one 0.5 mg tablet twice daily for 4 days, then increase to one 1 mg tablet twice daily., Disp: 53 tablet, Rfl: 0 .  venlafaxine XR (EFFEXOR-XR) 150 MG 24 hr capsule, TAKE 1  CAPSULE (150 MG TOTAL) BY MOUTH DAILY WITH BREAKFAST., Disp: 90 capsule, Rfl: 1  Allergies  Allergen Reactions  . Baclofen Other (See Comments)    Dizziness, feel foggy, "head feels funny"  . Tizanidine Other (See Comments)    Dizziness, feel foggy, "head feels funny"     ROS  As noted in HPI.   Physical Exam  BP 130/86 (BP Location: Right Arm)   Pulse 88   Temp 98.5 F (36.9 C) (Oral)   Resp 18   SpO2 100%   Constitutional: Well developed, well nourished, no acute distress Eyes:  EOMI, conjunctiva normal bilaterally.  PERRLA.  No pain with EOMs.  No direct or consensual photophobia.  No periorbital tenderness, erythema, edema.  Positive right upper lid erythema, edema.  No obvious pointing.  No abrasion seen on fluorescein exam.  See picture.     Visual acuity: Left 20/20, right 20/40 patient states that it is normally uneven  HENT: Normocephalic, atraumatic,mucus membranes moist Respiratory: Normal inspiratory effort Cardiovascular: Normal rate GI: nondistended skin: No rash, skin intact Musculoskeletal: no deformities Neurologic: Alert & oriented x 3, no focal neuro deficits Psychiatric: Speech and behavior appropriate   ED Course   Medications  tetracaine (PONTOCAINE) 0.5 % ophthalmic solution 1-2 drop (1 drop Right Eye Given by Other 07/29/19 1116)    Orders Placed This Encounter  Procedures  . Visual acuity screening    Standing Status:   Standing    Number of Occurrences:   1    No results found for this or any previous visit (from the past 24 hour(s)). No results found.  ED Clinical Impression  1. Chalazion of right upper eyelid      ED Assessment/Plan  Patient with chalazion.  Home with warm compresses, lid hygiene, erythromycin eye ointment.  Follow-up with her ophthalmologist in 5 days if not getting any better.  No evidence of periorbital/preseptal/postseptal cellulitis  Discussed MDM, treatment plan, and plan for follow-up with patient.   patient agrees with plan.   Meds ordered this encounter  Medications  . tetracaine (PONTOCAINE) 0.5 % ophthalmic solution 1-2 drop  . erythromycin ophthalmic ointment    Sig: 1 cm ribbon to affected eyelid qid x 10 days    Dispense:  5 g    Refill:  0    *This clinic note was created using Scientist, clinical (histocompatibility and immunogenetics). Therefore, there may be occasional mistakes despite careful proofreading.   ?    Domenick Gong, MD 07/29/19 1212

## 2019-07-29 NOTE — ED Triage Notes (Signed)
Patient presents to Urgent Care with complaints of swelling and drainage on her right eye since 6 days ago. Patient reports the swelling is beginning to impair her vision.

## 2019-07-29 NOTE — Discharge Instructions (Addendum)
Start doing warm compresses 4 times a day. You may get some baby shampoo, dilute this with warm water, and gently wipe your upper and lower eyelid while you are taking a shower. This will help prevent the recurrence of any styes or chalazions. Follow up with ophthalmologist if this does not get better within several days. Try not to rub your eyes. You may use Systane as often as you want for comfort. Return immediately to the ER for fever above 100.4, if you have pain moving your eyes, any visual changes, nausea, vomiting, headaches, a rash around your eye, or any other concerns. ° °Go to www.goodrx.com to look up your medications. This will give you a list of where you can find your prescriptions at the most affordable prices. Or ask the pharmacist what the cash price is, or if they have any other discount programs available to help make your medication more affordable. This can be less expensive than what you would pay with insurance.   °

## 2019-12-10 ENCOUNTER — Telehealth: Payer: Self-pay | Admitting: Family Medicine

## 2019-12-10 NOTE — Telephone Encounter (Signed)
Patient has a CPE on the 23rd but would like to be seen in the office before.  She has no appetite and believes she has lost close to 50 pounds in the last year.  Patient needs to be seen between 9am and 1pm due to having to pick her son up from school.  Please advise.

## 2019-12-11 NOTE — Telephone Encounter (Signed)
Notified patient to call to schedule appt. for weight loss and poor appetite.

## 2019-12-25 ENCOUNTER — Encounter: Payer: Self-pay | Admitting: Family Medicine

## 2019-12-25 ENCOUNTER — Ambulatory Visit (INDEPENDENT_AMBULATORY_CARE_PROVIDER_SITE_OTHER): Payer: BC Managed Care – PPO | Admitting: Family Medicine

## 2019-12-25 ENCOUNTER — Other Ambulatory Visit: Payer: Self-pay

## 2019-12-25 VITALS — BP 118/78 | HR 68 | Temp 97.9°F | Ht 65.0 in | Wt 221.5 lb

## 2019-12-25 DIAGNOSIS — G35 Multiple sclerosis: Secondary | ICD-10-CM | POA: Diagnosis not present

## 2019-12-25 DIAGNOSIS — R7303 Prediabetes: Secondary | ICD-10-CM

## 2019-12-25 DIAGNOSIS — E785 Hyperlipidemia, unspecified: Secondary | ICD-10-CM | POA: Diagnosis not present

## 2019-12-25 DIAGNOSIS — Z6836 Body mass index (BMI) 36.0-36.9, adult: Secondary | ICD-10-CM

## 2019-12-25 DIAGNOSIS — Z0001 Encounter for general adult medical examination with abnormal findings: Secondary | ICD-10-CM

## 2019-12-25 DIAGNOSIS — F419 Anxiety disorder, unspecified: Secondary | ICD-10-CM

## 2019-12-25 DIAGNOSIS — F17200 Nicotine dependence, unspecified, uncomplicated: Secondary | ICD-10-CM

## 2019-12-25 DIAGNOSIS — E669 Obesity, unspecified: Secondary | ICD-10-CM

## 2019-12-25 DIAGNOSIS — F321 Major depressive disorder, single episode, moderate: Secondary | ICD-10-CM

## 2019-12-25 LAB — LIPID PANEL
Cholesterol: 168 mg/dL (ref 0–200)
HDL: 36.2 mg/dL — ABNORMAL LOW (ref >39.00)
LDL Cholesterol: 120 mg/dL — ABNORMAL HIGH (ref 0–99)
NonHDL: 132.16
Total CHOL/HDL Ratio: 5
Triglycerides: 63 mg/dL (ref 0.0–149.0)
VLDL: 12.6 mg/dL (ref 0.0–40.0)

## 2019-12-25 LAB — CBC
HCT: 43.3 % (ref 36.0–46.0)
Hemoglobin: 13.9 g/dL (ref 12.0–15.0)
MCHC: 32.1 g/dL (ref 30.0–36.0)
MCV: 87.2 fl (ref 78.0–100.0)
Platelets: 423 10*3/uL — ABNORMAL HIGH (ref 150.0–400.0)
RBC: 4.97 Mil/uL (ref 3.87–5.11)
RDW: 14.9 % (ref 11.5–15.5)
WBC: 5 10*3/uL (ref 4.0–10.5)

## 2019-12-25 LAB — COMPREHENSIVE METABOLIC PANEL
ALT: 7 U/L (ref 0–35)
AST: 11 U/L (ref 0–37)
Albumin: 4.2 g/dL (ref 3.5–5.2)
Alkaline Phosphatase: 82 U/L (ref 39–117)
BUN: 10 mg/dL (ref 6–23)
CO2: 29 mEq/L (ref 19–32)
Calcium: 9.6 mg/dL (ref 8.4–10.5)
Chloride: 106 mEq/L (ref 96–112)
Creatinine, Ser: 0.77 mg/dL (ref 0.40–1.20)
GFR: 104.42 mL/min (ref >60.00)
Glucose, Bld: 91 mg/dL (ref 70–99)
Potassium: 3.9 mEq/L (ref 3.5–5.1)
Sodium: 141 mEq/L (ref 135–145)
Total Bilirubin: 0.7 mg/dL (ref 0.2–1.2)
Total Protein: 7 g/dL (ref 6.0–8.3)

## 2019-12-25 LAB — HEMOGLOBIN A1C: Hgb A1c MFr Bld: 6.1 % (ref 4.6–6.5)

## 2019-12-25 LAB — TSH: TSH: 0.62 u[IU]/mL (ref 0.35–4.50)

## 2019-12-25 MED ORDER — BUPROPION HCL ER (XL) 150 MG PO TB24: 150.0000 mg | ORAL_TABLET | Freq: Every day | ORAL | 5 refills | Status: DC

## 2019-12-25 NOTE — Patient Instructions (Signed)
It was very nice to see you today!  Please start the Wellbutrin.  Take 1 pill daily for the next 1 to 2 weeks, then increase to 2 pills daily.  If this makes her anxiety worse, please let me know.  We will check blood work today.  Come back in 1 year for your next physical, or sooner if needed.  Take care, Dr   Please try these tips to maintain a healthy lifestyle:   Eat at least 3 REAL meals and 1-2 snacks per day.  Aim for no more than 5 hours between eating.  If you eat breakfast, please do so within one hour of getting up.    Each meal should contain half fruits/vegetables, one quarter protein, and one quarter carbs (no bigger than a computer mouse)   Cut down on sweet beverages. This includes juice, soda, and sweet tea.     Drink at least 1 glass of water with each meal and aim for at least 8 glasses per day   Exercise at least 150 minutes every week.    Preventive Care 21-39 Years Old, Female Preventive care refers to visits with your health care provider and lifestyle choices that can promote health and wellness. This includes:  A yearly physical exam. This may also be called an annual well check.  Regular dental visits and eye exams.  Immunizations.  Screening for certain conditions.  Healthy lifestyle choices, such as eating a healthy diet, getting regular exercise, not using drugs or products that contain nicotine and tobacco, and limiting alcohol use. What can I expect for my preventive care visit? Physical exam Your health care provider will check your:  Height and weight. This may be used to calculate body mass index (BMI), which tells if you are at a healthy weight.  Heart rate and blood pressure.  Skin for abnormal spots. Counseling Your health care provider may ask you questions about your:  Alcohol, tobacco, and drug use.  Emotional well-being.  Home and relationship well-being.  Sexual activity.  Eating habits.  Work and work  environment.  Method of birth control.  Menstrual cycle.  Pregnancy history. What immunizations do I need?  Influenza (flu) vaccine  This is recommended every year. Tetanus, diphtheria, and pertussis (Tdap) vaccine  You may need a Td booster every 10 years. Varicella (chickenpox) vaccine  You may need this if you have not been vaccinated. Human papillomavirus (HPV) vaccine  If recommended by your health care provider, you may need three doses over 6 months. Measles, mumps, and rubella (MMR) vaccine  You may need at least one dose of MMR. You may also need a second dose. Meningococcal conjugate (MenACWY) vaccine  One dose is recommended if you are age 19-21 years and a first-year college student living in a residence hall, or if you have one of several medical conditions. You may also need additional booster doses. Pneumococcal conjugate (PCV13) vaccine  You may need this if you have certain conditions and were not previously vaccinated. Pneumococcal polysaccharide (PPSV23) vaccine  You may need one or two doses if you smoke cigarettes or if you have certain conditions. Hepatitis A vaccine  You may need this if you have certain conditions or if you travel or work in places where you may be exposed to hepatitis A. Hepatitis B vaccine  You may need this if you have certain conditions or if you travel or work in places where you may be exposed to hepatitis B. Haemophilus influenzae type b (  Hib) vaccine  You may need this if you have certain conditions. You may receive vaccines as individual doses or as more than one vaccine together in one shot (combination vaccines). Talk with your health care provider about the risks and benefits of combination vaccines. What tests do I need?  Blood tests  Lipid and cholesterol levels. These may be checked every 5 years starting at age 20.  Hepatitis C test.  Hepatitis B test. Screening  Diabetes screening. This is done by checking  your blood sugar (glucose) after you have not eaten for a while (fasting).  Sexually transmitted disease (STD) testing.  BRCA-related cancer screening. This may be done if you have a family history of breast, ovarian, tubal, or peritoneal cancers.  Pelvic exam and Pap test. This may be done every 3 years starting at age 21. Starting at age 30, this may be done every 5 years if you have a Pap test in combination with an HPV test. Talk with your health care provider about your test results, treatment options, and if necessary, the need for more tests. Follow these instructions at home: Eating and drinking   Eat a diet that includes fresh fruits and vegetables, whole grains, lean protein, and low-fat dairy.  Take vitamin and mineral supplements as recommended by your health care provider.  Do not drink alcohol if: ? Your health care provider tells you not to drink. ? You are pregnant, may be pregnant, or are planning to become pregnant.  If you drink alcohol: ? Limit how much you have to 0-1 drink a day. ? Be aware of how much alcohol is in your drink. In the [place] one drink equals one 12 oz bottle of beer (355 mL), one 5 oz glass of wine (148 mL), or one 1 oz glass of hard liquor (44 mL). Lifestyle  Take daily care of your teeth and gums.  Stay active. Exercise for at least 30 minutes on 5 or more days each week.  Do not use any products that contain nicotine or tobacco, such as cigarettes, e-cigarettes, and chewing tobacco. If you need help quitting, ask your health care provider.  If you are sexually active, practice safe sex. Use a condom or other form of birth control (contraception) in order to prevent pregnancy and STIs (sexually transmitted infections). If you plan to become pregnant, see your health care provider for a preconception visit. What's next?  Visit your health care provider once a year for a well check visit.  Ask your health care provider how often you should  have your eyes and teeth checked.  Stay up to date on all vaccines. This information is not intended to replace advice given to you by your health care provider. Make sure you discuss any questions you have with your health care provider. Document Revised: 06/01/2018 Document Reviewed: 06/01/2018 Elsevier Patient Education  2020 [employer]  

## 2019-12-25 NOTE — Progress Notes (Signed)
Chief Complaint:  Samantha [name] is a 34 y.o. female who presents today for her annual comprehensive physical exam.    Assessment/Plan:  Chronic Problems Addressed Today: Dyslipidemia Check lipid panel, CBC, C met, TSH.  Prediabetes Check A1c.  Continue lifestyle modifications.  Anxiety Does not want any medications for anxiety.  We will continue seeing her therapist.  Depression, major, single episode, moderate ([place]) And elevated PHQ today.  Will start Wellbutrin for smoking cessation which should hopefully help some with depression as well.  MS (multiple sclerosis) ([place]) Continue management per neurology.  Nicotine dependence with current use Patient was asked about her tobacco use today and was strongly advised to quit. Patient is currently trying to quit. We reviewed treatment options to assist her quit smoking including NRT, Chantix, and Bupropion.  Will start Wellbutrin 150 mg daily.  Discussed potential side effects and possibility that it may make her anxiety worse.  Follow up at next office visit.   Total time spent counseling approximately 3 minutes.     Body mass index is 36.86 kg/m. / Obese BMI Metric Follow Up - 12/25/19 0956      BMI Metric Follow Up-Please document annually   BMI Metric Follow Up  Education provided       Preventative Healthcare: Check CBC, C met, TSH, lipid panel.  Follows with GYN for Pap smears.  Patient Counseling(The following topics were reviewed and/or handout was given):  -Nutrition: Stressed importance of moderation in sodium/caffeine intake, saturated fat and cholesterol, caloric balance, sufficient intake of fresh fruits, vegetables, and fiber.  -Stressed the importance of regular exercise.   -Substance Abuse: Discussed cessation/primary prevention of tobacco, alcohol, or other drug use; driving or other dangerous activities under the influence; availability of treatment for abuse.   -Injury prevention: Discussed safety  belts, safety helmets, smoke detector, smoking near bedding or upholstery.   -Sexuality: Discussed sexually transmitted diseases, partner selection, use of condoms, avoidance of unintended pregnancy and contraceptive alternatives.   -Dental health: Discussed importance of regular tooth brushing, flossing, and dental visits.  -Health maintenance and immunizations reviewed. Please refer to Health maintenance section.  Return to care in 1 year for next preventative visit.     Subjective:  HPI:  She has no acute complaints today.   She has been having more stress over the past year.  This is due to to several personal stressors including stressful living situation and recent divorce.  Covid pandemic is also been increasing her stress.  She has been working with a therapist which seems to be helping.  She has been on Cymbalta and Effexor in the past with no improvement.  Lifestyle Diet: None specific.  Exercise: None.   Depression screen PHQ 2/9 12/19/2018  Decreased Interest 3  Down, Depressed, Hopeless 3  PHQ - 2 Score 6  Altered sleeping 2  Tired, decreased energy 2  Change in appetite 3  Feeling bad or failure about yourself  3  Trouble concentrating 3  Moving slowly or fidgety/restless 3  Suicidal thoughts 3  PHQ-9 Score 25  Difficult doing work/chores Extremely dIfficult    There are no preventive care reminders to display for this patient.   ROS: Per HPI, otherwise a complete review of systems was negative.   PMH:  The following were reviewed and entered/updated in epic: Past Medical History:  Diagnosis Date  . Arthritis   . Chicken pox   . Chlamydia 12/02/2011  . Depression   . Fracture of left leg  2009   removal metal plate in left leg 8366  . Frequent headaches   . Infection    UTI  . Kidney stone    Kidney stone  . Migraines   . Multiple sclerosis ([place])    receives care @ [facility]  . Neuropathy   . PCOS (polycystic ovarian syndrome)   . Tobacco abuse     Patient Active Problem List   Diagnosis Date Noted  . Prediabetes 12/20/2018  . Dyslipidemia 12/20/2018  . Depression, major, single episode, moderate ([place]) 12/19/2018  . Anxiety 12/19/2018  . MS (multiple sclerosis) ([place]) 12/16/2017  . Nicotine dependence with current use    Past Surgical History:  Procedure Laterality Date  . CESAREAN SECTION N/A 05/12/2013   Procedure: Primary cesarean section with delivery of baby boy at 0754.;  Surgeon: Farrel Gobble. Harrington Challenger, MD;  Location: [facility];  Service: Obstetrics;  Laterality: N/A;  . REMOVAL OF IMPLANT Left 09/25/2015   Procedure: LEFT ANKLE REMOVAL OF DEEP  IMPLANT DEBRIDEMENT OF ANTERIOR MEDIAL ANKLE JOINT;  Surgeon: Wylene Simmer, MD;  Location: [facility];  Service: Orthopedics;  Laterality: Left;  . TIBIA FRACTURE SURGERY      Family History  Problem Relation Age of Onset  . Hypertension Other   . Diabetes Other   . Ulcers Mother   . GER disease Mother   . Asthma Mother   . Heart attack Mother   . Lupus Mother   . Cancer Father        kidney  . Drug abuse Father   . GER disease Sister   . Asthma Brother   . Birth defects Maternal Grandfather   . Heart attack Maternal Grandfather   . High blood pressure Maternal Grandfather   . Stroke Maternal Grandfather   . Autism Son   . Learning disabilities Son     Medications- reviewed and updated Current Outpatient Medications  Medication Sig Dispense Refill  . cholecalciferol (VITAMIN D) 1000 units tablet Take 2,000 Units by mouth daily.     . fexofenadine (ALLEGRA) 60 MG tablet Take 1 tablet (60 mg total) by mouth 2 (two) times daily. 20 tablet 0  . gabapentin (NEURONTIN) 300 MG capsule TAKE 1 CAPSULE AT BEDTIME FOR 5 DAYS, IF TOLERATED INCREASE TO 1 CAPSULE TWICE A DAY.    Marland Kitchen ocrelizumab (OCREVUS) 300 MG/10ML injection Inject 600 mg into the vein every 6 (six) months.     Marland Kitchen buPROPion (WELLBUTRIN XL) 150 MG 24 hr tablet Take 1 tablet (150 mg total) by mouth daily. 30  tablet 5   No current facility-administered medications for this visit.    Allergies-reviewed and updated Allergies  Allergen Reactions  . Baclofen Other (See Comments)    Dizziness, feel foggy, "head feels funny"  . Tizanidine Other (See Comments)    Dizziness, feel foggy, "head feels funny"    Social History   Socioeconomic History  . Marital status: Legally Separated    Spouse name: Not on file  . Number of children: Not on file  . Years of education: Not on file  . Highest education level: Not on file  Occupational History  . Not on file  Tobacco Use  . Smoking status: Current Every Day Smoker    Packs/day: 0.25    Years: 2.00    Pack years: 0.50    Types: Cigarettes  . Smokeless tobacco: Never Used  . Tobacco comment: quit with preg  Substance and Sexual Activity  . Alcohol use: Not  Currently  . Drug use: Yes    Types: Marijuana    Comment: daily use  . Sexual activity: Yes    Birth control/protection: None  Other Topics Concern  . Not on file  Social History Narrative   Married, husband is truck Geophysicist/field seismologist.  Lives with husband, son and sister.  Education some college.  Children 1.  Caffeine 2-3 sodas daily.    has a 66-year-old that is autistic.    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 Difficulty of Paying Living Expenses:   Food Insecurity:   . Worried About Charity fundraiser in the Last Year:   . Arboriculturist in the Last Year:   Transportation Needs:   . Film/video editor (Medical):   Marland Kitchen Lack of Transportation (Non-Medical):   Physical Activity:   . Days of Exercise per Week:   . Minutes of Exercise per Session:   Stress:   . Feeling of Stress :   Social Connections:   . Frequency of Communication with Friends and Family:   . Frequency of Social Gatherings with Friends and Family:   . Attends Religious Services:   . Active Member of Clubs or  Organizations:   . Attends Archivist Meetings:   Marland Kitchen Marital Status:         Objective:  Physical Exam: BP 118/78   Pulse 68   Temp 97.9 F (36.6 C)   Ht '5\' 5"'$  (1.651 m)   Wt 221 lb 8 oz (100.5 kg)   LMP 12/07/2019   SpO2 98%   BMI 36.86 kg/m   Body mass index is 36.86 kg/m. Wt Readings from Last 3 Encounters:  12/25/19 221 lb 8 oz (100.5 kg)  12/19/18 237 lb 3.2 oz (107.6 kg)  10/06/18 243 lb (110.2 kg)   Gen: NAD, resting comfortably HEENT: TMs normal bilaterally. OP clear. No thyromegaly noted.  CV: RRR with no murmurs appreciated Pulm: NWOB, CTAB with no crackles, wheezes, or rhonchi GI: Normal bowel sounds present. Soft, Nontender, Nondistended. MSK: no edema, cyanosis, or clubbing noted Skin: warm, dry Neuro: CN2-12 grossly intact. Strength 5/5 in upper and lower extremities. Reflexes symmetric and intact bilaterally.  Psych: Normal affect and thought content     Zakya Halabi M. Jerline Pain, MD 12/25/2019 10:00 AM

## 2019-12-25 NOTE — Assessment & Plan Note (Signed)
Does not want any medications for anxiety.  We will continue seeing her therapist.

## 2019-12-25 NOTE — Assessment & Plan Note (Signed)
Patient was asked about her tobacco use today and was strongly advised to quit. Patient is currently trying to quit. We reviewed treatment options to assist her quit smoking including NRT, Chantix, and Bupropion.  Will start Wellbutrin 150 mg daily.  Discussed potential side effects and possibility that it may make her anxiety worse.  Follow up at next office visit.   Total time spent counseling approximately 3 minutes.

## 2019-12-25 NOTE — Assessment & Plan Note (Signed)
Continue management per neurology. 

## 2019-12-25 NOTE — Assessment & Plan Note (Signed)
Check lipid panel, CBC, C met, TSH.

## 2019-12-25 NOTE — Assessment & Plan Note (Signed)
Check A1c.  Continue lifestyle modifications. 

## 2019-12-25 NOTE — Assessment & Plan Note (Signed)
And elevated PHQ today.  Will start Wellbutrin for smoking cessation which should hopefully help some with depression as well.

## 2019-12-26 NOTE — Progress Notes (Signed)
Please inform patient of the following:  Blood work is all STABLE. Cholesterol and blood work much better than last year. Would like for her to keep up the good work and we can recheck in a year or so.  Katina Degree. [name], MD 12/26/2019 8:05 AM

## 2020-01-16 ENCOUNTER — Other Ambulatory Visit: Payer: Self-pay | Admitting: Family Medicine

## 2020-01-16 ENCOUNTER — Other Ambulatory Visit: Payer: Self-pay

## 2020-01-21 ENCOUNTER — Telehealth: Payer: Self-pay | Admitting: Family Medicine

## 2020-01-21 ENCOUNTER — Other Ambulatory Visit: Payer: Self-pay

## 2020-01-21 DIAGNOSIS — Z20828 Contact with and (suspected) exposure to other viral communicable diseases: Secondary | ICD-10-CM

## 2020-01-21 NOTE — Telephone Encounter (Signed)
Spoke with patient she was exposed to Herpes 2 weeks ago,she is not having any outbreaks or symptoms. Requesting HSV testing.

## 2020-01-21 NOTE — Telephone Encounter (Signed)
Pt called asking if Dr. Jimmey [name] could put in orders for her to get STD tested. Pt did not want to make an appt. Please advise.

## 2020-01-21 NOTE — Telephone Encounter (Signed)
Patient notified,lab placed

## 2020-01-21 NOTE — Telephone Encounter (Signed)
Ok for her to come in for herpes testing, but would recommend waiting 4-6 weeks after exposure because there can be false negatives if tested too early.  Samantha [name]. [name], MD 01/21/2020 10:00 AM

## 2020-02-25 ENCOUNTER — Other Ambulatory Visit: Payer: Self-pay

## 2020-02-25 ENCOUNTER — Other Ambulatory Visit: Payer: BC Managed Care – PPO

## 2020-02-25 DIAGNOSIS — Z20828 Contact with and (suspected) exposure to other viral communicable diseases: Secondary | ICD-10-CM

## 2020-02-26 LAB — HSV(HERPES SIMPLEX VRS) I + II AB-IGG
HAV 1 IGG,TYPE SPECIFIC AB: <0.9 index
HSV 2 IGG,TYPE SPECIFIC AB: <0.9 index

## 2020-02-26 NOTE — Progress Notes (Signed)
Please inform patient of the following:  HSV test is negative - she does not have herpes.  Katina Degree. [name], MD 02/26/2020 12:54 PM

## 2020-07-07 ENCOUNTER — Ambulatory Visit (HOSPITAL_COMMUNITY)
Admission: EM | Admit: 2020-07-07 | Discharge: 2020-07-07 | Disposition: A | Discharge status: Home / Self Care | Payer: BC Managed Care – PPO | Attending: Registered Nurse | Admitting: Registered Nurse

## 2020-07-07 ENCOUNTER — Other Ambulatory Visit: Payer: Self-pay

## 2020-07-07 ENCOUNTER — Encounter (HOSPITAL_COMMUNITY): Payer: Self-pay | Admitting: Registered Nurse

## 2020-07-07 DIAGNOSIS — F4323 Adjustment disorder with mixed anxiety and depressed mood: Secondary | ICD-10-CM

## 2020-07-07 DIAGNOSIS — Z638 Other specified problems related to primary support group: Secondary | ICD-10-CM

## 2020-07-07 DIAGNOSIS — F419 Anxiety disorder, unspecified: Secondary | ICD-10-CM | POA: Diagnosis not present

## 2020-07-07 DIAGNOSIS — Z9152 Personal history of nonsuicidal self-harm: Secondary | ICD-10-CM

## 2020-07-07 NOTE — ED Notes (Signed)
Nurse Discharge Note:  D:Patient denies SI/HI/AVH at this time. Pt appears calm and cooperative, and no distress noted.  A:  Pt given AVS/follow-Up Appointment.  Pt escorted off unit by staff.  R:  Pt States she will comply with outpatient  services, and take medications as prescribed.
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
## 2020-07-07 NOTE — ED Provider Notes (Addendum)
[facility] Medical Screening Exam  Patient Name: Samantha [name] MRN: [id number] Date of Evaluation: 07/07/20 Chief Complaint:   Diagnosis:  Final diagnoses:  Adjustment disorder with mixed anxiety and depressed mood  Anxiety  Family discord    History of Present illness: Samantha [name] is a 34 y.o. female patient presents as walk accompanied by her wife with complaints of suicidal ideation.  At first patient would only say "I want my wife" after some encouragement patient reported that she and her wife had an argument this morning and during the argument she made a statement about hurting her self.  Patient states she has a history of self-harm since the age of 42 and the last time she did it was 2-3 months ago.  States she also told her wife that she was hearing voices but the voices only come "during a argument.   During evaluation Santosha I Tuel is alert/oriented x 4; calm/cooperative; and mood is congruent with affect.  She does not appear to be responding to internal/external stimuli or delusional thoughts.  Patient denies suicidal/self-harm/homicidal ideation, psychosis, and paranoia.  Patient answered question appropriately.  Permissions to get collateral information from her wife who is in agreement that the patient doesn't need inpatient psychiatric treatment and feels that the patient is safe to come home; but would like outpatient psychiatric services.       Psychiatric Specialty Exam  Presentation  General Appearance:Appropriate for Environment;Casual  Eye Contact:Absent  Speech:Clear and Coherent;Normal Rate  Speech Volume:Normal  Handedness:Right   Mood and Affect  Mood:Anxious;Depressed  Affect:Tearful   Thought Process  Thought Processes:Coherent;Goal Directed  Descriptions of Associations:Intact  Orientation:Full (Time, Place and Person)  Thought Content:WDL  Hallucinations:None  Ideas of Reference:None  Suicidal  Thoughts:No  Homicidal Thoughts:No   Sensorium  Memory:Immediate Good;Recent Good;Remote Good  Judgment:Intact  Insight:Present   Executive Functions  Concentration:Good  Attention Span:Good  Recall:Good  Fund of Knowledge:Good  Language:Good   Psychomotor Activity  Psychomotor Activity:Normal   Assets  Assets:Communication Skills;Desire for Improvement;Financial Resources/Insurance;Housing;Social Support   Sleep  Sleep:Good  Number of hours: No data recorded  Physical Exam: Physical Exam Vitals and nursing note reviewed.  Constitutional:      Appearance: Normal appearance. She is obese.  HENT:     Head: Normocephalic.  Cardiovascular:     Rate and Rhythm: Normal rate and regular rhythm.  Pulmonary:     Effort: Pulmonary effort is normal.  Musculoskeletal:        General: Normal range of motion.     Cervical back: Normal range of motion.  Skin:    General: Skin is warm and dry.  Neurological:     General: No focal deficit present.     Mental Status: She is alert and oriented to person, place, and time.  Psychiatric:        Attention and Perception: Attention and perception normal.        Mood and Affect: Mood is depressed. Affect is flat and tearful.        Speech: Speech normal.        Behavior: Behavior normal. Behavior is cooperative.        Thought Content: Thought content normal.        Cognition and Memory: Cognition and memory normal.        Judgment: Judgment normal.    Review of Systems  Psychiatric/Behavioral: Negative for depression, hallucinations, memory loss, substance abuse and suicidal ideas. The patient is not nervous/anxious and  does not have insomnia.   All other systems reviewed and are negative.  Blood pressure (!) 134/92, pulse 85, temperature 98.5 F (36.9 C), temperature source Oral, resp. rate 18, height 5\' 6"  (1.676 m), weight 233 lb (105.7 kg), SpO2 100 %. Body mass index is 37.61 kg/m.  Musculoskeletal: Strength &  Muscle Tone: within normal limits Gait & Station: normal Patient leans: N/A   [facility] MSE Discharge Disposition for Follow up and Recommendations: Based on my evaluation the patient does not appear to have an emergency medical condition and can be discharged with resources and follow up care in outpatient services for Medication Management, Individual Therapy and Group Therapy  Referral and outpatient psychiatric services resources given to patient  Salvadore Valvano, NP 07/07/2020, 2:58 PM

## 2020-07-08 ENCOUNTER — Telehealth (HOSPITAL_COMMUNITY): Payer: Self-pay | Admitting: Psychiatry

## 2020-12-01 ENCOUNTER — Other Ambulatory Visit: Payer: Self-pay

## 2020-12-01 ENCOUNTER — Encounter (HOSPITAL_COMMUNITY): Payer: Self-pay

## 2020-12-01 ENCOUNTER — Ambulatory Visit (HOSPITAL_COMMUNITY)
Admission: EM | Admit: 2020-12-01 | Discharge: 2020-12-01 | Disposition: A | Discharge status: Home / Self Care | Payer: BLUE CROSS/BLUE SHIELD | Attending: Family Medicine | Admitting: Family Medicine

## 2020-12-01 ENCOUNTER — Ambulatory Visit (INDEPENDENT_AMBULATORY_CARE_PROVIDER_SITE_OTHER): Payer: BLUE CROSS/BLUE SHIELD

## 2020-12-01 DIAGNOSIS — M79671 Pain in right foot: Secondary | ICD-10-CM

## 2020-12-01 MED ORDER — DICLOFENAC SODIUM 75 MG PO TBEC: 75.0000 mg | DELAYED_RELEASE_TABLET | Freq: Two times a day (BID) | ORAL | 0 refills | Status: DC

## 2020-12-01 NOTE — Discharge Instructions (Addendum)
The anti-inflammatory/pain medicine prescribed today substitutes for any ibuprofen or naproxyn over the counter. You may also take Tylenol as needed.

## 2020-12-01 NOTE — ED Triage Notes (Signed)
Pt in with c/o right foot pain that started on friday and a knot on her foot that she noticed yesterday  Pt has been taking tylenol; for pain releif

## 2020-12-01 NOTE — ED Provider Notes (Signed)
[facility]   [id number] 12/01/20 Arrival Time: 1002  ASSESSMENT & PLAN:  1. Foot pain, right     I have personally viewed the imaging studies ordered this visit. No fracture appreciated. No signs of healing stress fracture.  CAM boot fitted to help with pain and ambulation. Unclear of exact etiology. No swelling. No signs of infection. Work note provided.  Begin trial of: Meds ordered this encounter  Medications  . diclofenac (VOLTAREN) 75 MG EC tablet    Sig: Take 1 tablet (75 mg total) by mouth 2 (two) times daily.    Dispense:  14 tablet    Refill:  0    Orders Placed This Encounter  Procedures  . DG Foot Complete Right  . Apply CAM boot    Recommend:  Follow-up Information    Schedule an appointment as soon as possible for a visit  with [facility]).   Why: If worsening or failing to improve as anticipated. Contact information: [address] [place],  [place]  [address]  [phone]               Reviewed expectations re: course of current medical issues. Questions answered. Outlined signs and symptoms indicating need for more acute intervention. Patient verbalized understanding. After Visit Summary given.  SUBJECTIVE: History from: patient. Samantha [name] is a 35 y.o. female who reports persistent marked pain of her right dorsal lateral foot; described as sharp "with a knot"; without radiation. Onset: fairly abrupt; first noted yesterday. Injury/trama: none. Symptoms have progressed to a point and plateaued since beginning. Aggravating factors: certain movements and weight bearing. Alleviating factors: have not been identified. Associated symptoms: none reported. No swelling noted. Extremity sensation changes or weakness: none. Self treatment: acetaminophen, with no relief.  History of similar: no.  Past Surgical History:  Procedure Laterality Date  . CESAREAN SECTION N/A 05/12/2013   Procedure:  Primary cesarean section with delivery of baby boy at 0754.;  Surgeon: Freddrick March. Tenny Craw, MD;  Location: [facility];  Service: Obstetrics;  Laterality: N/A;  . REMOVAL OF IMPLANT Left 09/25/2015   Procedure: LEFT ANKLE REMOVAL OF DEEP  IMPLANT DEBRIDEMENT OF ANTERIOR MEDIAL ANKLE JOINT;  Surgeon: Toni Arthurs, MD;  Location: [facility];  Service: Orthopedics;  Laterality: Left;  . TIBIA FRACTURE SURGERY        OBJECTIVE:  Vitals:   12/01/20 1057  BP: 126/89  Pulse: 70  Resp: 19  Temp: 97.9 F (36.6 C)  SpO2: 100%    General appearance: alert; no distress HEENT: [place]; AT Neck: supple with FROM Resp: unlabored respirations Extremities: . RLE: warm with well perfused appearance; fairly well localized moderate tenderness over right mid dorsal to lateral foot with small know; swelling: none; bruising: none; ankle and all toes with FROM CV: brisk extremity capillary refill of RLE; 2+ DP pulse of RLE. Skin: warm and dry; no visible rashes Neurologic: normal sensation and strength of RLE Psychological: alert and cooperative; normal mood and affect  Imaging: DG Foot Complete Right  Result Date: 12/01/2020 CLINICAL DATA:  Dorsal lateral pain and swelling. Plantar and posterior calcaneal heel spurs identified. EXAM: RIGHT FOOT COMPLETE - 3+ VIEW COMPARISON:  None. FINDINGS: There is no evidence of fracture or dislocation. There are several well corticated os ossific densities adjacent to the medial malleolus which may represent sequelae of remote trauma or accessory ossicles. There is no evidence of arthropathy or other focal bone abnormality. Soft tissues are unremarkable.  IMPRESSION: 1. No acute findings. 2. Heel spurs Electronically Signed   By: Signa Kell M.D.   On: 12/01/2020 11:25      Allergies  Allergen Reactions  . Baclofen Other (See Comments)    Dizziness, feel foggy, "head feels funny"  . Tizanidine Other (See Comments)    Dizziness, feel foggy, "head feels funny"     Past Medical History:  Diagnosis Date  . Arthritis   . Chicken pox   . Chlamydia 12/02/2011  . Depression   . Fracture of left leg 2009   removal metal plate in left leg 2016  . Frequent headaches   . Infection    UTI  . Kidney stone    Kidney stone  . Migraines   . Multiple sclerosis (HCC)    receives care @ [facility]  . Neuropathy   . PCOS (polycystic ovarian syndrome)   . Tobacco abuse    Social History   Socioeconomic History  . Marital status: Legally Separated    Spouse name: Not on file  . Number of children: Not on file  . Years of education: Not on file  . Highest education level: Not on file  Occupational History  . Not on file  Tobacco Use  . Smoking status: Current Every Day Smoker    Packs/day: 0.25    Years: 2.00    Pack years: 0.50    Types: Cigarettes  . Smokeless tobacco: Never Used  . Tobacco comment: quit with preg  Vaping Use  . Vaping Use: Never used  Substance and Sexual Activity  . Alcohol use: Not Currently  . Drug use: Yes    Types: Marijuana    Comment: daily use  . Sexual activity: Yes    Birth control/protection: None  Other Topics Concern  . Not on file  Social History Narrative   Married, husband is truck Hospital doctor.  Lives with husband, son and sister.  Education some college.  Children 1.  Caffeine 2-3 sodas daily.    has a 9-year-old that is autistic.    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 Hypertension Other   . Diabetes Other   . Ulcers Mother   . GER disease Mother   . Asthma Mother   . Heart attack Mother   . Lupus Mother   . Cancer Father        kidney  . Drug abuse Father   . GER disease Sister   . Asthma  Brother   . Birth defects Maternal Grandfather   . Heart attack Maternal Grandfather   . High blood pressure Maternal Grandfather   . Stroke Maternal Grandfather   . Autism Son   . Learning disabilities Son    Past Surgical History:  Procedure Laterality Date  . CESAREAN SECTION N/A 05/12/2013   Procedure: Primary cesarean section with delivery of baby boy at 0754.;  Surgeon: Freddrick March. Tenny Craw, MD;  Location: [facility];  Service: Obstetrics;  Laterality: N/A;  . REMOVAL OF IMPLANT Left 09/25/2015   Procedure: LEFT ANKLE REMOVAL OF DEEP  IMPLANT DEBRIDEMENT OF ANTERIOR MEDIAL ANKLE JOINT;  Surgeon: Toni Arthurs, MD;  Location: [facility];  Service: Orthopedics;  Laterality: Left;  . TIBIA FRACTURE SURGERY        Mardella Layman, MD 12/03/20 [phone]

## 2020-12-26 ENCOUNTER — Encounter: Payer: BLUE CROSS/BLUE SHIELD | Admitting: Family Medicine

## 2020-12-30 ENCOUNTER — Encounter: Payer: Self-pay | Admitting: Family Medicine

## 2021-03-18 ENCOUNTER — Other Ambulatory Visit: Payer: Self-pay

## 2021-03-18 ENCOUNTER — Ambulatory Visit (HOSPITAL_COMMUNITY)
Admission: EM | Admit: 2021-03-18 | Discharge: 2021-03-18 | Disposition: A | Discharge status: Home / Self Care | Payer: BLUE CROSS/BLUE SHIELD | Attending: Emergency Medicine | Admitting: Emergency Medicine

## 2021-03-18 ENCOUNTER — Encounter (HOSPITAL_COMMUNITY): Payer: Self-pay

## 2021-03-18 DIAGNOSIS — F17210 Nicotine dependence, cigarettes, uncomplicated: Secondary | ICD-10-CM | POA: Insufficient documentation

## 2021-03-18 DIAGNOSIS — J029 Acute pharyngitis, unspecified: Secondary | ICD-10-CM | POA: Diagnosis not present

## 2021-03-18 DIAGNOSIS — Z20822 Contact with and (suspected) exposure to covid-19: Secondary | ICD-10-CM | POA: Insufficient documentation

## 2021-03-18 DIAGNOSIS — R059 Cough, unspecified: Secondary | ICD-10-CM | POA: Insufficient documentation

## 2021-03-18 DIAGNOSIS — J069 Acute upper respiratory infection, unspecified: Secondary | ICD-10-CM

## 2021-03-18 MED ORDER — TUZISTRA XR 14.7-2.8 MG/5ML PO SUER: 5.0000 mL | Freq: Two times a day (BID) | ORAL | 0 refills | Status: DC | PRN

## 2021-03-18 MED ORDER — BENZONATATE 100 MG PO CAPS: 200.0000 mg | ORAL_CAPSULE | Freq: Three times a day (TID) | ORAL | 0 refills | Status: DC | PRN

## 2021-03-18 NOTE — ED Provider Notes (Signed)
[facility]    CSN: [id number] Arrival date & time: 03/18/21  1810      History   Chief Complaint Chief Complaint  Patient presents with   Sore Throat   Cough    HPI Samantha [name] is a 35 y.o. female.   Patient here for evaluation of cough and sore throat that started Monday.  Patient reports notified earlier today that a coworker recently tested positive for COVID.  Reports taking OTC pain medication with minimal relief.  Reports cough is nonproductive and  worse at night.  The history is provided by the patient.  Sore Throat  Cough Associated symptoms: sore throat    Past Medical History:  Diagnosis Date   Arthritis    Chicken pox    Chlamydia 12/02/2011   Depression    Fracture of left leg 2009   removal metal plate in left leg 2016   Frequent headaches    Infection    UTI   Kidney stone    Kidney stone   Migraines    Multiple sclerosis (HCC)    receives care @ [facility]   Neuropathy    PCOS (polycystic ovarian syndrome)    Tobacco abuse     Patient Active Problem List   Diagnosis Date Noted   Adjustment disorder with mixed anxiety and depressed mood 07/07/2020   Family discord 07/07/2020   Personal history of nonsuicidal self-harm 07/07/2020   Prediabetes 12/20/2018   Dyslipidemia 12/20/2018   Depression, major, single episode, moderate (HCC) 12/19/2018   Anxiety 12/19/2018   MS (multiple sclerosis) (HCC) 12/16/2017   Nicotine dependence with current use     Past Surgical History:  Procedure Laterality Date   CESAREAN SECTION N/A 05/12/2013   Procedure: Primary cesarean section with delivery of baby boy at 0754.;  Surgeon: Freddrick March. Tenny Craw, MD;  Location: [facility];  Service: Obstetrics;  Laterality: N/A;   REMOVAL OF IMPLANT Left 09/25/2015   Procedure: LEFT ANKLE REMOVAL OF DEEP  IMPLANT DEBRIDEMENT OF ANTERIOR MEDIAL ANKLE JOINT;  Surgeon: Toni Arthurs, MD;  Location: [facility];  Service: Orthopedics;  Laterality: Left;    TIBIA FRACTURE SURGERY      OB History     Gravida  1   Para  1   Term  1   Preterm      AB      Living  1      SAB      IAB      Ectopic      Multiple      Live Births  1            Home Medications    Prior to Admission medications   Medication Sig Start Date End Date Taking? Authorizing Provider  benzonatate (TESSALON PERLES) 100 MG capsule Take 2 capsules (200 mg total) by mouth 3 (three) times daily as needed for cough. 03/18/21  Yes Ivette Loyal, NP  Codeine Polt-Chlorphen Polt ER (TUZISTRA XR) 14.7-2.8 MG/5ML SUER Take 5 mLs by mouth 2 (two) times daily as needed. 03/18/21  Yes Ivette Loyal, NP  buPROPion (WELLBUTRIN XL) 150 MG 24 hr tablet TAKE 1 TABLET BY MOUTH EVERY DAY 01/16/20   Ardith Dark, MD  cholecalciferol (VITAMIN D) 1000 units tablet Take 2,000 Units by mouth daily.     [provider]  diclofenac (VOLTAREN) 75 MG EC tablet Take 1 tablet (75 mg total) by mouth 2 (two) times daily. 12/01/20  Mardella Layman, MD  fexofenadine (ALLEGRA) 60 MG tablet Take 1 tablet (60 mg total) by mouth 2 (two) times daily. 01/13/19   Wurst, [place], PA-C  gabapentin (NEURONTIN) 300 MG capsule TAKE 1 CAPSULE AT BEDTIME FOR 5 DAYS, IF TOLERATED INCREASE TO 1 CAPSULE TWICE A DAY. 07/06/19   [provider]  ocrelizumab (OCREVUS) 300 MG/10ML injection Inject 600 mg into the vein every 6 (six) months.  09/14/19   [provider]    Family History Family History  Problem Relation Age of Onset   Hypertension Other    Diabetes Other    Ulcers Mother    GER disease Mother    Asthma Mother    Heart attack Mother    Lupus Mother    Cancer Father        kidney   Drug abuse Father    GER disease Sister    Asthma Brother    Birth defects Maternal Grandfather    Heart attack Maternal Grandfather    High blood pressure Maternal Grandfather    Stroke Maternal Grandfather    Autism Son    Learning disabilities Son     Social  History Social History   Tobacco Use   Smoking status: Every Day    Packs/day: 0.25    Years: 2.00    Pack years: 0.50    Types: Cigarettes   Smokeless tobacco: Never   Tobacco comments:    quit with preg  Vaping Use   Vaping Use: Never used  Substance Use Topics   Alcohol use: Not Currently   Drug use: Yes    Types: Marijuana    Comment: daily use     Allergies   Baclofen and Tizanidine   Review of Systems Review of Systems  HENT:  Positive for congestion and sore throat. Negative for trouble swallowing.   Respiratory:  Positive for cough.     Physical Exam Triage Vital Signs ED Triage Vitals  Enc Vitals Group     BP 03/18/21 1824 116/73     Pulse Rate 03/18/21 1822 92     Resp 03/18/21 1822 18     Temp 03/18/21 1822 98.9 F (37.2 C)     Temp Source 03/18/21 1822 Oral     SpO2 03/18/21 1822 98 %     Weight --      Height --      Head Circumference --      Peak Flow --      Pain Score 03/18/21 1820 6     Pain Loc --      Pain Edu? --      Excl. in GC? --    No data found.  Updated Vital Signs BP 116/73   Pulse 92   Temp 98.9 F (37.2 C) (Oral)   Resp 18   LMP 03/04/2021   SpO2 98%   Visual Acuity Right Eye Distance:   Left Eye Distance:   Bilateral Distance:    Right Eye Near:   Left Eye Near:    Bilateral Near:     Physical Exam Vitals and nursing note reviewed.  Constitutional:      General: She is not in acute distress.    Appearance: Normal appearance. She is not ill-appearing, toxic-appearing or diaphoretic.  HENT:     Head: Normocephalic and atraumatic.     Nose: No congestion.     Mouth/Throat:     Mouth: Mucous membranes are moist.     Pharynx: Uvula  midline. Posterior oropharyngeal erythema present. No pharyngeal swelling.     Tonsils: No tonsillar exudate or tonsillar abscesses. 0 on the right. 0 on the left.  Eyes:     Conjunctiva/sclera: Conjunctivae normal.  Cardiovascular:     Rate and Rhythm: Normal rate.      Pulses: Normal pulses.     Heart sounds: Normal heart sounds.  Pulmonary:     Effort: Pulmonary effort is normal.     Breath sounds: Normal breath sounds.  Abdominal:     General: Abdomen is flat.  Musculoskeletal:        General: Normal range of motion.     Cervical back: Normal range of motion and neck supple.  Skin:    General: Skin is warm and dry.  Neurological:     General: No focal deficit present.     Mental Status: She is alert and oriented to person, place, and time.  Psychiatric:        Mood and Affect: Mood normal.     UC Treatments / Results  Labs (all labs ordered are listed, but only abnormal results are displayed) Labs Reviewed - No data to display  EKG   Radiology No results found.  Procedures Procedures (including critical care time)  Medications Ordered in UC Medications - No data to display  Initial Impression / Assessment and Plan / UC Course  I have reviewed the triage vital signs and the nursing notes.  Pertinent labs & imaging results that were available during my care of the patient were reviewed by me and considered in my medical decision making (see chart for details).    Assessment negative labs or concerns.  COVID test pending.  Discussed the patient need to quarantine while awaiting results and then progress to 5 days if your test is positive.  Please take Jerilynn Som for [place] as needed for cough.  Discussed conservative symptom management as described in discharge instructions.  Follow-up with primary care as needed Final Clinical Impressions(s) / UC Diagnoses   Final diagnoses:  Viral URI with cough     Discharge Instructions      Take the Tessalon Perles as needed for cough.  You can take the [place] as needed for cough at night as it can make you sleepy.   We will contact you if your COVID test is positive.  Please quarantine while you wait for the results.  If your test is negative you may resume normal activities.   If your test is positive please continue to quarantine for at least 5 days from your symptom onset or until you are without a fever for at least 24 hours after the medications.  You can take Tylenol and/or Ibuprofen as needed for fever reduction and pain relief.    For cough: honey 1/2 to 1 teaspoon (you can dilute the honey in water or another fluid).  You can also use guaifenesin and dextromethorphan for cough. You can use a humidifier for chest congestion and cough.  If you don't have a humidifier, you can sit in the bathroom with the hot shower running.     For sore throat: try warm salt water gargles, cepacol lozenges, throat spray, warm tea or water with lemon/honey, popsicles or ice, or OTC cold relief medicine for throat discomfort.    For congestion: take a daily anti-histamine like Zyrtec, Claritin, and a oral decongestant, such as pseudoephedrine.  You can also use Flonase 1-2 sprays in each nostril daily.    It  is important to stay hydrated: drink plenty of fluids (water, gatorade/powerade/pedialyte, juices, or teas) to keep your throat moisturized and help further relieve irritation/discomfort.   Return or go to the Emergency Department if symptoms worsen or do not improve in the next few days.    ED Prescriptions     Medication Sig Dispense Auth. Provider   benzonatate (TESSALON PERLES) 100 MG capsule Take 2 capsules (200 mg total) by mouth 3 (three) times daily as needed for cough. 20 capsule Ivette Loyal, NP   Codeine Polt-Chlorphen Polt ER (TUZISTRA XR) 14.7-2.8 MG/5ML SUER Take 5 mLs by mouth 2 (two) times daily as needed. 280 mL Ivette Loyal, NP      PDMP not reviewed this encounter.   Ivette Loyal, NP 03/18/21 [phone]

## 2021-03-18 NOTE — ED Triage Notes (Signed)
Pt c/o sore throat and cough x 3 days. She states she has been exposed to a coworker that has COVID.

## 2021-03-18 NOTE — Discharge Instructions (Signed)
Take the [facility] as needed for cough.  You can take the [place] as needed for cough at night as it can make you sleepy.   We will contact you if your COVID test is positive.  Please quarantine while you wait for the results.  If your test is negative you may resume normal activities.  If your test is positive please continue to quarantine for at least 5 days from your symptom onset or until you are without a fever for at least 24 hours after the medications.  You can take Tylenol and/or Ibuprofen as needed for fever reduction and pain relief.    For cough: honey 1/2 to 1 teaspoon (you can dilute the honey in water or another fluid).  You can also use guaifenesin and dextromethorphan for cough. You can use a humidifier for chest congestion and cough.  If you don't have a humidifier, you can sit in the bathroom with the hot shower running.     For sore throat: try warm salt water gargles, cepacol lozenges, throat spray, warm tea or water with lemon/honey, popsicles or ice, or OTC cold relief medicine for throat discomfort.    For congestion: take a daily anti-histamine like Zyrtec, Claritin, and a oral decongestant, such as pseudoephedrine.  You can also use Flonase 1-2 sprays in each nostril daily.    It is important to stay hydrated: drink plenty of fluids (water, gatorade/powerade/pedialyte, juices, or teas) to keep your throat moisturized and help further relieve irritation/discomfort.   Return or go to the Emergency Department if symptoms worsen or do not improve in the next few days.

## 2021-03-19 LAB — SARS CORONAVIRUS 2 (TAT 6-24 HRS): SARS Coronavirus 2: NEGATIVE

## 2021-09-16 ENCOUNTER — Ambulatory Visit (HOSPITAL_COMMUNITY)
Admission: EM | Admit: 2021-09-16 | Discharge: 2021-09-16 | Disposition: A | Discharge status: Home / Self Care | Payer: BLUE CROSS/BLUE SHIELD | Attending: Physician Assistant | Admitting: Physician Assistant

## 2021-09-16 ENCOUNTER — Encounter (HOSPITAL_COMMUNITY): Payer: Self-pay

## 2021-09-16 ENCOUNTER — Other Ambulatory Visit: Payer: Self-pay

## 2021-09-16 DIAGNOSIS — B349 Viral infection, unspecified: Secondary | ICD-10-CM | POA: Insufficient documentation

## 2021-09-16 DIAGNOSIS — Z20822 Contact with and (suspected) exposure to covid-19: Secondary | ICD-10-CM | POA: Diagnosis not present

## 2021-09-16 LAB — POC INFLUENZA A AND B ANTIGEN (URGENT CARE ONLY)
INFLUENZA A ANTIGEN, POC: NEGATIVE
INFLUENZA B ANTIGEN, POC: NEGATIVE

## 2021-09-16 LAB — SARS CORONAVIRUS 2 (TAT 6-24 HRS): SARS Coronavirus 2: NEGATIVE

## 2021-09-16 NOTE — ED Provider Notes (Signed)
[place]    CSN: [id number] Arrival date & time: 09/16/21  0808      History   Chief Complaint Chief Complaint  Patient presents with   Influenza    HPI Samantha [name] is a 35 y.o. female.   The history is provided by the patient. No language interpreter was used.  Influenza Presenting symptoms: cough and rhinorrhea   Severity:  Mild Onset quality:  Gradual Progression:  Worsening Chronicity:  New Relieved by:  Nothing Worsened by:  Nothing Ineffective treatments:  None tried Associated symptoms: nasal congestion    Past Medical History:  Diagnosis Date   Arthritis    Chicken pox    Chlamydia 12/02/2011   Depression    Fracture of left leg 2009   removal metal plate in left leg [id number]   Frequent headaches    Infection    UTI   Kidney stone    Kidney stone   Migraines    Multiple sclerosis ([place])    receives care @ [facility]   Neuropathy    PCOS (polycystic ovarian syndrome)    Tobacco abuse     Patient Active Problem List   Diagnosis Date Noted   Adjustment disorder with mixed anxiety and depressed mood 07/07/2020   Family discord 07/07/2020   Personal history of nonsuicidal self-harm 07/07/2020   Prediabetes 12/20/2018   Dyslipidemia 12/20/2018   Depression, major, single episode, moderate ([place]) 12/19/2018   Anxiety 12/19/2018   MS (multiple sclerosis) ([place]) 12/16/2017   Nicotine dependence with current use     Past Surgical History:  Procedure Laterality Date   CESAREAN SECTION N/A 05/12/2013   Procedure: Primary cesarean section with delivery of baby boy at 0754.;  Surgeon: Farrel Gobble. Harrington Challenger, MD;  Location: [facility];  Service: Obstetrics;  Laterality: N/A;   REMOVAL OF IMPLANT Left 09/25/2015   Procedure: LEFT ANKLE REMOVAL OF DEEP  IMPLANT DEBRIDEMENT OF ANTERIOR MEDIAL ANKLE JOINT;  Surgeon: Wylene Simmer, MD;  Location: [facility];  Service: Orthopedics;  Laterality: Left;   TIBIA FRACTURE SURGERY      OB History      Gravida  1   Para  1   Term  1   Preterm      AB      Living  1      SAB      IAB      Ectopic      Multiple      Live Births  1            Home Medications    Prior to Admission medications   Medication Sig Start Date End Date Taking? Authorizing Provider  benzonatate (TESSALON PERLES) 100 MG capsule Take 2 capsules (200 mg total) by mouth 3 (three) times daily as needed for cough. 03/18/21   Pearson Forster, NP  buPROPion (WELLBUTRIN XL) 150 MG 24 hr tablet TAKE 1 TABLET BY MOUTH EVERY DAY 01/16/20   Vivi Barrack, MD  cholecalciferol (VITAMIN D) 1000 units tablet Take 2,000 Units by mouth daily.     [provider]  Codeine Polt-Chlorphen Polt ER (TUZISTRA XR) 14.7-2.8 MG/5ML SUER Take 5 mLs by mouth 2 (two) times daily as needed. 03/18/21   Pearson Forster, NP  diclofenac (VOLTAREN) 75 MG EC tablet Take 1 tablet (75 mg total) by mouth 2 (two) times daily. 12/01/20   Vanessa Kick, MD  fexofenadine (ALLEGRA) 60 MG tablet Take 1 tablet (60 mg total)  by mouth 2 (two) times daily. 01/13/19   Wurst, [place], PA-C  gabapentin (NEURONTIN) 300 MG capsule TAKE 1 CAPSULE AT BEDTIME FOR 5 DAYS, IF TOLERATED INCREASE TO 1 CAPSULE TWICE A DAY. 07/06/19   [provider]  ocrelizumab (OCREVUS) 300 MG/10ML injection Inject 600 mg into the vein every 6 (six) months.  09/14/19   [provider]    Family History Family History  Problem Relation Age of Onset   Hypertension Other    Diabetes Other    Ulcers Mother    GER disease Mother    Asthma Mother    Heart attack Mother    Lupus Mother    Cancer Father        kidney   Drug abuse Father    GER disease Sister    Asthma Brother    Birth defects Maternal Grandfather    Heart attack Maternal Grandfather    High blood pressure Maternal Grandfather    Stroke Maternal Grandfather    Autism Son    Learning disabilities Son     Social History Social History   Tobacco Use   Smoking  status: Every Day    Packs/day: 0.25    Years: 2.00    Pack years: 0.50    Types: Cigarettes   Smokeless tobacco: Never   Tobacco comments:    quit with preg  Vaping Use   Vaping Use: Never used  Substance Use Topics   Alcohol use: Not Currently   Drug use: Yes    Types: Marijuana    Comment: daily use     Allergies   Baclofen and Tizanidine   Review of Systems Review of Systems  HENT:  Positive for congestion and rhinorrhea.   Respiratory:  Positive for cough.   All other systems reviewed and are negative.   Physical Exam Triage Vital Signs ED Triage Vitals  Enc Vitals Group     BP 09/16/21 0844 124/77     Pulse Rate 09/16/21 0844 83     Resp 09/16/21 0844 18     Temp 09/16/21 0844 98.2 F (36.8 C)     Temp Source 09/16/21 0844 Oral     SpO2 09/16/21 0844 98 %     Weight --      Height --      Head Circumference --      Peak Flow --      Pain Score 09/16/21 0845 6     Pain Loc --      Pain Edu? --      Excl. in [place]? --    No data found.  Updated Vital Signs BP 124/77 (BP Location: Right Arm)    Pulse 83    Temp 98.2 F (36.8 C) (Oral)    Resp 18    LMP 08/16/2021    SpO2 98%   Visual Acuity Right Eye Distance:   Left Eye Distance:   Bilateral Distance:    Right Eye Near:   Left Eye Near:    Bilateral Near:     Physical Exam Vitals and nursing note reviewed.  Constitutional:      Appearance: She is well-developed.  HENT:     Head: Normocephalic.     Nose: Nose normal.     Mouth/Throat:     Mouth: Mucous membranes are moist.  Cardiovascular:     Rate and Rhythm: Normal rate.  Pulmonary:     Effort: Pulmonary effort is normal.  Abdominal:  General: There is no distension.  Musculoskeletal:        General: Normal range of motion.     Cervical back: Normal range of motion.  Neurological:     General: No focal deficit present.     Mental Status: She is alert and oriented to person, place, and time.  Psychiatric:        Mood and  Affect: Mood normal.     UC Treatments / Results  Labs (all labs ordered are listed, but only abnormal results are displayed) Labs Reviewed  SARS CORONAVIRUS 2 (TAT 6-24 HRS)    EKG   Radiology No results found.  Procedures Procedures (including critical care time)  Medications Ordered in UC Medications - No data to display  Initial Impression / Assessment and Plan / UC Course  I have reviewed the triage vital signs and the nursing notes.  Pertinent labs & imaging results that were available during my care of the patient were reviewed by me and considered in my medical decision making (see chart for details).     Final Clinical Impressions(s) / UC Diagnoses   Final diagnoses:  Viral illness   Discharge Instructions   None    ED Prescriptions   None    PDMP not reviewed this encounter. An After Visit Summary was printed and given to the patient.    Fransico Meadow, [place] 09/16/21 1023

## 2021-09-16 NOTE — Discharge Instructions (Signed)
Your flu test is negative,  Covid is pending.  Tylenol for symptoms.  Return if any problems.

## 2021-09-16 NOTE — ED Triage Notes (Signed)
Pt presents with shortness of breath, sore throat, generalized body aches, and chills X 3 days.

## 2022-06-19 ENCOUNTER — Ambulatory Visit (HOSPITAL_COMMUNITY)
Admission: EM | Admit: 2022-06-19 | Discharge: 2022-06-19 | Disposition: A | Discharge status: Home / Self Care | Payer: Self-pay | Attending: Emergency Medicine | Admitting: Emergency Medicine

## 2022-06-19 DIAGNOSIS — G35 Multiple sclerosis: Secondary | ICD-10-CM | POA: Diagnosis not present

## 2022-06-19 MED ORDER — METHYLPREDNISOLONE SODIUM SUCC 125 MG IJ SOLR
125.0000 mg | Freq: Once | INTRAMUSCULAR | Status: AC
  Administered 2022-06-19: 125 mg via INTRAMUSCULAR

## 2022-06-19 MED ORDER — PREDNISONE 10 MG (21) PO TBPK: ORAL_TABLET | Freq: Every day | ORAL | 0 refills | Status: DC

## 2022-06-19 MED ORDER — METHYLPREDNISOLONE SODIUM SUCC 125 MG IJ SOLR
INTRAMUSCULAR | Status: AC
  Filled 2022-06-19: qty 2

## 2022-06-19 NOTE — ED Provider Notes (Signed)
[place]    CSN: [id number] Arrival date & time: 06/19/22  1049      History   Chief Complaint Chief Complaint  Patient presents with   Generalized Body Aches   Dizziness   Headache   flare up    HPI Samantha [name] is a 36 y.o. female.   Patient presents with generalized body and joint aching, sensation of fogginess to the head, eye intermittent posterior headache and intermittent dizziness for 3 to 4 days.  Believes that her multiple sclerosis is flared as this is a typical presentation.  Endorses that she missed her infusion dose in June due to a change in insurance, has upcoming appointment with neurology next month to reestablish care.  Denies upper respiratory symptoms, fevers, chills, visual changes, photophobia, phonophobia, lightheadedness, memory or speech changes, weakness.     Past Medical History:  Diagnosis Date   Arthritis    Chicken pox    Chlamydia 12/02/2011   Depression    Fracture of left leg 2009   removal metal plate in left leg [id number]   Frequent headaches    Infection    UTI   Kidney stone    Kidney stone   Migraines    Multiple sclerosis ([place])    receives care @ [facility]   Neuropathy    PCOS (polycystic ovarian syndrome)    Tobacco abuse     Patient Active Problem List   Diagnosis Date Noted   Adjustment disorder with mixed anxiety and depressed mood 07/07/2020   Family discord 07/07/2020   Personal history of nonsuicidal self-harm 07/07/2020   Prediabetes 12/20/2018   Dyslipidemia 12/20/2018   Depression, major, single episode, moderate ([place]) 12/19/2018   Anxiety 12/19/2018   MS (multiple sclerosis) ([place]) 12/16/2017   Nicotine dependence with current use     Past Surgical History:  Procedure Laterality Date   CESAREAN SECTION N/A 05/12/2013   Procedure: Primary cesarean section with delivery of baby boy at 0754.;  Surgeon: Farrel Gobble. Harrington Challenger, MD;  Location: [facility];  Service: Obstetrics;  Laterality: N/A;   REMOVAL OF  IMPLANT Left 09/25/2015   Procedure: LEFT ANKLE REMOVAL OF DEEP  IMPLANT DEBRIDEMENT OF ANTERIOR MEDIAL ANKLE JOINT;  Surgeon: Wylene Simmer, MD;  Location: [facility];  Service: Orthopedics;  Laterality: Left;   TIBIA FRACTURE SURGERY      OB History     Gravida  1   Para  1   Term  1   Preterm      AB      Living  1      SAB      IAB      Ectopic      Multiple      Live Births  1            Home Medications    Prior to Admission medications   Medication Sig Start Date End Date Taking? Authorizing Provider  benzonatate (TESSALON PERLES) 100 MG capsule Take 2 capsules (200 mg total) by mouth 3 (three) times daily as needed for cough. 03/18/21   Pearson Forster, NP  buPROPion (WELLBUTRIN XL) 150 MG 24 hr tablet TAKE 1 TABLET BY MOUTH EVERY DAY 01/16/20   Vivi Barrack, MD  cholecalciferol (VITAMIN D) 1000 units tablet Take 2,000 Units by mouth daily.     [provider]  Codeine Polt-Chlorphen Polt ER (TUZISTRA XR) 14.7-2.8 MG/5ML SUER Take 5 mLs by mouth 2 (two) times daily as  needed. 03/18/21   Pearson Forster, NP  diclofenac (VOLTAREN) 75 MG EC tablet Take 1 tablet (75 mg total) by mouth 2 (two) times daily. 12/01/20   Vanessa Kick, MD  fexofenadine (ALLEGRA) 60 MG tablet Take 1 tablet (60 mg total) by mouth 2 (two) times daily. 01/13/19   Wurst, [place], PA-C  gabapentin (NEURONTIN) 300 MG capsule TAKE 1 CAPSULE AT BEDTIME FOR 5 DAYS, IF TOLERATED INCREASE TO 1 CAPSULE TWICE A DAY. 07/06/19   [provider]  ocrelizumab (OCREVUS) 300 MG/10ML injection Inject 600 mg into the vein every 6 (six) months.  09/14/19   [provider]    Family History Family History  Problem Relation Age of Onset   Hypertension Other    Diabetes Other    Ulcers Mother    GER disease Mother    Asthma Mother    Heart attack Mother    Lupus Mother    Cancer Father        kidney   Drug abuse Father    GER disease Sister    Asthma Brother     Birth defects Maternal Grandfather    Heart attack Maternal Grandfather    High blood pressure Maternal Grandfather    Stroke Maternal Grandfather    Autism Son    Learning disabilities Son     Social History Social History   Tobacco Use   Smoking status: Every Day    Packs/day: 0.25    Years: 2.00    Total pack years: 0.50    Types: Cigarettes   Smokeless tobacco: Never   Tobacco comments:    quit with preg  Vaping Use   Vaping Use: Never used  Substance Use Topics   Alcohol use: Not Currently   Drug use: Yes    Types: Marijuana    Comment: daily use     Allergies   Baclofen and Tizanidine   Review of Systems Review of Systems  Constitutional: Negative.   HENT: Negative.    Respiratory: Negative.    Cardiovascular: Negative.   Gastrointestinal: Negative.   Skin: Negative.   Neurological:  Positive for dizziness and headaches. Negative for tremors, seizures, syncope, facial asymmetry, speech difficulty, weakness, light-headedness and numbness.     Physical Exam Triage Vital Signs ED Triage Vitals [06/19/22 1132]  Enc Vitals Group     BP 133/86     Pulse Rate 72     Resp 18     Temp 98.5 F (36.9 C)     Temp Source Oral     SpO2 98 %     Weight      Height      Head Circumference      Peak Flow      Pain Score      Pain Loc      Pain Edu?      Excl. in [place]?    No data found.  Updated Vital Signs BP 133/86 (BP Location: Left Arm)   Pulse 72   Temp 98.5 F (36.9 C) (Oral)   Resp 18   SpO2 98%   Visual Acuity Right Eye Distance:   Left Eye Distance:   Bilateral Distance:    Right Eye Near:   Left Eye Near:    Bilateral Near:     Physical Exam Constitutional:      Appearance: Normal appearance. She is well-developed.  HENT:     Head: Normocephalic.  Eyes:     Extraocular Movements: Extraocular movements  intact.  Pulmonary:     Effort: Pulmonary effort is normal.  Neurological:     General: No focal deficit present.      Mental Status: She is alert and oriented to person, place, and time. Mental status is at baseline.     Cranial Nerves: No cranial nerve deficit.     Sensory: No sensory deficit.     Motor: No weakness.     Gait: Gait normal.  Psychiatric:        Mood and Affect: Mood normal.        Behavior: Behavior normal.      UC Treatments / Results  Labs (all labs ordered are listed, but only abnormal results are displayed) Labs Reviewed - No data to display  EKG   Radiology No results found.  Procedures Procedures (including critical care time)  Medications Ordered in UC Medications - No data to display  Initial Impression / Assessment and Plan / UC Course  I have reviewed the triage vital signs and the nursing notes.  Pertinent labs & imaging results that were available during my care of the patient were reviewed by me and considered in my medical decision making (see chart for details).  Multiple sclerosis  Vital signs are stable and patient while ill-appearing is in no signs of distress, no abnormalities neurologically and therefore we will move forward with treatment for MS, methylprednisolone injection given in office and prednisone 60 mg taper prescribed for outpatient, recommended heat over the affected area, mild exercise and rest as needed, advised patient to keep upcoming neurology appointment, may follow-up with urgent care as needed, work note given Final Clinical Impressions(s) / UC Diagnoses   Final diagnoses:  None   Discharge Instructions   None    ED Prescriptions   None    PDMP not reviewed this encounter.   Hans Eden, [place] 06/19/22 1207

## 2022-06-19 NOTE — Discharge Instructions (Signed)
Today you are being treated for a flareup of your multiple sclerosis  You have been given an injection of steroids here in the office to help reduce the inflammatory response that occurs with this condition  Starting tomorrow begin prednisone every morning with food as directed to continue the above process  May attempt low-impact, mild exercises, such as swimming, walking, and yoga. Regular exercise can help alleviate symptoms and increase strength and balance.  May attempt heat over the affected area in 10 to 15-minute intervals to further help sooth symptoms  Please keep your upcoming neurology appointment so that you ideally may start your medicine back  Please follow-up with urgent care as needed if symptoms persist or return prior to your neurology appointment

## 2022-06-19 NOTE — ED Triage Notes (Signed)
Pt reports having a flare-up today from Samantha [name]. She has been out of medication since Feb. Pt reports body aches,pain and malaise.

## 2022-07-28 ENCOUNTER — Encounter (HOSPITAL_COMMUNITY): Payer: Self-pay | Admitting: Emergency Medicine

## 2022-07-28 ENCOUNTER — Ambulatory Visit (HOSPITAL_COMMUNITY)
Admission: EM | Admit: 2022-07-28 | Discharge: 2022-07-28 | Disposition: A | Discharge status: Home / Self Care | Payer: Commercial Managed Care - HMO | Attending: Emergency Medicine | Admitting: Emergency Medicine

## 2022-07-28 DIAGNOSIS — N898 Other specified noninflammatory disorders of vagina: Secondary | ICD-10-CM | POA: Diagnosis present

## 2022-07-28 DIAGNOSIS — B3731 Acute candidiasis of vulva and vagina: Secondary | ICD-10-CM | POA: Diagnosis not present

## 2022-07-28 DIAGNOSIS — R102 Pelvic and perineal pain: Secondary | ICD-10-CM | POA: Diagnosis present

## 2022-07-28 MED ORDER — FLUCONAZOLE 150 MG PO TABS: 150.0000 mg | ORAL_TABLET | Freq: Every day | ORAL | 1 refills | Status: DC

## 2022-07-28 NOTE — ED Triage Notes (Signed)
Pt reports pelvic pain, fever and clear vaginal discharge x 3 days. Also endorses vaginal irritation.

## 2022-07-28 NOTE — Discharge Instructions (Signed)
Your test are pending

## 2022-07-28 NOTE — ED Provider Notes (Signed)
[facility]    CSN: [id number] Arrival date & time: 07/28/22  1746      History   Chief Complaint Chief Complaint  Patient presents with   Pelvic Pain    HPI Samantha [name] is a 36 y.o. female.   Pt complains of a vaginal discharge.    The history is provided by the patient.  Pelvic Pain This is a new problem. The problem occurs constantly. The problem has not changed since onset.Nothing aggravates the symptoms. Nothing relieves the symptoms. She has tried nothing for the symptoms. The treatment provided no relief.    Past Medical History:  Diagnosis Date   Arthritis    Chicken pox    Chlamydia 12/02/2011   Depression    Fracture of left leg 2009   removal metal plate in left leg 2016   Frequent headaches    Infection    UTI   Kidney stone    Kidney stone   Migraines    Multiple sclerosis ([facility])    receives care @ [facility]   Neuropathy    PCOS (polycystic ovarian syndrome)    Tobacco abuse     Patient Active Problem List   Diagnosis Date Noted   Adjustment disorder with mixed anxiety and depressed mood 07/07/2020   Family discord 07/07/2020   Personal history of nonsuicidal self-harm 07/07/2020   Prediabetes 12/20/2018   Dyslipidemia 12/20/2018   Depression, major, single episode, moderate ([facility]) 12/19/2018   Anxiety 12/19/2018   MS (multiple sclerosis) ([facility]) 12/16/2017   Nicotine dependence with current use     Past Surgical History:  Procedure Laterality Date   CESAREAN SECTION N/A 05/12/2013   Procedure: Primary cesarean section with delivery of baby boy at 0754.;  Surgeon: Freddrick March. Tenny Craw, MD;  Location: [facility];  Service: Obstetrics;  Laterality: N/A;   REMOVAL OF IMPLANT Left 09/25/2015   Procedure: LEFT ANKLE REMOVAL OF DEEP  IMPLANT DEBRIDEMENT OF ANTERIOR MEDIAL ANKLE JOINT;  Surgeon: Toni Arthurs, MD;  Location: [facility];  Service: Orthopedics;  Laterality: Left;   TIBIA FRACTURE SURGERY      OB History      Gravida  1   Para  1   Term  1   Preterm      AB      Living  1      SAB      IAB      Ectopic      Multiple      Live Births  1            Home Medications    Prior to Admission medications   Medication Sig Start Date End Date Taking? Authorizing Provider  benzonatate (TESSALON PERLES) 100 MG capsule Take 2 capsules (200 mg total) by mouth 3 (three) times daily as needed for cough. 03/18/21   Ivette Loyal, NP  buPROPion (WELLBUTRIN XL) 150 MG 24 hr tablet TAKE 1 TABLET BY MOUTH EVERY DAY 01/16/20   Ardith Dark, MD  cholecalciferol (VITAMIN D) 1000 units tablet Take 2,000 Units by mouth daily.     [provider]  Codeine Polt-Chlorphen Polt ER (TUZISTRA XR) 14.7-2.8 MG/5ML SUER Take 5 mLs by mouth 2 (two) times daily as needed. 03/18/21   Ivette Loyal, NP  diclofenac (VOLTAREN) 75 MG EC tablet Take 1 tablet (75 mg total) by mouth 2 (two) times daily. 12/01/20   Mardella Layman, MD  fexofenadine (ALLEGRA) 60 MG tablet Take 1  tablet (60 mg total) by mouth 2 (two) times daily. 01/13/19   Wurst, [place], PA-C  gabapentin (NEURONTIN) 300 MG capsule TAKE 1 CAPSULE AT BEDTIME FOR 5 DAYS, IF TOLERATED INCREASE TO 1 CAPSULE TWICE A DAY. 07/06/19   [provider]  ocrelizumab (OCREVUS) 300 MG/10ML injection Inject 600 mg into the vein every 6 (six) months.  09/14/19   [provider]  predniSONE (STERAPRED UNI-PAK 21 TAB) 10 MG (21) TBPK tablet Take by mouth daily. Take 6 tabs by mouth daily  for 2 days, then 5 tabs for 2 days, then 4 tabs for 2 days, then 3 tabs for 2 days, 2 tabs for 2 days, then 1 tab by mouth daily for 2 days 06/19/22   Hans Eden, NP    Family History Family History  Problem Relation Age of Onset   Hypertension Other    Diabetes Other    Ulcers Mother    GER disease Mother    Asthma Mother    Heart attack Mother    Lupus Mother    Cancer Father        kidney   Drug abuse Father    GER disease Sister    Asthma  Brother    Birth defects Maternal Grandfather    Heart attack Maternal Grandfather    High blood pressure Maternal Grandfather    Stroke Maternal Grandfather    Autism Son    Learning disabilities Son     Social History Social History   Tobacco Use   Smoking status: Every Day    Packs/day: 0.25    Years: 2.00    Total pack years: 0.50    Types: Cigarettes   Smokeless tobacco: Never   Tobacco comments:    quit with preg  Vaping Use   Vaping Use: Never used  Substance Use Topics   Alcohol use: Not Currently   Drug use: Yes    Types: Marijuana    Comment: daily use     Allergies   Baclofen and Tizanidine   Review of Systems Review of Systems  Genitourinary:  Positive for pelvic pain.  All other systems reviewed and are negative.    Physical Exam Triage Vital Signs ED Triage Vitals  Enc Vitals Group     BP 07/28/22 1813 (!) 143/88     Pulse Rate 07/28/22 1813 91     Resp 07/28/22 1813 16     Temp 07/28/22 1813 99.3 F (37.4 C)     Temp Source 07/28/22 1813 Oral     SpO2 07/28/22 1812 97 %     Weight --      Height --      Head Circumference --      Peak Flow --      Pain Score 07/28/22 1811 3     Pain Loc --      Pain Edu? --      Excl. in [place]? --    No data found.  Updated Vital Signs BP (!) 143/88 (BP Location: Right Arm)   Pulse 91   Temp 99.3 F (37.4 C) (Oral)   Resp 16   LMP 07/12/2022 (Exact Date)   SpO2 97%   Visual Acuity Right Eye Distance:   Left Eye Distance:   Bilateral Distance:    Right Eye Near:   Left Eye Near:    Bilateral Near:     Physical Exam Vitals reviewed.  Cardiovascular:     Rate and Rhythm: Normal rate.  Skin:    General: Skin is warm.  Neurological:     General: No focal deficit present.     Mental Status: She is alert.  Psychiatric:        Mood and Affect: Mood normal.      UC Treatments / Results  Labs (all labs ordered are listed, but only abnormal results are displayed) Labs Reviewed   CERVICOVAGINAL ANCILLARY ONLY    EKG   Radiology No results found.  Procedures Procedures (including critical care time)  Medications Ordered in UC Medications - No data to display  Initial Impression / Assessment and Plan / UC Course  I have reviewed the triage vital signs and the nursing notes.  Pertinent labs & imaging results that were available during my care of the patient were reviewed by me and considered in my medical decision making (see chart for details).     Gc and ct pending.  Pt given rx for diflucan. Final Clinical Impressions(s) / UC Diagnoses   Final diagnoses:  Yeast vaginitis   Discharge Instructions   None    ED Prescriptions   None    PDMP not reviewed this encounter. An After Visit Summary was printed and given to the patient.    Elson Areas, [place] 07/28/22 1843

## 2022-07-29 LAB — CERVICOVAGINAL ANCILLARY ONLY
Bacterial Vaginitis (gardnerella): NEGATIVE
Candida Glabrata: NEGATIVE
Candida Vaginitis: POSITIVE — AB
Chlamydia: NEGATIVE
Comment: NEGATIVE
Comment: NEGATIVE
Comment: NEGATIVE
Comment: NEGATIVE
Comment: NEGATIVE
Comment: NORMAL
Neisseria Gonorrhea: NEGATIVE
Trichomonas: NEGATIVE

## 2022-08-29 ENCOUNTER — Ambulatory Visit
Admission: EM | Admit: 2022-08-29 | Discharge: 2022-08-29 | Disposition: A | Discharge status: Home / Self Care | Payer: Commercial Managed Care - HMO | Attending: Physician Assistant | Admitting: Physician Assistant

## 2022-08-29 ENCOUNTER — Ambulatory Visit (INDEPENDENT_AMBULATORY_CARE_PROVIDER_SITE_OTHER): Payer: Commercial Managed Care - HMO

## 2022-08-29 ENCOUNTER — Other Ambulatory Visit: Payer: Self-pay

## 2022-08-29 ENCOUNTER — Encounter: Payer: Self-pay | Admitting: Emergency Medicine

## 2022-08-29 DIAGNOSIS — R079 Chest pain, unspecified: Secondary | ICD-10-CM | POA: Diagnosis not present

## 2022-08-29 DIAGNOSIS — M546 Pain in thoracic spine: Secondary | ICD-10-CM | POA: Diagnosis not present

## 2022-08-29 MED ORDER — CYCLOBENZAPRINE HCL 10 MG PO TABS
10.0000 mg | ORAL_TABLET | Freq: Two times a day (BID) | ORAL | 0 refills | Status: DC | PRN
Start: 2022-08-29 — End: 2023-01-26

## 2022-08-29 MED ORDER — PREDNISONE 20 MG PO TABS: 40.0000 mg | ORAL_TABLET | Freq: Every day | ORAL | 0 refills | Status: AC

## 2022-08-29 NOTE — ED Triage Notes (Signed)
Pt here for left sided upper and mid back pain worse with inspiration x 2 days; pt sts URI sx recently

## 2022-08-29 NOTE — ED Provider Notes (Signed)
[facility]    CSN: [id number] Arrival date & time: 08/29/22  0919      History   Chief Complaint Chief Complaint  Patient presents with   Back Pain    HPI Samantha [name] is a 36 y.o. female.   Patient here today for evaluation of left-sided thoracic back pain that is worse with inspiration and cough.  She states pain is not present for 2 days.  She reports that she did have upper respiratory infection recently.  She denies any shortness of breath otherwise.  She has not had any fever.  The history is provided by the patient.  Back Pain Associated symptoms: no abdominal pain, no chest pain and no fever     Past Medical History:  Diagnosis Date   Arthritis    Chicken pox    Chlamydia 12/02/2011   Depression    Fracture of left leg 2009   removal metal plate in left leg [id number]   Frequent headaches    Infection    UTI   Kidney stone    Kidney stone   Migraines    Multiple sclerosis ([place])    receives care @ [facility]   Neuropathy    PCOS (polycystic ovarian syndrome)    Tobacco abuse     Patient Active Problem List   Diagnosis Date Noted   Adjustment disorder with mixed anxiety and depressed mood 07/07/2020   Family discord 07/07/2020   Personal history of nonsuicidal self-harm 07/07/2020   Prediabetes 12/20/2018   Dyslipidemia 12/20/2018   Depression, major, single episode, moderate ([place]) 12/19/2018   Anxiety 12/19/2018   MS (multiple sclerosis) ([place]) 12/16/2017   Nicotine dependence with current use     Past Surgical History:  Procedure Laterality Date   CESAREAN SECTION N/A 05/12/2013   Procedure: Primary cesarean section with delivery of baby boy at 0754.;  Surgeon: Farrel Gobble. Harrington Challenger, MD;  Location: [facility];  Service: Obstetrics;  Laterality: N/A;   REMOVAL OF IMPLANT Left 09/25/2015   Procedure: LEFT ANKLE REMOVAL OF DEEP  IMPLANT DEBRIDEMENT OF ANTERIOR MEDIAL ANKLE JOINT;  Surgeon: Wylene Simmer, MD;  Location: [facility];   Service: Orthopedics;  Laterality: Left;   TIBIA FRACTURE SURGERY      OB History     Gravida  1   Para  1   Term  1   Preterm      AB      Living  1      SAB      IAB      Ectopic      Multiple      Live Births  1            Home Medications    Prior to Admission medications   Medication Sig Start Date End Date Taking? Authorizing Provider  cyclobenzaprine (FLEXERIL) 10 MG tablet Take 1 tablet (10 mg total) by mouth 2 (two) times daily as needed for muscle spasms. 08/29/22  Yes Francene Finders, PA-C  predniSONE (DELTASONE) 20 MG tablet Take 2 tablets (40 mg total) by mouth daily with breakfast for 5 days. 08/29/22 09/03/22 Yes Francene Finders, PA-C  benzonatate (TESSALON PERLES) 100 MG capsule Take 2 capsules (200 mg total) by mouth 3 (three) times daily as needed for cough. 03/18/21   Pearson Forster, NP  buPROPion (WELLBUTRIN XL) 150 MG 24 hr tablet TAKE 1 TABLET BY MOUTH EVERY DAY 01/16/20   Vivi Barrack, MD  cholecalciferol (  VITAMIN D) 1000 units tablet Take 2,000 Units by mouth daily.     [provider]  Codeine Polt-Chlorphen Polt ER (TUZISTRA XR) 14.7-2.8 MG/5ML SUER Take 5 mLs by mouth 2 (two) times daily as needed. 03/18/21   Pearson Forster, NP  diclofenac (VOLTAREN) 75 MG EC tablet Take 1 tablet (75 mg total) by mouth 2 (two) times daily. 12/01/20   Vanessa Kick, MD  fexofenadine (ALLEGRA) 60 MG tablet Take 1 tablet (60 mg total) by mouth 2 (two) times daily. 01/13/19   Wurst, [place], PA-C  fluconazole (DIFLUCAN) 150 MG tablet Take 1 tablet (150 mg total) by mouth daily. 07/28/22   Fransico Meadow, PA-C  gabapentin (NEURONTIN) 300 MG capsule TAKE 1 CAPSULE AT BEDTIME FOR 5 DAYS, IF TOLERATED INCREASE TO 1 CAPSULE TWICE A DAY. 07/06/19   [provider]  ocrelizumab (OCREVUS) 300 MG/10ML injection Inject 600 mg into the vein every 6 (six) months.  09/14/19   [provider]    Family History Family History  Problem  Relation Age of Onset   Hypertension Other    Diabetes Other    Ulcers Mother    GER disease Mother    Asthma Mother    Heart attack Mother    Lupus Mother    Cancer Father        kidney   Drug abuse Father    GER disease Sister    Asthma Brother    Birth defects Maternal Grandfather    Heart attack Maternal Grandfather    High blood pressure Maternal Grandfather    Stroke Maternal Grandfather    Autism Son    Learning disabilities Son     Social History Social History   Tobacco Use   Smoking status: Every Day    Packs/day: 0.25    Years: 2.00    Total pack years: 0.50    Types: Cigarettes   Smokeless tobacco: Never   Tobacco comments:    quit with preg  Vaping Use   Vaping Use: Never used  Substance Use Topics   Alcohol use: Not Currently   Drug use: Yes    Types: Marijuana    Comment: daily use     Allergies   Baclofen and Tizanidine   Review of Systems Review of Systems  Constitutional:  Negative for chills and fever.  HENT:  Negative for congestion.   Eyes:  Negative for discharge and redness.  Respiratory:  Positive for cough. Negative for shortness of breath.   Cardiovascular:  Negative for chest pain.  Gastrointestinal:  Negative for abdominal pain, nausea and vomiting.  Musculoskeletal:  Positive for back pain and myalgias.     Physical Exam Triage Vital Signs ED Triage Vitals  Enc Vitals Group     BP 08/29/22 1130 110/77     Pulse Rate 08/29/22 1130 70     Resp 08/29/22 1130 14     Temp 08/29/22 1130 97.9 F (36.6 C)     Temp Source 08/29/22 1130 Oral     SpO2 08/29/22 1130 97 %     Weight --      Height --      Head Circumference --      Peak Flow --      Pain Score 08/29/22 1147 8     Pain Loc --      Pain Edu? --      Excl. in [place]? --    No data found.  Updated Vital Signs BP  110/77 (BP Location: Left Arm)   Pulse 70   Temp 97.9 F (36.6 C) (Oral)   Resp 14   SpO2 97%      Physical Exam Vitals and nursing note  reviewed.  Constitutional:      General: She is not in acute distress.    Appearance: Normal appearance. She is not ill-appearing.  HENT:     Head: Normocephalic and atraumatic.  Eyes:     Conjunctiva/sclera: Conjunctivae normal.  Cardiovascular:     Rate and Rhythm: Normal rate and regular rhythm.     Pulses: Normal pulses.     Heart sounds: No murmur heard. Pulmonary:     Effort: Pulmonary effort is normal. No respiratory distress.     Breath sounds: Normal breath sounds. No wheezing, rhonchi or rales.  Chest:     Chest wall: Tenderness (TTP noted to left thoracic back) present.  Neurological:     Mental Status: She is alert.  Psychiatric:        Mood and Affect: Mood normal.        Behavior: Behavior normal.        Thought Content: Thought content normal.      UC Treatments / Results  Labs (all labs ordered are listed, but only abnormal results are displayed) Labs Reviewed - No data to display  EKG   Radiology No results found.  Procedures Procedures (including critical care time)  Medications Ordered in UC Medications - No data to display  Initial Impression / Assessment and Plan / UC Course  I have reviewed the triage vital signs and the nursing notes.  Pertinent labs & imaging results that were available during my care of the patient were reviewed by me and considered in my medical decision making (see chart for details).    Steroid burst and muscle relaxer prescribed for suspected musculoskeletal strain.  Advised incentive spirometry to help prevent pneumonia.  Chest x-ray within normal limits.  Recommended further evaluation if symptoms do not clear or with any further concerns.  Final Clinical Impressions(s) / UC Diagnoses   Final diagnoses:  Acute left-sided thoracic back pain   Discharge Instructions   None    ED Prescriptions     Medication Sig Dispense Auth. Provider   predniSONE (DELTASONE) 20 MG tablet Take 2 tablets (40 mg total) by  mouth daily with breakfast for 5 days. 10 tablet Erma Pinto F, PA-C   cyclobenzaprine (FLEXERIL) 10 MG tablet Take 1 tablet (10 mg total) by mouth 2 (two) times daily as needed for muscle spasms. 20 tablet Tomi Bamberger, PA-C      PDMP not reviewed this encounter.   Tomi Bamberger, PA-C 08/29/22 1331

## 2023-01-26 ENCOUNTER — Ambulatory Visit (HOSPITAL_COMMUNITY)
Admission: RE | Admit: 2023-01-26 | Discharge: 2023-01-26 | Disposition: A | Discharge status: Home / Self Care | Payer: Medicaid Other | Source: Ambulatory Visit

## 2023-01-26 ENCOUNTER — Encounter (HOSPITAL_COMMUNITY): Payer: Self-pay

## 2023-01-26 VITALS — BP 142/89 | HR 75 | Temp 98.6°F | Resp 18

## 2023-01-26 DIAGNOSIS — R030 Elevated blood-pressure reading, without diagnosis of hypertension: Secondary | ICD-10-CM

## 2023-01-26 DIAGNOSIS — R300 Dysuria: Secondary | ICD-10-CM

## 2023-01-26 LAB — POCT URINALYSIS DIP (MANUAL ENTRY)
Bilirubin, UA: NEGATIVE
Glucose, UA: NEGATIVE mg/dL
Ketones, POC UA: NEGATIVE mg/dL
Nitrite, UA: NEGATIVE
Protein Ur, POC: NEGATIVE mg/dL
Spec Grav, UA: 1.02
Urobilinogen, UA: 0.2 U/dL
pH, UA: 7

## 2023-01-26 MED ORDER — CEPHALEXIN 500 MG PO CAPS: 500.0000 mg | ORAL_CAPSULE | Freq: Two times a day (BID) | ORAL | 0 refills | Status: AC

## 2023-01-26 NOTE — ED Provider Notes (Signed)
[facility]    CSN: [id number] Arrival date & time: 01/26/23  1837      History   Chief Complaint Chief Complaint  Patient presents with   Urinary Tract Infection    HPI Samantha [name] is a 37 y.o. female presents urgent care today with complaints of burning with urination and low back pain x 3 days.  Patient states symptoms similar to UTIs in the past.  States she noticed scant amount of blood on toilet tissue this morning.  She denies any recent fever, chills, gross hematuria.  Patient also reports remote history of kidney stones.  Denies any history of hypertension though states she is quite uncomfortable with current symptoms.    Past Medical History:  Diagnosis Date   Arthritis    Chicken pox    Chlamydia 12/02/2011   Depression    Fracture of left leg 2009   removal metal plate in left leg 2016   Frequent headaches    Infection    UTI   Kidney stone    Kidney stone   Migraines    Multiple sclerosis    receives care @ [facility]   Neuropathy    PCOS (polycystic ovarian syndrome)    Tobacco abuse     Patient Active Problem List   Diagnosis Date Noted   Adjustment disorder with mixed anxiety and depressed mood 07/07/2020   Family discord 07/07/2020   Personal history of nonsuicidal self-harm 07/07/2020   Prediabetes 12/20/2018   Dyslipidemia 12/20/2018   Depression, major, single episode, moderate 12/19/2018   Anxiety 12/19/2018   MS (multiple sclerosis) 12/16/2017   Nicotine dependence with current use     Past Surgical History:  Procedure Laterality Date   CESAREAN SECTION N/A 05/12/2013   Procedure: Primary cesarean section with delivery of baby boy at 0754.;  Surgeon: Freddrick March. Tenny Craw, MD;  Location: [facility];  Service: Obstetrics;  Laterality: N/A;   REMOVAL OF IMPLANT Left 09/25/2015   Procedure: LEFT ANKLE REMOVAL OF DEEP  IMPLANT DEBRIDEMENT OF ANTERIOR MEDIAL ANKLE JOINT;  Surgeon: Toni Arthurs, MD;  Location: [facility];   Service: Orthopedics;  Laterality: Left;   TIBIA FRACTURE SURGERY      OB History     Gravida  1   Para  1   Term  1   Preterm      AB      Living  1      SAB      IAB      Ectopic      Multiple      Live Births  1            Home Medications    Prior to Admission medications   Medication Sig Start Date End Date Taking? Authorizing Provider  cephALEXin (KEFLEX) 500 MG capsule Take 1 capsule (500 mg total) by mouth 2 (two) times daily for 7 days. 01/26/23 02/02/23 Yes Rolla Etienne, NP  cholecalciferol (VITAMIN D) 1000 units tablet Take 2,000 Units by mouth daily.     [provider]  ocrelizumab (OCREVUS) 300 MG/10ML injection Inject into the vein.    [provider]    Family History Family History  Problem Relation Age of Onset   Hypertension Other    Diabetes Other    Ulcers Mother    GER disease Mother    Asthma Mother    Heart attack Mother    Lupus Mother    Cancer Father  kidney   Drug abuse Father    GER disease Sister    Asthma Brother    Birth defects Maternal Grandfather    Heart attack Maternal Grandfather    High blood pressure Maternal Grandfather    Stroke Maternal Grandfather    Autism Son    Learning disabilities Son     Social History Social History   Tobacco Use   Smoking status: Every Day    Packs/day: 0.25    Years: 2.00    Additional pack years: 0.00    Total pack years: 0.50    Types: Cigarettes   Smokeless tobacco: Never   Tobacco comments:    quit with preg  Vaping Use   Vaping Use: Never used  Substance Use Topics   Alcohol use: Not Currently   Drug use: Yes    Types: Marijuana    Comment: daily use     Allergies   Baclofen and Tizanidine   Review of Systems As stated in HPI otherwise negative   Physical Exam Triage Vital Signs ED Triage Vitals  Enc Vitals Group     BP 01/26/23 1907 (!) 142/89     Pulse Rate 01/26/23 1907 75     Resp 01/26/23 1907 18     Temp  01/26/23 1907 98.6 F (37 C)     Temp Source 01/26/23 1907 Oral     SpO2 01/26/23 1907 97 %     Weight --      Height --      Head Circumference --      Peak Flow --      Pain Score 01/26/23 1908 5     Pain Loc --      Pain Edu? --      Excl. in [place]? --    No data found.  Updated Vital Signs BP (!) 142/89 (BP Location: Left Arm)   Pulse 75   Temp 98.6 F (37 C) (Oral)   Resp 18   LMP 01/09/2023   SpO2 97%   Visual Acuity Right Eye Distance:   Left Eye Distance:   Bilateral Distance:    Right Eye Near:   Left Eye Near:    Bilateral Near:     Physical Exam Constitutional:      General: She is not in acute distress.    Appearance: Normal appearance. She is obese. She is not ill-appearing or toxic-appearing.  HENT:     Mouth/Throat:     Mouth: Mucous membranes are moist.  Pulmonary:     Effort: Pulmonary effort is normal.  Abdominal:     Palpations: Abdomen is soft.     Tenderness: There is no abdominal tenderness. There is no right CVA tenderness, left CVA tenderness, guarding or rebound.  Skin:    General: Skin is warm and dry.  Neurological:     General: No focal deficit present.     Mental Status: She is alert and oriented to person, place, and time.  Psychiatric:        Mood and Affect: Mood normal.        Behavior: Behavior normal.      UC Treatments / Results  Labs (all labs ordered are listed, but only abnormal results are displayed) Labs Reviewed  POCT URINALYSIS DIP (MANUAL ENTRY) - Abnormal; Notable for the following components:      Result Value   Blood, UA trace-intact (*)    Leukocytes, UA Trace (*)    All other components within normal  limits  URINE CULTURE    EKG   Radiology No results found.  Procedures Procedures (including critical care time)  Medications Ordered in UC Medications - No data to display  Initial Impression / Assessment and Plan / UC Course  I have reviewed the triage vital signs and the nursing  notes.  Pertinent labs & imaging results that were available during my care of the patient were reviewed by me and considered in my medical decision making (see chart for details).  Dysuria Elevated blood pressure reading -Patient reports similar symptoms with UTIs in the past and feels pain with urination.  Urine dip shows trace leukocytes and hemoglobin.  No evidence of systemic illness as patient is afebrile, nontoxic-appearing. -Will treat empirically with Keflex twice daily x 7 days.  Will send urine for culture -Strict ED precautions discussed with patient  Reviewed expections re: course of current medical issues. Questions answered. Outlined signs and symptoms indicating need for more acute intervention. Pt verbalized understanding. AVS given  Final Clinical Impressions(s) / UC Diagnoses   Final diagnoses:  Dysuria     Discharge Instructions      As we discussed, I am going to go ahead and treat you for a urinary tract infection.  Go ahead and start antibiotic tonight.  Increase fluids.  Should you develop worsening pain or any fever or chills you would need to be seen likely in the ER.     ED Prescriptions     Medication Sig Dispense Auth. Provider   cephALEXin (KEFLEX) 500 MG capsule Take 1 capsule (500 mg total) by mouth 2 (two) times daily for 7 days. 14 capsule Rolla Etienne, NP      PDMP not reviewed this encounter.   Rolla Etienne, NP 01/26/23 [phone]

## 2023-01-26 NOTE — ED Triage Notes (Signed)
Pt c/o burning on urination with urgency and frequency since Monday. C/o lt sided back pain yesterday. States taking tylenol with no relief.

## 2023-01-26 NOTE — Discharge Instructions (Signed)
As we discussed, I am going to go ahead and treat you for a urinary tract infection.  Go ahead and start antibiotic tonight.  Increase fluids.  Should you develop worsening pain or any fever or chills you would need to be seen likely in the ER.

## 2023-01-28 LAB — URINE CULTURE: Culture: >=100000 — AB

## 2023-11-26 ENCOUNTER — Other Ambulatory Visit: Payer: Self-pay

## 2023-11-26 ENCOUNTER — Emergency Department (HOSPITAL_BASED_OUTPATIENT_CLINIC_OR_DEPARTMENT_OTHER): Payer: Self-pay

## 2023-11-26 ENCOUNTER — Emergency Department (HOSPITAL_BASED_OUTPATIENT_CLINIC_OR_DEPARTMENT_OTHER)
Admission: EM | Admit: 2023-11-26 | Discharge: 2023-11-26 | Disposition: A | Discharge status: Home / Self Care | Payer: Self-pay | Attending: Emergency Medicine | Admitting: Emergency Medicine

## 2023-11-26 ENCOUNTER — Encounter (HOSPITAL_BASED_OUTPATIENT_CLINIC_OR_DEPARTMENT_OTHER): Payer: Self-pay | Admitting: Emergency Medicine

## 2023-11-26 DIAGNOSIS — W000XXA Fall on same level due to ice and snow, initial encounter: Secondary | ICD-10-CM | POA: Insufficient documentation

## 2023-11-26 DIAGNOSIS — M25531 Pain in right wrist: Secondary | ICD-10-CM | POA: Insufficient documentation

## 2023-11-26 NOTE — ED Triage Notes (Signed)
 Right wrist pain  Reports fall 2 months ago with continues wrist pain

## 2023-11-26 NOTE — ED Notes (Signed)

## 2023-11-26 NOTE — Discharge Instructions (Signed)
 Your wrist x-ray does not show any signs of a fracture or dislocation.  Please follow-up with the orthopedic office listed below if you continue to have wrist pain.  You may also benefit from physical therapy.  You may self refer, or get a referral from your PCP or orthopedic provider.  You may take up to 1000mg  of tylenol every 6 hours as needed for pain.  Do not take more then 4g per day.  Return to ER if you have any new or concerning symptoms.

## 2023-11-26 NOTE — ED Provider Notes (Signed)
  EMERGENCY DEPARTMENT AT [facility] Provider Note   CSN: [id number] Arrival date & time: 11/26/23  1518     History  Chief Complaint  Patient presents with   Wrist Pain    Samantha [name] is a 38 y.o. female with history of multiple sclerosis, presents with concern for right wrist pain after slipping and falling on ice about 2 months ago.  States she caught herself with her hand, and then continued to have pain in her right wrist ever since.  She is able to move her wrist without difficulty, just with some pain.  Denies any numbness or tingling in her fingers.   Wrist Pain       Home Medications Prior to Admission medications   Medication Sig Start Date End Date Taking? Authorizing Provider  cholecalciferol (VITAMIN D) 1000 units tablet Take 2,000 Units by mouth daily.     [provider]  ocrelizumab (OCREVUS) 300 MG/10ML injection Inject into the vein.    [provider]      Allergies    Baclofen and Tizanidine    Review of Systems   Review of Systems  Musculoskeletal:        Right wrist pain    Physical Exam Updated Vital Signs BP 114/83 (BP Location: Right Arm)   Pulse 92   Temp 98.6 F (37 C) (Oral)   Resp 18   Wt 104.3 kg   LMP 11/16/2023 (Approximate)   SpO2 98%   BMI 37.12 kg/m  Physical Exam Vitals and nursing note reviewed.  Constitutional:      Appearance: Normal appearance.  HENT:     Head: Atraumatic.  Pulmonary:     Effort: Pulmonary effort is normal.  Musculoskeletal:     Comments: Right upper extremity:  General No obvious deformity. No erythema, edema, contusions, open wounds   Palpation Mild tenderness palpation along the distal ulna.  No point tenderness palpation.  No tenderness to palpation of the anatomic snuff box. No tenderness palpation of the proximal ulna, or radius.  No tenderness palpation of the carpal bones diffusely, 1st through 5th metacarpals, first and fifth phalanges of the  right hand  ROM Fully flexes and extends right wrist and elbow without difficulty.  Able to flex and extend at the MCPs, PIP, DIPs of the right hand 1st through 5th digits  Sensation: Sensation intact throughout the upper extremity  Strength: 5/5 strength with resisted wrist flexion and extension    Neurological:     General: No focal deficit present.     Mental Status: She is alert.  Psychiatric:        Mood and Affect: Mood normal.        Behavior: Behavior normal.     ED Results / Procedures / Treatments   Labs (all labs ordered are listed, but only abnormal results are displayed) Labs Reviewed - No data to display  EKG None  Radiology DG Wrist Complete Right Result Date: 11/26/2023 CLINICAL DATA:  DWB-DGinjury pain EXAM: RIGHT WRIST - COMPLETE 3+ VIEW COMPARISON:  None Available. FINDINGS: No distal radius or ulnar fracture. Radiocarpal joint is intact. No carpal fracture. No soft tissue abnormality. IMPRESSION: No fracture or dislocation. Electronically Signed   By: Genevive Bi M.D.   On: 11/26/2023 16:13    Procedures Procedures    Medications Ordered in ED Medications - No data to display  ED Course/ Medical Decision Making/ A&P  Medical Decision Making Amount and/or Complexity of Data Reviewed Radiology: ordered.     Differential diagnosis includes but is not limited to fracture, disc location, tendonitis, sprain  ED Course:  Patient presents with pain to the distal right ulna ongoing for past 2 months after she had a slip and fall.  Has full range of motion of the right wrist, neurovascular intact in the right upper extremity.  She does not have any point tenderness to palpation, but reports some mild tenderness to the distal aspect of the right ulna.  X-rays of the right wrist without any acute fracture or dislocation.  Suspect she may have sustained a sprain to the right wrist.  Given this has been ongoing for 2  months, do not feel she would benefit from any splint or brace at this time. Recommended she follow-up with orthopedics for further management and possible PT referral   Stable and appropriate for discharge home  Impression: Right wrist pain  Disposition:  The patient was discharged home with instructions to follow-up with orthopedics for further management and physical therapy referral.  Tylenol and ibuprofen as needed for pain. Return precautions given.  Imaging Studies ordered: I ordered imaging studies including x-ray of right wrist I independently visualized the imaging with scope of interpretation limited to determining acute life threatening conditions related to emergency care. Imaging showed no acute fracture or dislocation I agree with the radiologist interpretation               Final Clinical Impression(s) / ED Diagnoses Final diagnoses:  Right wrist pain    Rx / DC Orders ED Discharge Orders     None         Arabella Merles, PA-C 11/26/23 1630    Estelle June A, DO 11/27/23 1223

## 2024-03-14 ENCOUNTER — Ambulatory Visit (HOSPITAL_COMMUNITY)
Admission: EM | Admit: 2024-03-14 | Discharge: 2024-03-14 | Disposition: A | Discharge status: Home / Self Care | Attending: Internal Medicine | Admitting: Internal Medicine

## 2024-03-14 ENCOUNTER — Ambulatory Visit (INDEPENDENT_AMBULATORY_CARE_PROVIDER_SITE_OTHER)

## 2024-03-14 ENCOUNTER — Encounter (HOSPITAL_COMMUNITY): Payer: Self-pay | Admitting: *Deleted

## 2024-03-14 DIAGNOSIS — M25522 Pain in left elbow: Secondary | ICD-10-CM

## 2024-03-14 DIAGNOSIS — M7022 Olecranon bursitis, left elbow: Secondary | ICD-10-CM

## 2024-03-14 DIAGNOSIS — G35 Multiple sclerosis: Secondary | ICD-10-CM

## 2024-03-14 MED ORDER — PREDNISONE 20 MG PO TABS: 40.0000 mg | ORAL_TABLET | Freq: Every day | ORAL | 0 refills | Status: AC

## 2024-03-14 NOTE — Discharge Instructions (Addendum)
 Your x-rays show a bone spur of your left elbow without any other abnormalities.  Take prednisone  40 mg once a day for the next 5 days.  Take with food to avoid stomach upset. Do not take any ibuprofen  when you are taking prednisone  as these medications together can cause stomach upset.  Please schedule a follow-up appointment with the orthopedic provider listed on your paperwork.  If you develop any new or worsening symptoms or if your symptoms do not start to improve, please return here or follow-up with your primary care provider. If your symptoms are severe, please go to the emergency room.

## 2024-03-14 NOTE — ED Provider Notes (Signed)
 [facility]    CSN: [id number] Arrival date & time: 03/14/24  0844      History   Chief Complaint Chief Complaint  Patient presents with  . Elbow Pain    HPI Samantha [name] is a 38 y.o. female.   Samantha [name] is a 38 y.o. female presenting for chief complaint of left elbow pain that started 4 days ago. Pain to the left posterior elbow started the night after she went fishing during the day 4 days ago. She has noticed swelling associated with pain to the pad of the left elbow joint and reports minimal redness to the area as well. Pain is worsened by movement of the elbow and pressure on the elbow.  Denies previous injuries to the elbow. She's right handed, does not remember injuring the left elbow, and denies history of gout. History of MS, she wonders if this could be an MS flare. Typically MS flares affect the hips and not elbows or other joints of her body.  No recent fevers, chills, nausea, and vomiting. She has taken tylenol  and ibuprofen  with minimal temporary relief. She has not had any recent steroids.     Past Medical History:  Diagnosis Date  . Arthritis   . Chicken pox   . Chlamydia 12/02/2011  . Depression   . Fracture of left leg 2009   removal metal plate in left leg 2016  . Frequent headaches   . Infection    UTI  . Kidney stone    Kidney stone  . Migraines   . Multiple sclerosis (HCC)    receives care @ [facility]  . Neuropathy   . PCOS (polycystic ovarian syndrome)   . Tobacco abuse     Patient Active Problem List   Diagnosis Date Noted  . Adjustment disorder with mixed anxiety and depressed mood 07/07/2020  . Family discord 07/07/2020  . Personal history of nonsuicidal self-harm 07/07/2020  . Prediabetes 12/20/2018  . Dyslipidemia 12/20/2018  . Depression, major, single episode, moderate (HCC) 12/19/2018  . Anxiety 12/19/2018  . MS (multiple sclerosis) (HCC) 12/16/2017  . Nicotine dependence with current use     Past  Surgical History:  Procedure Laterality Date  . CESAREAN SECTION N/A 05/12/2013   Procedure: Primary cesarean section with delivery of baby boy at 0754.;  Surgeon: Elridge Haller. Avanell Bob, MD;  Location: [facility];  Service: Obstetrics;  Laterality: N/A;  . REMOVAL OF IMPLANT Left 09/25/2015   Procedure: LEFT ANKLE REMOVAL OF DEEP  IMPLANT DEBRIDEMENT OF ANTERIOR MEDIAL ANKLE JOINT;  Surgeon: Amada Backer, MD;  Location: [facility];  Service: Orthopedics;  Laterality: Left;  . TIBIA FRACTURE SURGERY      OB History     Gravida  1   Para  1   Term  1   Preterm      AB      Living  1      SAB      IAB      Ectopic      Multiple      Live Births  1            Home Medications    Prior to Admission medications   Medication Sig Start Date End Date Taking? Authorizing Provider  predniSONE  (DELTASONE ) 20 MG tablet Take 2 tablets (40 mg total) by mouth daily with breakfast for 5 days. 03/14/24 03/19/24 Yes Starlene Eaton, FNP  cholecalciferol (VITAMIN D ) 1000 units tablet Take 2,000  Units by mouth daily.     [provider]  ocrelizumab (OCREVUS) 300 MG/10ML injection Inject into the vein.    [provider]    Family History Family History  Problem Relation Age of Onset  . Hypertension Other   . Diabetes Other   . Ulcers Mother   . GER disease Mother   . Asthma Mother   . Heart attack Mother   . Lupus Mother   . Cancer Father        kidney  . Drug abuse Father   . GER disease Sister   . Asthma Brother   . Birth defects Maternal Grandfather   . Heart attack Maternal Grandfather   . High blood pressure Maternal Grandfather   . Stroke Maternal Grandfather   . Autism Son   . Learning disabilities Son     Social History Social History   Tobacco Use  . Smoking status: Every Day    Current packs/day: 0.25    Average packs/day: 0.3 packs/day for 2.0 years (0.5 ttl pk-yrs)    Types: Cigarettes  . Smokeless tobacco: Never  .  Tobacco comments:    quit with preg  Vaping Use  . Vaping status: Never Used  Substance Use Topics  . Alcohol use: Not Currently  . Drug use: Yes    Types: Marijuana    Comment: daily use     Allergies   Baclofen  and Tizanidine    Review of Systems Review of Systems Per HPI  Physical Exam Triage Vital Signs ED Triage Vitals  Encounter Vitals Group     BP 03/14/24 0927 (!) 147/92     Systolic BP Percentile --      Diastolic BP Percentile --      Pulse Rate 03/14/24 0927 69     Resp 03/14/24 0927 18     Temp 03/14/24 0927 (!) 97.1 F (36.2 C)     Temp Source 03/14/24 0927 Oral     SpO2 03/14/24 0927 98 %     Weight --      Height --      Head Circumference --      Peak Flow --      Pain Score 03/14/24 0928 8     Pain Loc --      Pain Education --      Exclude from Growth Chart --    No data found.  Updated Vital Signs BP (!) 147/92 (BP Location: Right Arm)   Pulse 69   Temp (!) 97.1 F (36.2 C) (Oral)   Resp 18   LMP 02/21/2024 (Exact Date)   SpO2 98%   Visual Acuity Right Eye Distance:   Left Eye Distance:   Bilateral Distance:    Right Eye Near:   Left Eye Near:    Bilateral Near:     Physical Exam Vitals and nursing note reviewed.  Constitutional:      Appearance: She is not ill-appearing or toxic-appearing.  HENT:     Head: Normocephalic and atraumatic.     Right Ear: Hearing and external ear normal.     Left Ear: Hearing and external ear normal.     Nose: Nose normal.     Mouth/Throat:     Lips: Pink.   Eyes:     General: Lids are normal. Vision grossly intact. Gaze aligned appropriately.     Extraocular Movements: Extraocular movements intact.     Conjunctiva/sclera: Conjunctivae normal.   Pulmonary:     Effort:  Pulmonary effort is normal.   Musculoskeletal:     Left shoulder: Normal.     Left upper arm: Normal.     Left elbow: Swelling present. No deformity or effusion. Decreased range of motion. Tenderness present in olecranon  process.     Right forearm: Normal.     Left forearm: Normal.     Cervical back: Neck supple.   Skin:    General: Skin is warm and dry.     Capillary Refill: Capillary refill takes less than 2 seconds.     Findings: No rash.   Neurological:     General: No focal deficit present.     Mental Status: She is alert and oriented to person, place, and time. Mental status is at baseline.     Cranial Nerves: No dysarthria or facial asymmetry.   Psychiatric:        Mood and Affect: Mood normal.        Speech: Speech normal.        Behavior: Behavior normal.        Thought Content: Thought content normal.        Judgment: Judgment normal.     UC Treatments / Results  Labs (all labs ordered are listed, but only abnormal results are displayed) Labs Reviewed - No data to display  EKG   Radiology CLINICAL DATA:  Pain and swelling   EXAM: LEFT ELBOW - COMPLETE 3+ VIEW   COMPARISON:  None Available.   FINDINGS: There is no evidence of fracture, dislocation, or joint effusion. There is no evidence of arthropathy or other focal bone abnormality. Soft tissues are unremarkable.   IMPRESSION: Negative.     Electronically Signed   By: Fredrich Jefferson M.D.   On: 03/14/2024 11:05  Procedures Procedures (including critical care time)  Medications Ordered in UC Medications - No data to display  Initial Impression / Assessment and Plan / UC Course  I have reviewed the triage vital signs and the nursing notes.  Pertinent labs & imaging results that were available during my care of the patient were reviewed by me and considered in my medical decision making (see chart for details).   1. Left elbow pain, olecranon bursitis, MS Left elbow pain likely secondary to olecranon bursitis versus MS flare. Low suspicious for gouty arthritis, cellulitis, and septic arthritis.  Will manage this with prednisone  burst as symptoms have not responded well to use of NSAIDs at home. RICE recommended.   Work note given.  Follow-up with orthopedic specialist listed on paperwork PRN.   Counseled patient on potential for adverse effects with medications prescribed/recommended today, strict ER and return-to-clinic precautions discussed, patient verbalized understanding.    Final Clinical Impressions(s) / UC Diagnoses   Final diagnoses:  Left elbow pain  Olecranon bursitis of left elbow  Multiple sclerosis (HCC)     Discharge Instructions      Your x-rays show a bone spur of your left elbow without any other abnormalities.  Take prednisone  40 mg once a day for the next 5 days.  Take with food to avoid stomach upset. Do not take any ibuprofen  when you are taking prednisone  as these medications together can cause stomach upset.  Please schedule a follow-up appointment with the orthopedic provider listed on your paperwork.  If you develop any new or worsening symptoms or if your symptoms do not start to improve, please return here or follow-up with your primary care provider. If your symptoms are severe, please go to  the emergency room.     ED Prescriptions     Medication Sig Dispense Auth. Provider   predniSONE  (DELTASONE ) 20 MG tablet Take 2 tablets (40 mg total) by mouth daily with breakfast for 5 days. 10 tablet Starlene Eaton, FNP      PDMP not reviewed this encounter.   Starlene Eaton, [place] 03/16/24 1900

## 2024-03-14 NOTE — ED Triage Notes (Signed)
 Pt complained of left elbow pain and swelling since Saturday unknown injury, states could be MS flareup  pt states took tylenol  and motrin  with little relief

## 2024-05-07 ENCOUNTER — Emergency Department (HOSPITAL_COMMUNITY): Payer: PRIVATE HEALTH INSURANCE

## 2024-05-07 ENCOUNTER — Emergency Department (HOSPITAL_BASED_OUTPATIENT_CLINIC_OR_DEPARTMENT_OTHER)
Admission: EM | Admit: 2024-05-07 | Discharge: 2024-05-07 | Disposition: A | Discharge status: Home / Self Care | Payer: PRIVATE HEALTH INSURANCE | Attending: Emergency Medicine | Admitting: Emergency Medicine

## 2024-05-07 ENCOUNTER — Encounter (HOSPITAL_BASED_OUTPATIENT_CLINIC_OR_DEPARTMENT_OTHER): Payer: Self-pay | Admitting: Emergency Medicine

## 2024-05-07 ENCOUNTER — Other Ambulatory Visit: Payer: Self-pay

## 2024-05-07 DIAGNOSIS — G35 Multiple sclerosis: Secondary | ICD-10-CM | POA: Insufficient documentation

## 2024-05-07 DIAGNOSIS — R200 Anesthesia of skin: Secondary | ICD-10-CM | POA: Diagnosis present

## 2024-05-07 LAB — URINALYSIS, ROUTINE W REFLEX MICROSCOPIC
Bacteria, UA: NONE SEEN
Bilirubin Urine: NEGATIVE
Glucose, UA: NEGATIVE mg/dL
Hgb urine dipstick: NEGATIVE
Ketones, ur: NEGATIVE mg/dL
Leukocytes,Ua: NEGATIVE
Nitrite: NEGATIVE
Protein, ur: NEGATIVE mg/dL
Specific Gravity, Urine: 1.027 (ref 1.005–1.030)
pH: 5.5 (ref 5.0–8.0)

## 2024-05-07 LAB — COMPREHENSIVE METABOLIC PANEL WITH GFR
ALT: 7 U/L (ref 0–44)
AST: 15 U/L (ref 15–41)
Albumin: 4 g/dL (ref 3.5–5.0)
Alkaline Phosphatase: 96 U/L (ref 38–126)
Anion gap: 14 (ref 5–15)
BUN: 7 mg/dL (ref 6–20)
CO2: 22 mmol/L (ref 22–32)
Calcium: 9.4 mg/dL (ref 8.9–10.3)
Chloride: 101 mmol/L (ref 98–111)
Creatinine, Ser: 0.8 mg/dL (ref 0.44–1.00)
GFR, Estimated: >60 mL/min (ref >60)
Glucose, Bld: 87 mg/dL (ref 70–99)
Potassium: 3.6 mmol/L (ref 3.5–5.1)
Sodium: 138 mmol/L (ref 135–145)
Total Bilirubin: 0.7 mg/dL (ref 0.0–1.2)
Total Protein: 7.9 g/dL (ref 6.5–8.1)

## 2024-05-07 LAB — CBC WITH DIFFERENTIAL/PLATELET
Abs Immature Granulocytes: 0.02 K/uL (ref 0.00–0.07)
Basophils Absolute: 0.1 K/uL (ref 0.0–0.1)
Basophils Relative: 1 %
Eosinophils Absolute: 0.2 K/uL (ref 0.0–0.5)
Eosinophils Relative: 3 %
HCT: 43.4 % (ref 36.0–46.0)
Hemoglobin: 13.8 g/dL (ref 12.0–15.0)
Immature Granulocytes: 0 %
Lymphocytes Relative: 25 %
Lymphs Abs: 2.1 K/uL (ref 0.7–4.0)
MCH: 26.2 pg (ref 26.0–34.0)
MCHC: 31.8 g/dL (ref 30.0–36.0)
MCV: 82.5 fL (ref 80.0–100.0)
Monocytes Absolute: 0.5 K/uL (ref 0.1–1.0)
Monocytes Relative: 6 %
Neutro Abs: 5.4 K/uL (ref 1.7–7.7)
Neutrophils Relative %: 65 %
Platelets: 433 K/uL — ABNORMAL HIGH (ref 150–400)
RBC: 5.26 MIL/uL — ABNORMAL HIGH (ref 3.87–5.11)
RDW: 16.3 % — ABNORMAL HIGH (ref 11.5–15.5)
WBC: 8.4 K/uL (ref 4.0–10.5)
nRBC: 0 % (ref 0.0–0.2)

## 2024-05-07 LAB — PREGNANCY, URINE: Preg Test, Ur: NEGATIVE

## 2024-05-07 MED ORDER — MIDAZOLAM HCL 2 MG/2ML IJ SOLN
1.0000 mg | Freq: Once | INTRAMUSCULAR | Status: AC | PRN
  Administered 2024-05-07: 1 mg via INTRAVENOUS
  Filled 2024-05-07: qty 2

## 2024-05-07 MED ORDER — GADOBUTROL 1 MMOL/ML IV SOLN
10.0000 mL | Freq: Once | INTRAVENOUS | Status: AC | PRN
  Administered 2024-05-07: 10 mL via INTRAVENOUS

## 2024-05-07 NOTE — ED Notes (Signed)
 Pt stating she is leaving. Pt stating that she is tired of sitting in the hallway. Pt informed that she was sent for a test that takes longer. Pt told nurse to stop talking and take her IV out. MD made aware

## 2024-05-07 NOTE — ED Provider Notes (Signed)
 Care assumed from previous provider in transfer for MRI to r/o active MS flare.  38 year old history of MS.  Here for paresthesias of her feet and hands x weeks.  Not followed with neuro since 2023.  Plan on follow-up on MRI.  If new lesions will need admission Physical Exam  BP 122/82   Pulse 78   Temp 98.7 F (37.1 C) (Oral)   Resp 18   SpO2 100%   Physical Exam Vitals and nursing note reviewed.  Constitutional:      General: She is not in acute distress.    Appearance: She is well-developed. She is not ill-appearing or diaphoretic.  HENT:     Head: Atraumatic.  Cardiovascular:     Rate and Rhythm: Normal rate.  Pulmonary:     Effort: Pulmonary effort is normal. No respiratory distress.  Abdominal:     General: There is no distension.  Musculoskeletal:        General: Normal range of motion.  Neurological:     Mental Status: She is alert.     Gait: Gait normal.     Procedures  Procedures Labs Reviewed  CBC WITH DIFFERENTIAL/PLATELET - Abnormal; Notable for the following components:      Result Value   RBC 5.26 (*)    RDW 16.3 (*)    Platelets 433 (*)    All other components within normal limits  COMPREHENSIVE METABOLIC PANEL WITH GFR  URINALYSIS, ROUTINE W REFLEX MICROSCOPIC  PREGNANCY, URINE   MR THORACIC SPINE W WO CONTRAST Result Date: 05/07/2024 EXAM: MRI THORACIC SPINE WITH AND WITHOUT INTRAVENOUS CONTRAST 05/07/2024 08:23:45 PM TECHNIQUE: Multiplanar multisequence MRI of the thoracic spine was performed with and without the administration of intravenous contrast. COMPARISON: None available. CLINICAL HISTORY: Multiple sclerosis (MS). FINDINGS: BONES AND ALIGNMENT: Normal alignment. Normal vertebral body heights. Bone marrow signal is unremarkable. No abnormal enhancement. SPINAL CORD: Normal spinal cord volume. Normal spinal cord signal. The sagittal STIR sequence shows chronic demyelinating lesions at T2-3 and T5-6. No contrast-enhancing lesions. SOFT TISSUES:  Unremarkable. DEGENERATIVE CHANGES: No significant disc herniation. No spinal canal stenosis or neural foraminal narrowing. LIMITATIONS/ARTIFACTS: The axial images are degraded by motion which obscures the search for small demyelinating lesions. IMPRESSION: 1. Chronic demyelinating lesions at T2-3 and T5-6, without contrast enhancement. 2. Axial images degraded by motion, limiting evaluation for small demyelinating lesions. Electronically signed by: Franky Stanford MD 05/07/2024 10:02 PM EDT RP Workstation: [id number]   MR Cervical Spine W and Wo Contrast Result Date: 05/07/2024 EXAM: MRI CERVICAL SPINE WITH AND WITHOUT CONTRAST 05/07/2024 08:26:09 PM TECHNIQUE: Multiplanar multisequence MRI of the cervical spine was performed with and without intravenous contrast. COMPARISON: 07/05/2019 CLINICAL HISTORY: Multiple sclerosis (MS). FINDINGS: BONES AND ALIGNMENT: Normal alignment. Normal vertebral body heights. Bone marrow signal is unremarkable. SPINAL CORD: Hyperintense T2-weighted signal lesion within the right hemicord at the C2-3 level is unchanged. Left hemicord lesion at C4 is unchanged. Dorsal cord lesion at C2 is unchanged. There are no new lesions. No enhancing lesions. SOFT TISSUES: No paraspinal mass. C2-C3: No significant disc herniation. No spinal canal stenosis or neural foraminal narrowing. C3-C4: No significant disc herniation. No spinal canal stenosis or neural foraminal narrowing. C4-C5: No significant disc herniation. No spinal canal stenosis or neural foraminal narrowing. C5-C6: No significant disc herniation. No spinal canal stenosis or neural foraminal narrowing. C6-C7: No significant disc herniation. No spinal canal stenosis or neural foraminal narrowing. C7-T1: No significant disc herniation. No spinal canal stenosis or neural foraminal narrowing. IMPRESSION:  1. Stable chronic demyelinating lesions at C2, C3 and C4, without enhancement. 2. No new lesions. Electronically signed by: Franky Stanford  MD 05/07/2024 09:58 PM EDT RP Workstation: [id number]   MR Brain W and Wo Contrast Result Date: 05/07/2024 EXAM: MRI BRAIN WITH AND WITHOUT CONTRAST 05/07/2024 08:29:14 PM TECHNIQUE: Multiplanar multisequence MRI of the head/brain was performed with and without the administration of intravenous contrast. COMPARISON: 07/05/2019 CLINICAL HISTORY: Multiple sclerosis (MS). FINDINGS: BRAIN AND VENTRICLES: No acute infarct. No acute intracranial hemorrhage. No mass effect or midline shift. No hydrocephalus. The sella is unremarkable. Normal flow voids. No mass or abnormal enhancement. Multifocal hyperintense T2-weighted signal within the supratentorial white matter is unchanged. There are no new lesions, no volume loss, and no contrast-enhancing lesions. ORBITS: No acute abnormality. SINUSES: No acute abnormality. BONES AND SOFT TISSUES: Normal bone marrow signal and enhancement. No acute soft tissue abnormality. IMPRESSION: 1. Stable multifocal hyperintense T2-weighted signal within the supratentorial white matter, compatible with multiple sclerosis. 2. No new lesions, no volume loss, and no contrast-enhancing lesions. Electronically signed by: Franky Stanford MD 05/07/2024 09:53 PM EDT RP Workstation: [id number]   MR Lumbar Spine W Tommye Contrast Result Date: 05/07/2024 EXAM: MR Lumbar Spine without and with intravenous contrast. 05/07/2024 08:14:17 PM TECHNIQUE: Multiplanar multisequence MRI of the lumbar spine was performed without and with the administration of intravenous contrast. COMPARISON: 07/05/2019 CLINICAL HISTORY: Multiple sclerosis (MS). FINDINGS: BONES AND ALIGNMENT: Normal vertebral body heights. Normal bone marrow signal. No abnormal enhancement. Normal alignment. SPINAL CORD: The conus terminates normally. SOFT TISSUES: No acute abnormality. L1-L2: No disc herniation. No spinal canal stenosis or neural foraminal narrowing. L2-L3: No disc herniation. No spinal canal stenosis or neural foraminal narrowing.  L3-L4: No disc herniation. No spinal canal stenosis or neural foraminal narrowing. L4-L5: No disc herniation. No spinal canal stenosis or neural foraminal narrowing. L5-S1: No disc herniation. No spinal canal stenosis or neural foraminal narrowing. IMPRESSION: 1. No acute findings. Electronically signed by: Franky Stanford MD 05/07/2024 09:48 PM EDT RP Workstation: [id number]    ED Course / MDM   Clinical Course as of 05/07/24 2222  Mon May 07, 2024  2158 MR Lumbar Spine W Wo Contrast No acute findings [BH]  2158 MR Brain W and Wo Contrast Stable brain MR, no new lesions [BH]  2201 MR Cervical Spine W and Wo Contrast Stable chronic lesions, no new lesions [BH]    Clinical Course User Index [BH] Abagayle Klutts A, PA-C   Care assumed from previous provider in transfer for MRI to r/o active MS flare.  38 year old history of MS.  Here for paresthesias of her lower extremities and hands x weeks.  Not followed with neuro since 2023.  Plan on follow-up on MRI.  If new lesions will need admission.  Patient assessed. Standing across the room refusing to sit for examination. Discussed MRI findings. She is very upset that she has been in the ED for 9 hours. Not cooperative with history and refusing re-examination.  Patient eloped form the ED after my initial assessment. Ambulatory out the ED doors wo difficulty.  During my initial assessment I did discuss contacting her neurologist however she states that they are not able to get her in as she has not seen them for greater than 2 years.  I offered to make emergent referral to local neurologist here however patient declined    Medical Decision Making Amount and/or Complexity of Data Reviewed External Data Reviewed: labs, radiology and notes. Labs: ordered. Decision-making details documented  in ED Course. Radiology: ordered and independent interpretation performed. Decision-making details documented in ED Course.  Risk OTC drugs. Prescription  drug management. Decision regarding hospitalization. Diagnosis or treatment significantly limited by social determinants of health.          Kirill Chatterjee A, PA-C 05/07/24 2224    Tonia Chew, MD 05/08/24 [phone]

## 2024-05-07 NOTE — ED Notes (Addendum)
 Pt needing MRI. POV to [facility]. Accepted by Dr. Zavitz. Pt's VS stable for transfer.

## 2024-05-07 NOTE — Discharge Instructions (Addendum)
 As we discussed your MRI results were reassuring without active MS flare.  Make sure to follow up with your Neurologist

## 2024-05-07 NOTE — ED Provider Notes (Signed)
 [place] [facility] AT [facility] Provider Note   CSN: [id number] Arrival date & time: 05/07/24  1309     Patient presents with: Numbness   Samantha [name] is a 38 y.o. female.   Pt is a 38 yo female with pmhx significant for MS, PCOS, arthritis, depression, and tobacco abuse.  Pt has not seen a neurologist since 07/13/22.  She has not had any treatment since before then.  She had an injection of Ocrevus scheduled for November of 2023, but her insurance changed and she could not get it.  She has had progressive numbness to both legs and hands for 2 weeks.  She's had some vision loss, but that has resolved.  She's fallen a few times due to the numb legs.  She is able to walk.  She is able to drive.  She is concerned she's having a flare.        Prior to Admission medications   Medication Sig Start Date End Date Taking? Authorizing Provider  cholecalciferol (VITAMIN D ) 1000 units tablet Take 2,000 Units by mouth daily.     [provider]  ocrelizumab (OCREVUS) 300 MG/10ML injection Inject into the vein.    [provider]    Allergies: Baclofen  and Tizanidine     Review of Systems  Eyes:  Positive for visual disturbance.  Neurological:  Positive for weakness and numbness.  All other systems reviewed and are negative.   Updated Vital Signs BP (!) 141/86 (BP Location: Right Arm)   Pulse 73   Temp 98.7 F (37.1 C) (Oral)   Resp 18   SpO2 100%   Physical Exam Vitals and nursing note reviewed.  Constitutional:      Appearance: Normal appearance. She is obese.  HENT:     Head: Normocephalic and atraumatic.     Right Ear: External ear normal.     Left Ear: External ear normal.     Nose: Nose normal.     Mouth/Throat:     Mouth: Mucous membranes are moist.     Pharynx: Oropharynx is clear.  Eyes:     Extraocular Movements: Extraocular movements intact.     Conjunctiva/sclera: Conjunctivae normal.     Pupils: Pupils are equal,  round, and reactive to light.  Cardiovascular:     Rate and Rhythm: Normal rate and regular rhythm.     Pulses: Normal pulses.     Heart sounds: Normal heart sounds.  Pulmonary:     Effort: Pulmonary effort is normal.     Breath sounds: Normal breath sounds.  Abdominal:     General: Abdomen is flat. Bowel sounds are normal.     Palpations: Abdomen is soft.  Musculoskeletal:        General: Normal range of motion.     Cervical back: Normal range of motion and neck supple.  Skin:    General: Skin is warm.     Capillary Refill: Capillary refill takes less than 2 seconds.  Neurological:     General: No focal deficit present.     Mental Status: She is alert and oriented to person, place, and time.  Psychiatric:        Mood and Affect: Mood normal.        Behavior: Behavior normal.     (all labs ordered are listed, but only abnormal results are displayed) Labs Reviewed  COMPREHENSIVE METABOLIC PANEL WITH GFR  CBC WITH DIFFERENTIAL/PLATELET  URINALYSIS, ROUTINE W REFLEX MICROSCOPIC  PREGNANCY, URINE  EKG: None  Radiology: No results found.   Procedures   Medications Ordered in the ED - No data to display                                  Medical Decision Making Amount and/or Complexity of Data Reviewed Labs: ordered. Radiology: ordered.   This patient presents to the ED for concern of numbness, this involves an extensive number of treatment options, and is a complaint that carries with it a high risk of complications and morbidity.  The differential diagnosis includes ms flare, electrolyte abn, pregnancy, infection   Co morbidities that complicate the patient evaluation  MS, PCOS, arthritis, depression, and tobacco abuse.   Additional history obtained:  Additional history obtained from epic chart review  Lab Tests:  I Ordered, and personally interpreted labs.  Labs are pending at time of tx.   Imaging Studies ordered:  I ordered imaging studies  including MRI  Pending upon transfer  Medicines ordered and prescription drug management:   I have reviewed the patients home medicines and have made adjustments as needed   Test Considered:  mri  Problem List / ED Course:  Numbness:  sx concerning for MS flare.  We don't have MRI at [facility], so she will be transferred to [facility] via POV.  She has been accepted by Dr. Tonia.  If she has new lesions, she will need a neuro consult and admission for IV steroids.   Social Determinants of Health:  Lives at home   Dispostion:  After consideration of the diagnostic results and the patients response to treatment, I feel that the patent would benefit from transfer to [facility].       Final diagnoses:  MS (multiple sclerosis) [facility])    ED Discharge Orders     None          Dean Clarity, MD 05/07/24 [phone]

## 2024-05-07 NOTE — ED Triage Notes (Signed)
 C/o numbness in bilateral legs and hands x 2 weeks. States has been dx with MS and is concerned of symptoms getting worse. Dx since 2018.

## 2024-05-07 NOTE — ED Notes (Signed)
Pt called for vitals x2 with no reponse

## 2024-10-04 ENCOUNTER — Ambulatory Visit
Admission: EM | Admit: 2024-10-04 | Discharge: 2024-10-04 | Disposition: A | Discharge status: Home / Self Care | Payer: Self-pay | Attending: Emergency Medicine | Admitting: Emergency Medicine

## 2024-10-04 DIAGNOSIS — J111 Influenza due to unidentified influenza virus with other respiratory manifestations: Secondary | ICD-10-CM

## 2024-10-04 DIAGNOSIS — R051 Acute cough: Secondary | ICD-10-CM

## 2024-10-04 DIAGNOSIS — R52 Pain, unspecified: Secondary | ICD-10-CM

## 2024-10-04 MED ORDER — OSELTAMIVIR PHOSPHATE 75 MG PO CAPS: 75.0000 mg | ORAL_CAPSULE | Freq: Two times a day (BID) | ORAL | 0 refills | Status: AC

## 2024-10-04 MED ORDER — PREDNISONE 10 MG (21) PO TBPK: ORAL_TABLET | Freq: Every day | ORAL | 0 refills | Status: AC

## 2024-10-04 NOTE — ED Triage Notes (Signed)
 Pt states fever,cough,stuffy nose ,body aches for the past 6 days. States she has been taking dayquil and nyquil at home.

## 2024-10-04 NOTE — Discharge Instructions (Addendum)
 Patient will need to follow-up with her PCP on Monday If symptoms become worse shortness of breath chest pain patient will need to follow-up in the emergency room Stay hydrated drink plenty of fluids Take Tylenol  as needed for pain or fever Cautious with over-the-counter cold and cough medicine take this only as needed

## 2024-10-04 NOTE — ED Provider Notes (Signed)
 " [facility]    CSN: [id number] Arrival date & time: 10/04/24  1031      History   Chief Complaint Chief Complaint  Patient presents with   Fever    HPI Samantha [name] is a 39 y.o. female.   Patient presents today with fever cough congestion runny nose generalized body aches for the past 5 days.  Son tested positive for flu a approximately a week ago and she began to have symptoms.  Has been taking DayQuil and NyQuil at home with minimal relief.  Patient does have a history of MS which feels this causing the body aches to be worse.  Denies any chest pain no nausea vomiting or diarrhea.    Past Medical History:  Diagnosis Date   Arthritis    Chicken pox    Chlamydia 12/02/2011   Depression    Fracture of left leg 2009   removal metal plate in left leg 2016   Frequent headaches    Infection    UTI   Kidney stone    Kidney stone   Migraines    Multiple sclerosis    receives care @ [facility]   Neuropathy    PCOS (polycystic ovarian syndrome)    Tobacco abuse     Patient Active Problem List   Diagnosis Date Noted   Adjustment disorder with mixed anxiety and depressed mood 07/07/2020   Family discord 07/07/2020   Personal history of nonsuicidal self-harm 07/07/2020   Prediabetes 12/20/2018   Dyslipidemia 12/20/2018   Depression, major, single episode, moderate (HCC) 12/19/2018   Anxiety 12/19/2018   MS (multiple sclerosis) 12/16/2017   Nicotine dependence with current use     Past Surgical History:  Procedure Laterality Date   CESAREAN SECTION N/A 05/12/2013   Procedure: Primary cesarean section with delivery of baby boy at 0754.;  Surgeon: Marjorie DEL. Okey, MD;  Location: [facility];  Service: Obstetrics;  Laterality: N/A;   REMOVAL OF IMPLANT Left 09/25/2015   Procedure: LEFT ANKLE REMOVAL OF DEEP  IMPLANT DEBRIDEMENT OF ANTERIOR MEDIAL ANKLE JOINT;  Surgeon: Norleen Armor, MD;  Location: [facility];  Service: Orthopedics;  Laterality: Left;    TIBIA FRACTURE SURGERY      OB History     Gravida  1   Para  1   Term  1   Preterm      AB      Living  1      SAB      IAB      Ectopic      Multiple      Live Births  1            Home Medications    Prior to Admission medications  Medication Sig Start Date End Date Taking? Authorizing Provider  oseltamivir (TAMIFLU) 75 MG capsule Take 1 capsule (75 mg total) by mouth every 12 (twelve) hours. 10/04/24  Yes Merilee Andrea CROME, NP  predniSONE  (STERAPRED UNI-PAK 21 TAB) 10 MG (21) TBPK tablet Take by mouth daily. Take 6 tabs by mouth daily  for 2 days, then 5 tabs for 2 days, then 4 tabs for 2 days, then 3 tabs for 2 days, 2 tabs for 2 days, then 1 tab by mouth daily for 2 days 10/04/24  Yes Merilee Andrea L, NP  cholecalciferol (VITAMIN D ) 1000 units tablet Take 2,000 Units by mouth daily.     [provider]  ocrelizumab (OCREVUS) 300 MG/10ML injection Inject into  the vein.    [provider]    Family History Family History  Problem Relation Age of Onset   Hypertension Other    Diabetes Other    Ulcers Mother    GER disease Mother    Asthma Mother    Heart attack Mother    Lupus Mother    Cancer Father        kidney   Drug abuse Father    GER disease Sister    Asthma Brother    Birth defects Maternal Grandfather    Heart attack Maternal Grandfather    High blood pressure Maternal Grandfather    Stroke Maternal Grandfather    Autism Son    Learning disabilities Son     Social History Social History[1]   Allergies   Baclofen  and Tizanidine    Review of Systems Review of Systems  Constitutional:  Positive for fever.  HENT:  Positive for congestion and postnasal drip. Negative for sinus pressure, sinus pain, sneezing and sore throat.   Respiratory:  Positive for cough and shortness of breath.   Cardiovascular: Negative.   Gastrointestinal: Negative.   Genitourinary: Negative.   Musculoskeletal:        Generalized  body aches  Skin: Negative.  Negative for rash.  Neurological: Negative.  Negative for headaches.     Physical Exam Triage Vital Signs ED Triage Vitals [10/04/24 1038]  Encounter Vitals Group     BP 117/81     Girls Systolic BP Percentile      Girls Diastolic BP Percentile      Boys Systolic BP Percentile      Boys Diastolic BP Percentile      Pulse Rate (!) 101     Resp 16     Temp 98.1 F (36.7 C)     Temp Source Oral     SpO2 98 %     Weight      Height      Head Circumference      Peak Flow      Pain Score      Pain Loc      Pain Education      Exclude from Growth Chart    No data found.  Updated Vital Signs BP 117/81 (BP Location: Left Arm)   Pulse (!) 101   Temp 98.1 F (36.7 C) (Oral)   Resp 16   LMP 09/15/2024 (Exact Date)   SpO2 98%   Visual Acuity Right Eye Distance:   Left Eye Distance:   Bilateral Distance:    Right Eye Near:   Left Eye Near:    Bilateral Near:     Physical Exam Constitutional:      Appearance: She is ill-appearing.  HENT:     Right Ear: Tympanic membrane normal.     Left Ear: Tympanic membrane normal.     Nose: Congestion present.     Mouth/Throat:     Pharynx: No oropharyngeal exudate.  Eyes:     Pupils: Pupils are equal, round, and reactive to light.  Cardiovascular:     Rate and Rhythm: Tachycardia present.     Pulses: Normal pulses.     Heart sounds: Normal heart sounds.  Pulmonary:     Effort: Pulmonary effort is normal.     Breath sounds: Normal breath sounds.  Abdominal:     General: Abdomen is flat. Bowel sounds are normal.  Neurological:     General: No focal deficit present.     Mental  Status: She is alert.      UC Treatments / Results  Labs (all labs ordered are listed, but only abnormal results are displayed) Labs Reviewed - No data to display  EKG   Radiology No results found.  Procedures Procedures (including critical care time)  Medications Ordered in UC Medications - No data to  display  Initial Impression / Assessment and Plan / UC Course  I have reviewed the triage vital signs and the nursing notes.  Pertinent labs & imaging results that were available during my care of the patient were reviewed by me and considered in my medical decision making (see chart for details).     We will start patient on Tamiflu due to medical history of MS and a round of steroids Patient will need to follow-up with her PCP on Monday If symptoms become worse shortness of breath chest pain patient will need to follow-up in the emergency room Stay hydrated drink plenty of fluids Take Tylenol  as needed for pain or fever Cautious with over-the-counter cold and cough medicine take this only as needed  Final Clinical Impressions(s) / UC Diagnoses   Final diagnoses:  Influenza  Acute cough  Body aches     Discharge Instructions      Patient will need to follow-up with her PCP on Monday If symptoms become worse shortness of breath chest pain patient will need to follow-up in the emergency room Stay hydrated drink plenty of fluids Take Tylenol  as needed for pain or fever Cautious with over-the-counter cold and cough medicine take this only as needed     ED Prescriptions     Medication Sig Dispense Auth. Provider   oseltamivir (TAMIFLU) 75 MG capsule Take 1 capsule (75 mg total) by mouth every 12 (twelve) hours. 10 capsule Merilee Hollering L, NP   predniSONE  (STERAPRED UNI-PAK 21 TAB) 10 MG (21) TBPK tablet Take by mouth daily. Take 6 tabs by mouth daily  for 2 days, then 5 tabs for 2 days, then 4 tabs for 2 days, then 3 tabs for 2 days, 2 tabs for 2 days, then 1 tab by mouth daily for 2 days 42 tablet Merilee Hollering CROME, NP      PDMP not reviewed this encounter.    [1]  Social History Tobacco Use   Smoking status: Every Day    Current packs/day: 0.25    Average packs/day: 0.3 packs/day for 2.0 years (0.5 ttl pk-yrs)    Types: Cigarettes   Smokeless tobacco: Never    Tobacco comments:    quit with preg  Vaping Use   Vaping status: Never Used  Substance Use Topics   Alcohol use: Not Currently   Drug use: Yes    Types: Marijuana    Comment: daily use     Merilee Hollering CROME, NP 10/04/24 1147  "
